# Patient Record
Sex: Female | Born: 1950 | Race: White | Hispanic: No | Marital: Married | Smoking: Never smoker
Health system: Southern US, Community
[De-identification: ages and names within clinical notes are randomized; demographics above are authoritative.]

## PROBLEM LIST (undated history)

## (undated) DIAGNOSIS — E039 Hypothyroidism, unspecified: Secondary | ICD-10-CM

## (undated) DIAGNOSIS — E785 Hyperlipidemia, unspecified: Secondary | ICD-10-CM

## (undated) DIAGNOSIS — K219 Gastro-esophageal reflux disease without esophagitis: Secondary | ICD-10-CM

## (undated) DIAGNOSIS — M1990 Unspecified osteoarthritis, unspecified site: Secondary | ICD-10-CM

## (undated) HISTORY — PX: COLONOSCOPY: SHX174

---

## 2003-10-18 IMAGING — MG MM ADDITIONAL VIEWS AT NO CHARGE
1 series · 3 of 3 positions shown · non-contrast
Comparison: none

Procedure: DIGITAL ADDITIONAL IMAGING OF THE LEFT BREAST:

 Magnification compression imaging of the LEFT breast was performed in the
region of the nodular density which projects within the inferior central
portion of the LEFT breast in approximately the 5 to 7 oclock position.
This area does not persist with magnification compression imaging consistent
with a benign finding.

[L ML · left · 3 of 3 slices shown]
[im 1/3]
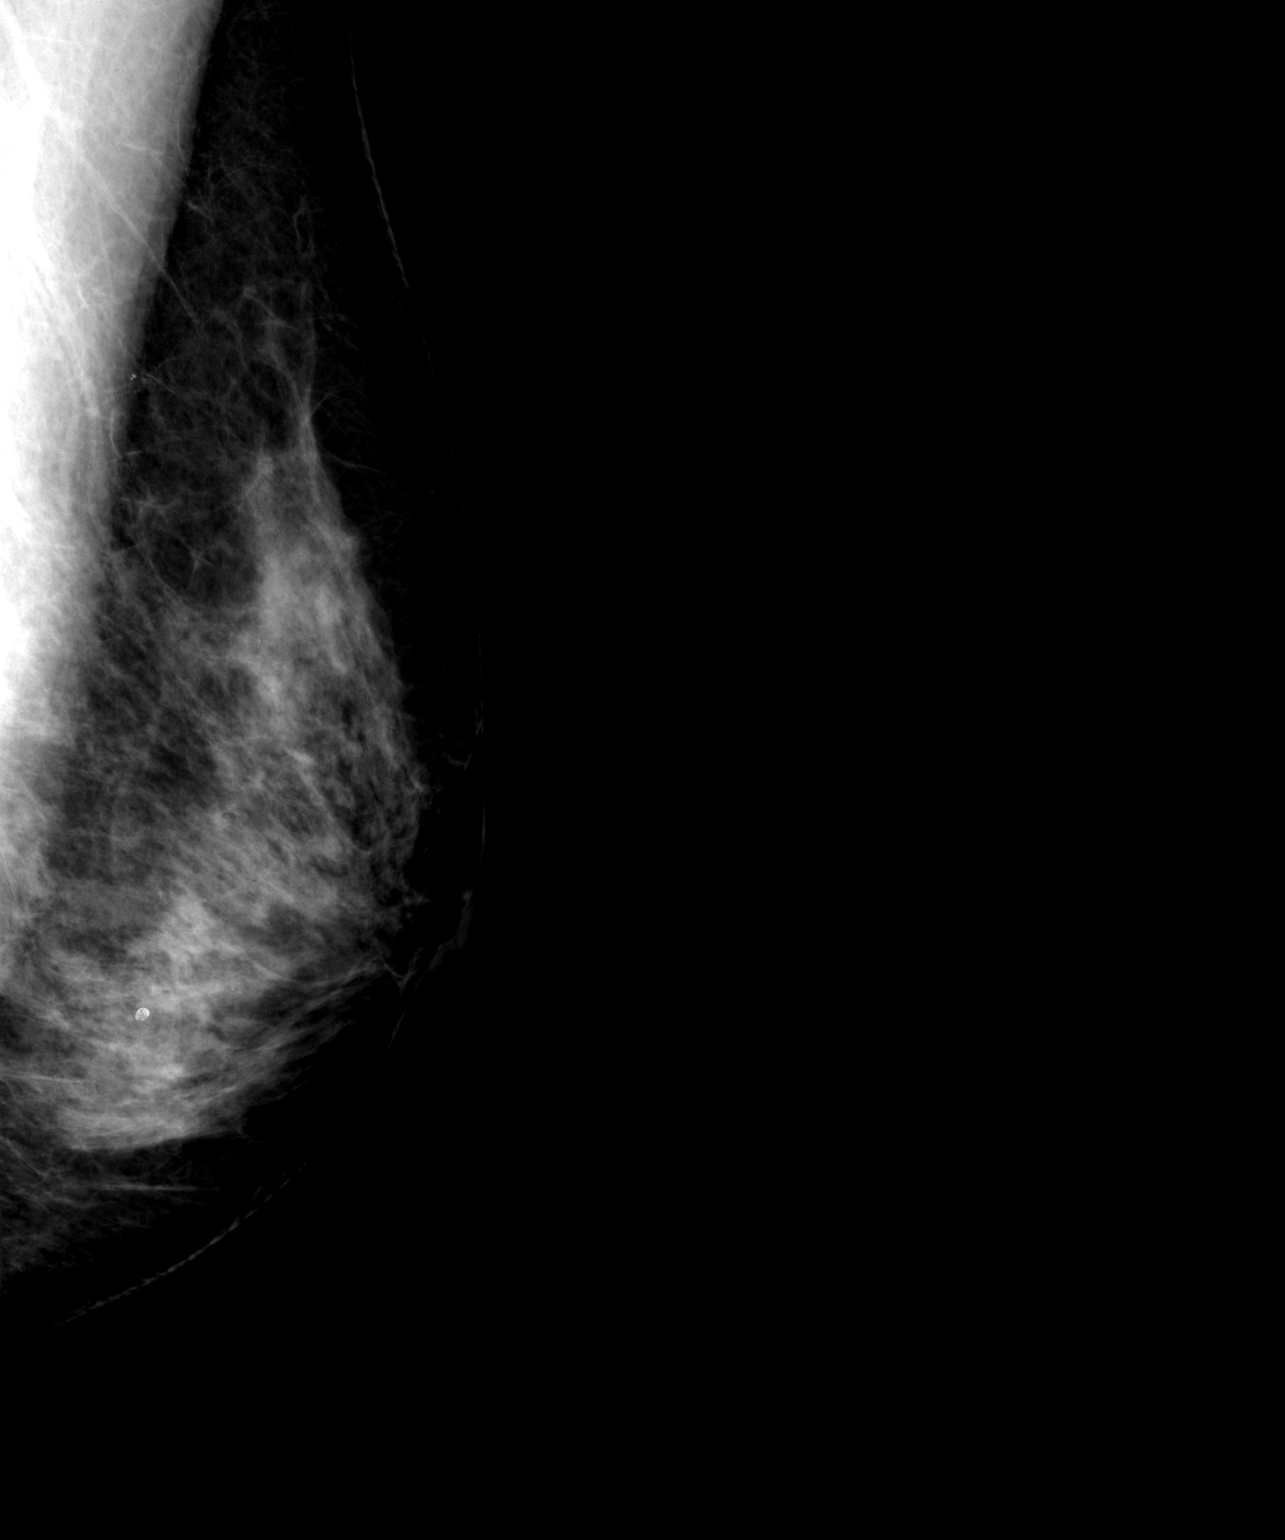
[im 2/3]
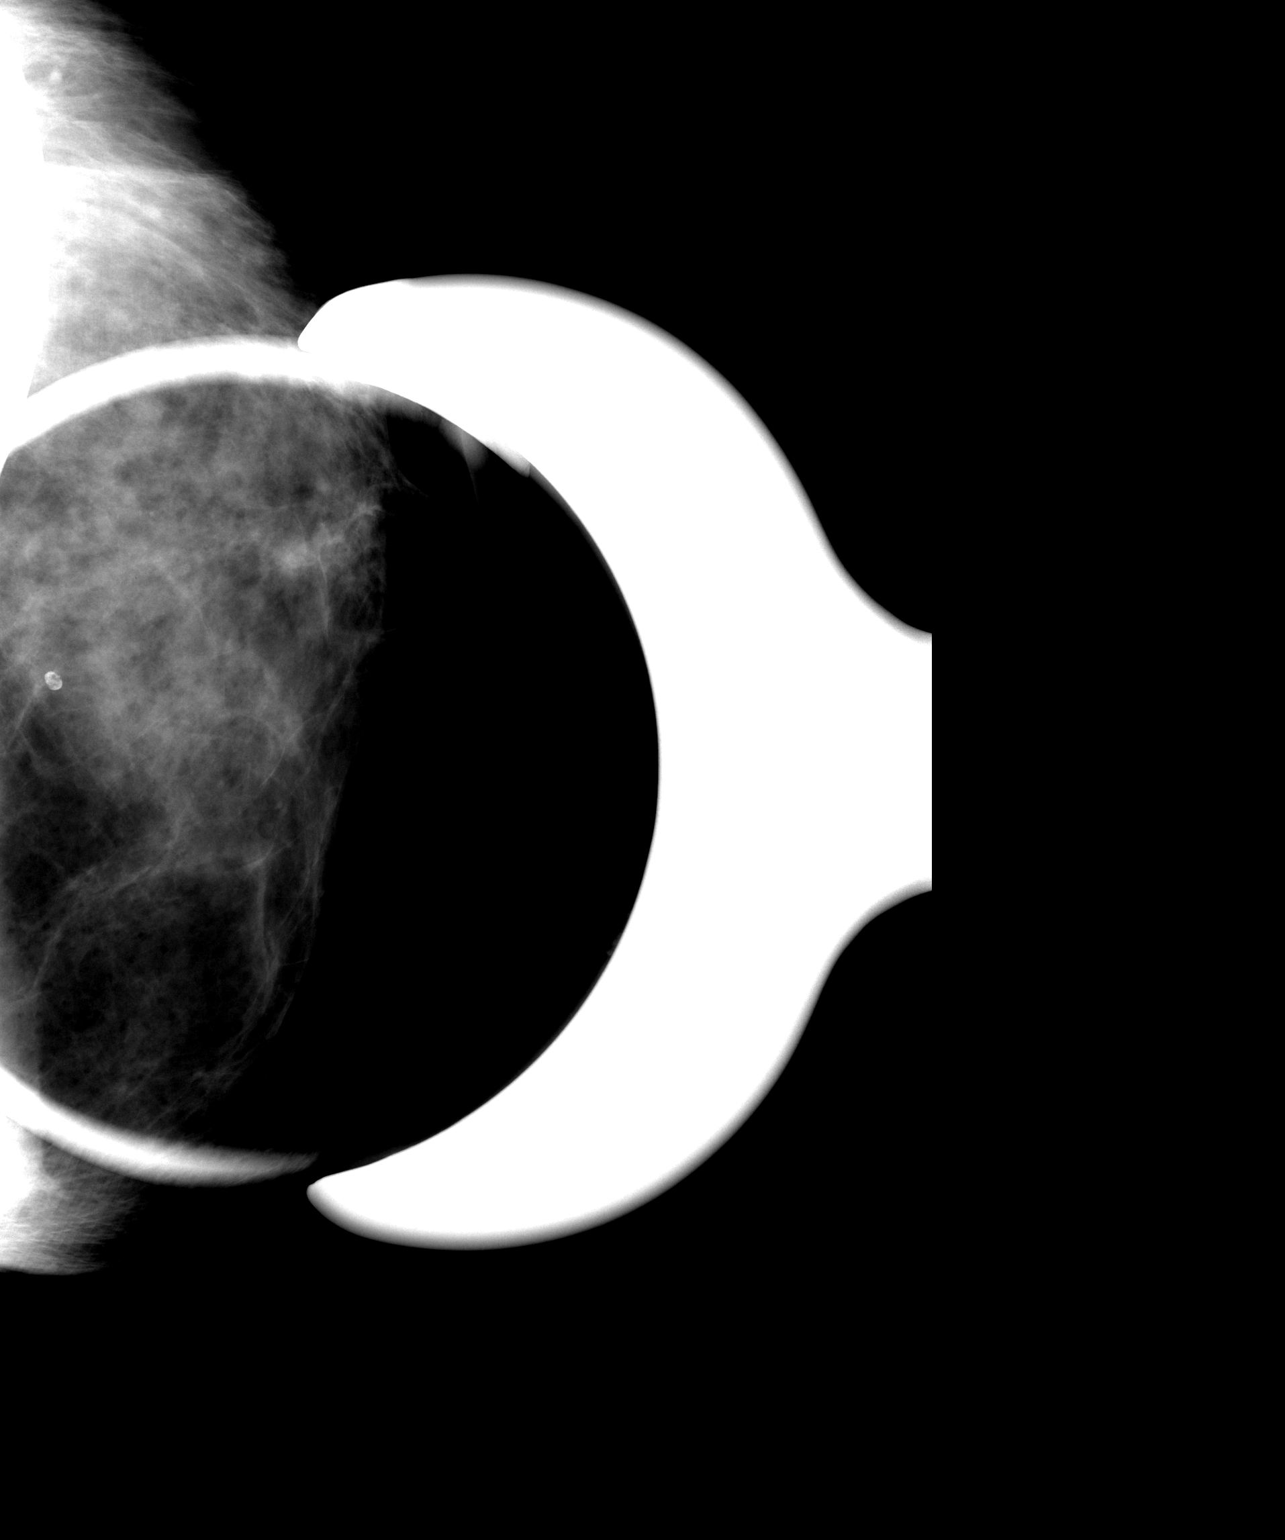
[im 3/3]
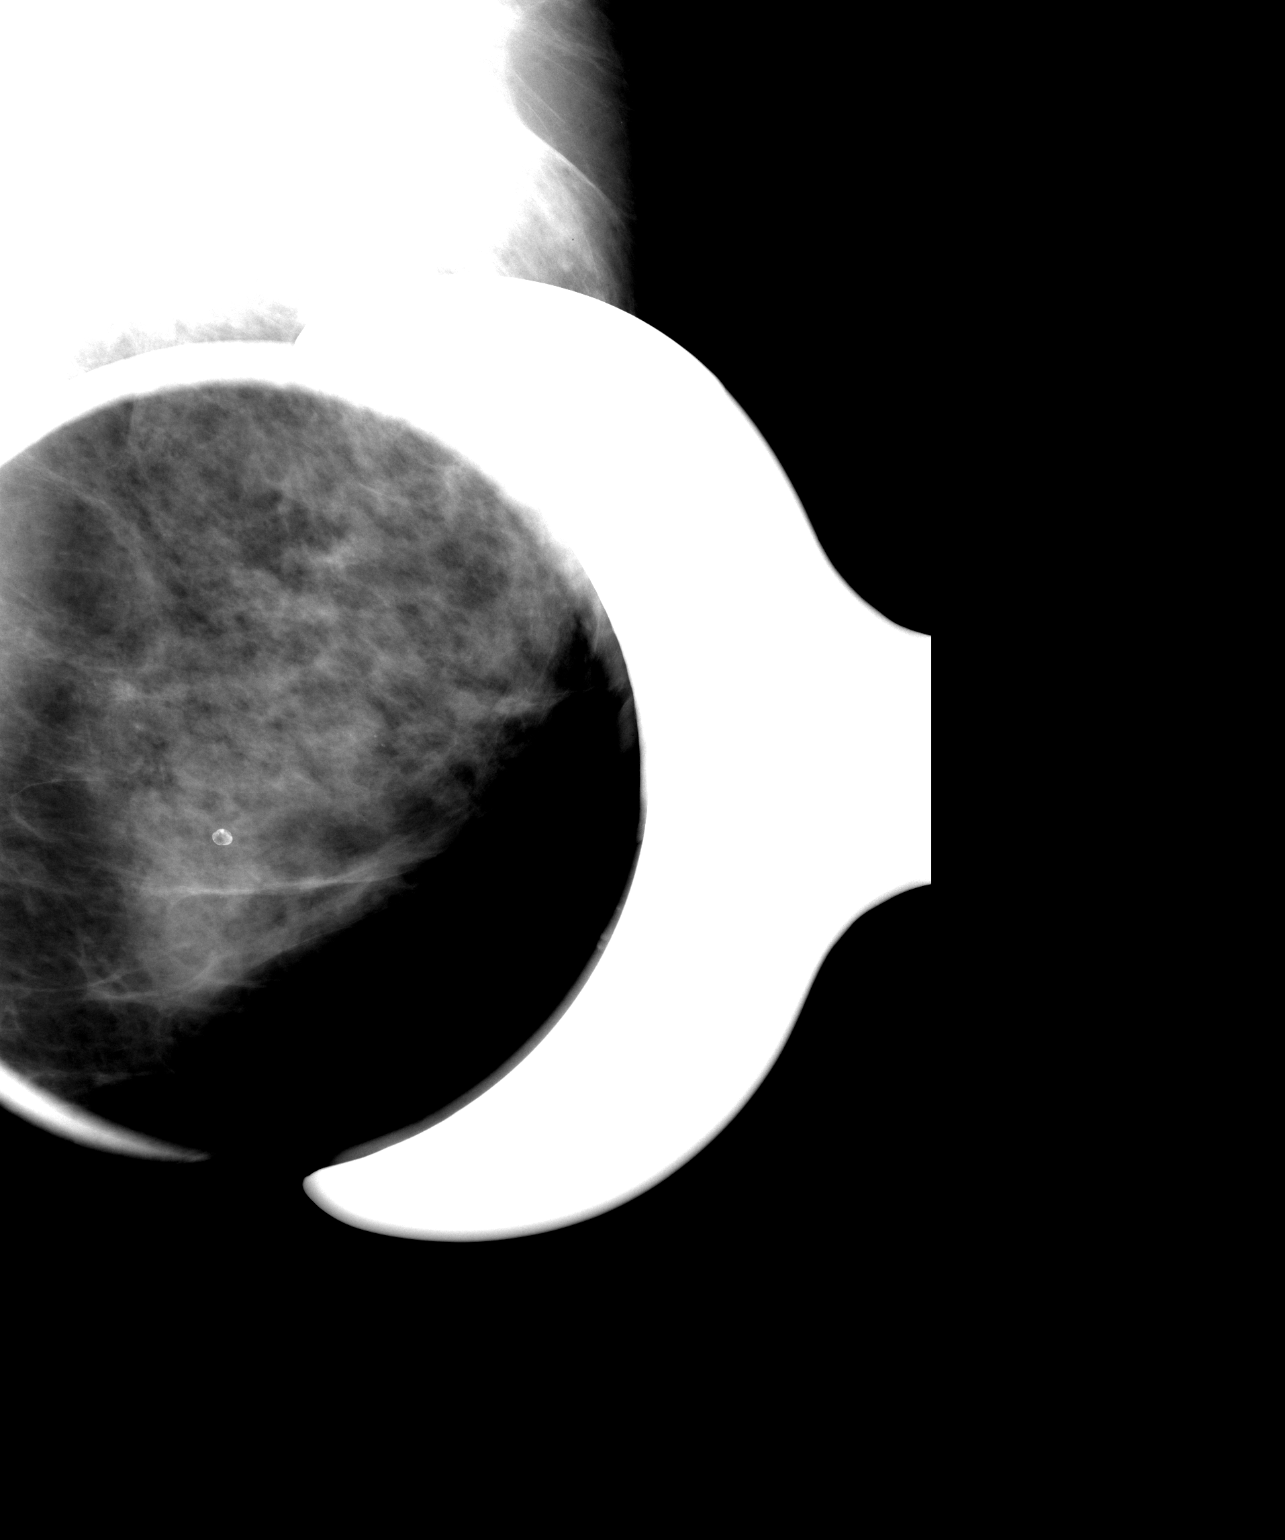

[3 of 3 positions shown; findings below may reference images not displayed]

IMPRESSION: 1)Benign findings.

 2)BI-RADS: Category 2-Benign Finding

 A NEGATIVE MAMMOGRAM REPORT DOES NOT PRECLUDE BIOPSY OR OTHER EVALUATION OF
A CLINICALLY PALPABLE OR OTHERWISE SUSPICIOUS MASS OR LESION. BREAST CANCER
MAY NOT BE DETECTED BY MAMMOGRAPHY IN UP TO 10% OF CASES.

## 2004-11-12 ENCOUNTER — Ambulatory Visit: Payer: Self-pay | Admitting: Family Medicine

## 2004-11-12 IMAGING — MG MM CAD SCREENING MAMMO
1 series · 4 of 4 positions shown · non-contrast
Comparison: none

REASON FOR EXAM: screening
COMMENTS:

[R CC · right · 4 of 4 slices shown]
[im 1/4]
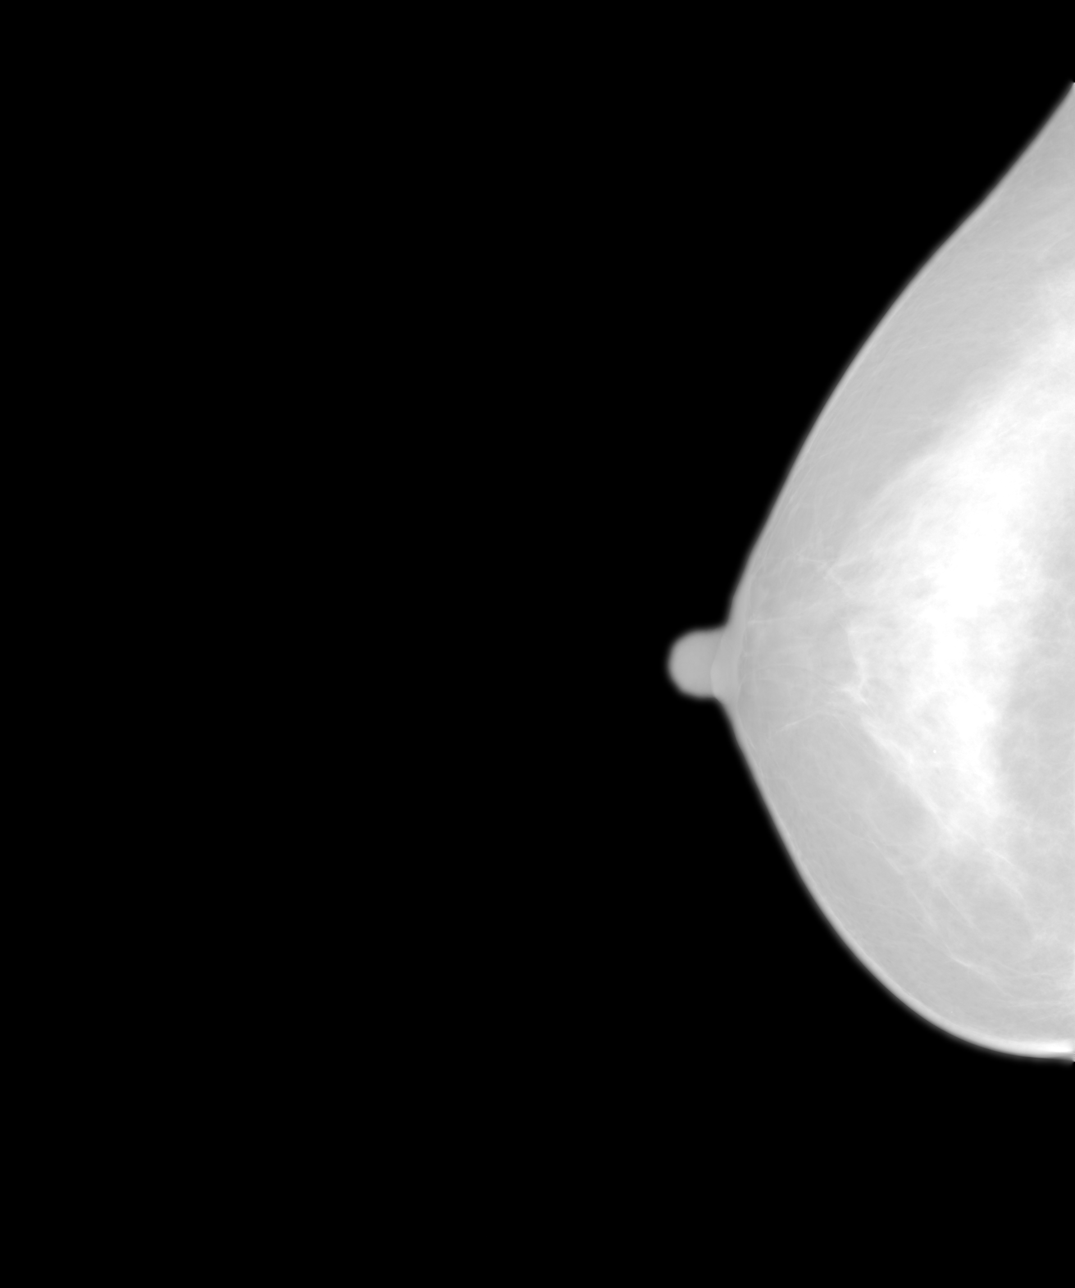
[im 2/4]
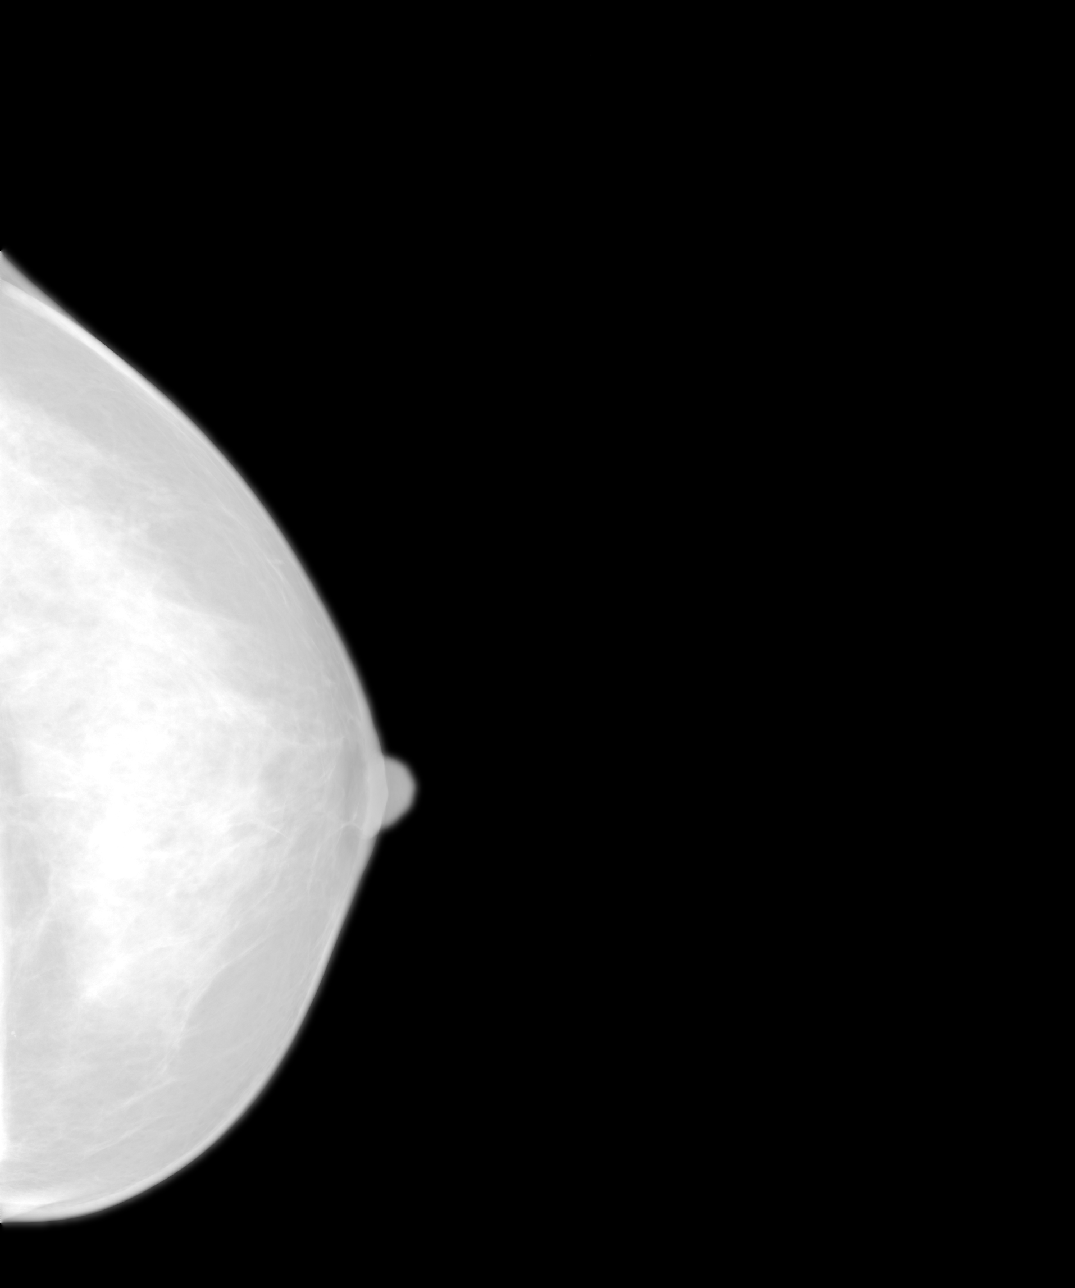
[im 3/4]
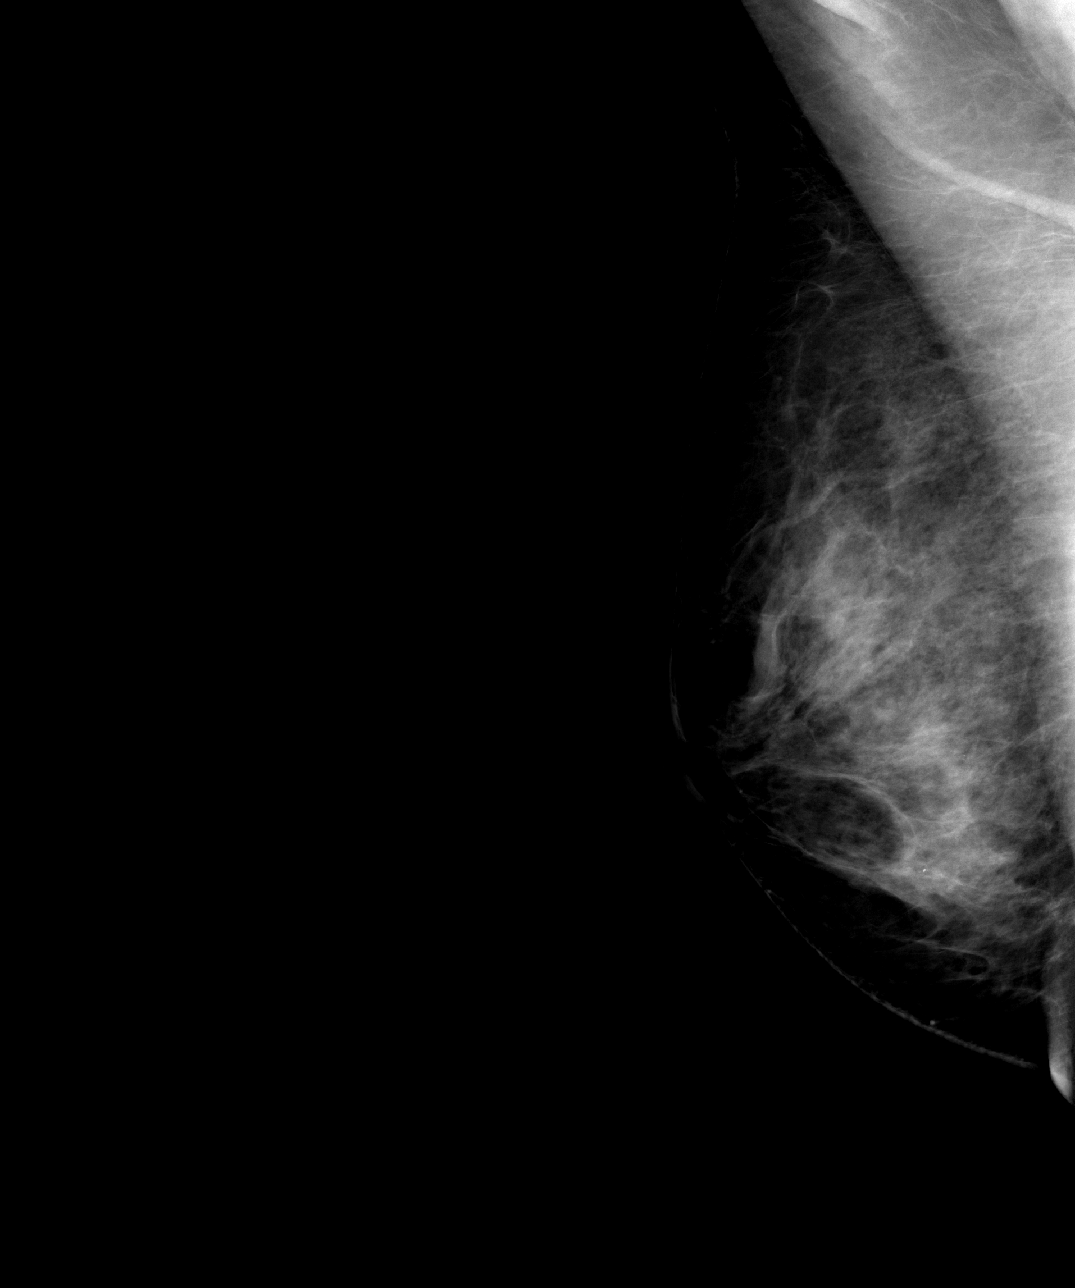
[im 4/4]
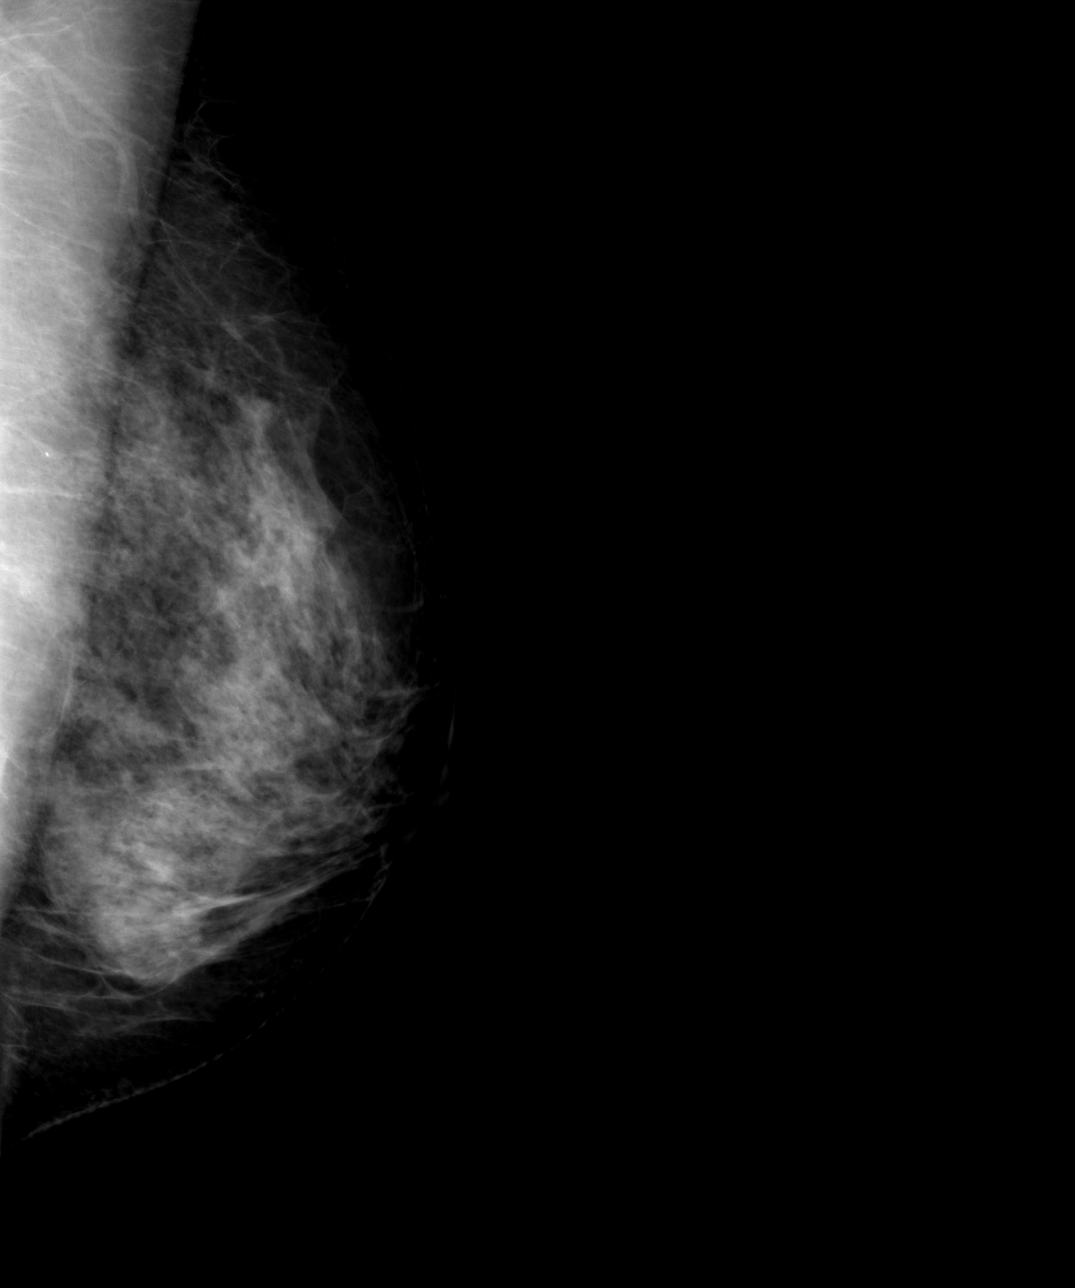

[4 of 4 positions shown; findings below may reference images not displayed]

PROCEDURE:     MAM - MAM DGTL SCREENING MAMMO W/CAD  - [DATE]  [DATE]

RESULT:       Routine images were obtained and compared to prior digital
exam of [DATE] and film screen of [DATE].

The breasts appear relatively dense bilaterally.  No dominant masses are
noted.  No suspicious microcalcifications are seen.  No areas of
architectural distortion are noted.
IMPRESSION: 1.     The breasts are stable.
2.     Continued annual follow up recommended.
3.     BI-RADS: Category 2-Benign Findings.

A NEGATIVE MAMMOGRAM REPORT DOES NOT PRECLUDE BIOPSY OR OTHER EVALUATION OF
A CLINICALLY PALPABLE OR OTHERWISE SUSPICIOUS MASS OR LESION.   BREAST

## 2006-06-16 ENCOUNTER — Ambulatory Visit: Payer: Self-pay | Admitting: Family Medicine

## 2006-06-16 IMAGING — MG MM CAD SCREENING MAMMO
1 series · 4 of 4 positions shown · non-contrast
Comparison: none

REASON FOR EXAM: Screening mammogram
COMMENTS:

PROCEDURE:     MAM - MAM DGTL SCREENING MAMMO W/CAD  - [DATE]  [DATE]
RESULT:        Comparison is made to prior studies dated [DATE] and
[DATE].
The breasts demonstrate a moderate heterogeneous parenchymal pattern.  There
does not appear to be radiographic evidence to suggest malignancy.

[R CC · right · 4 of 4 slices shown]
[im 1/4]
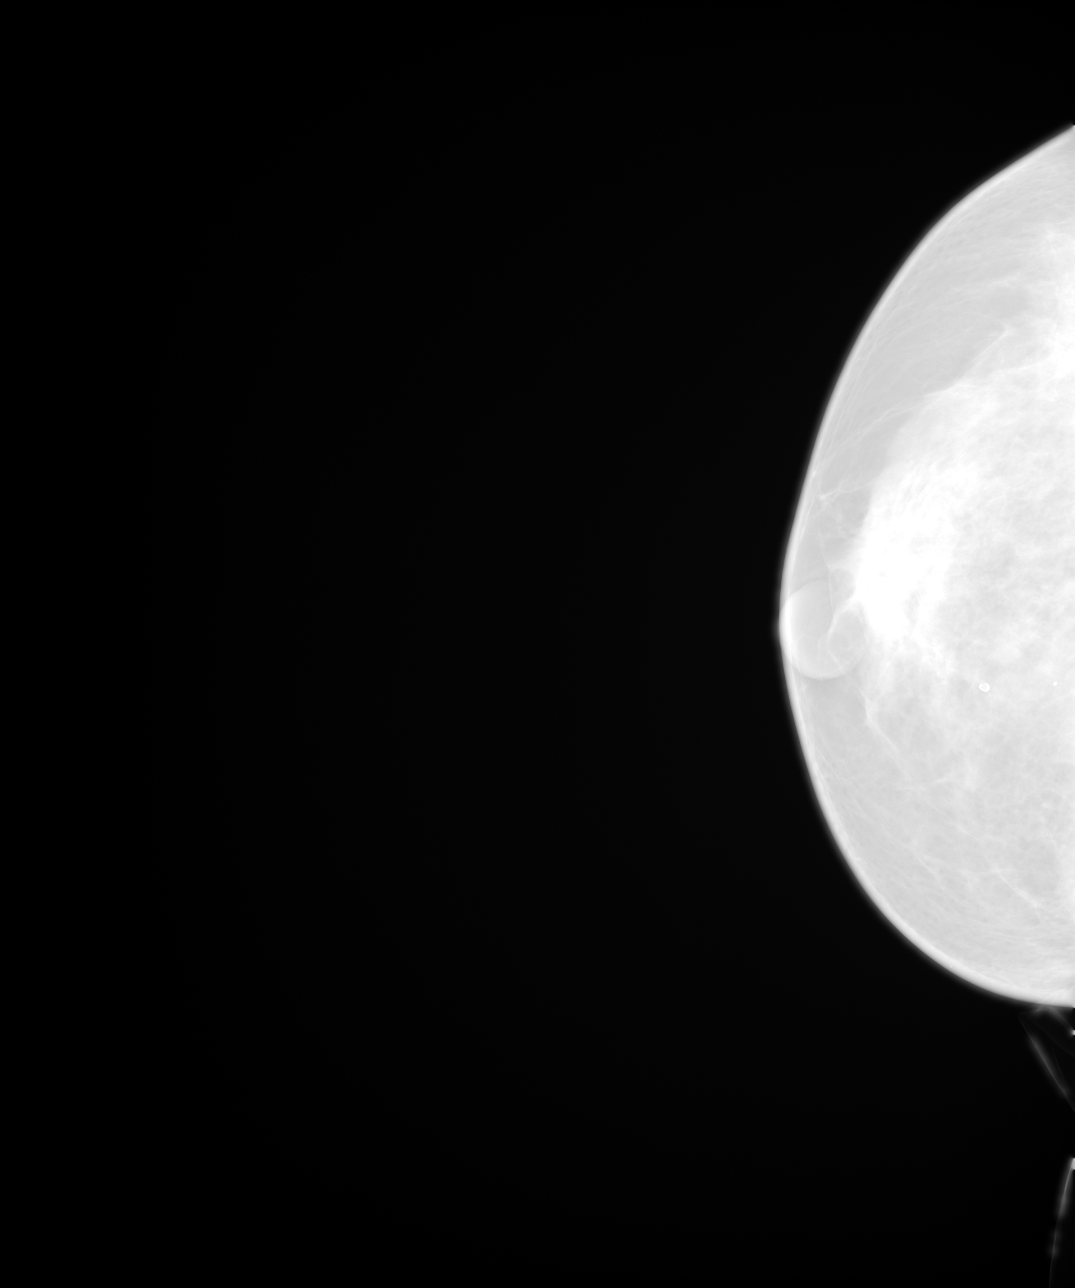
[im 2/4]
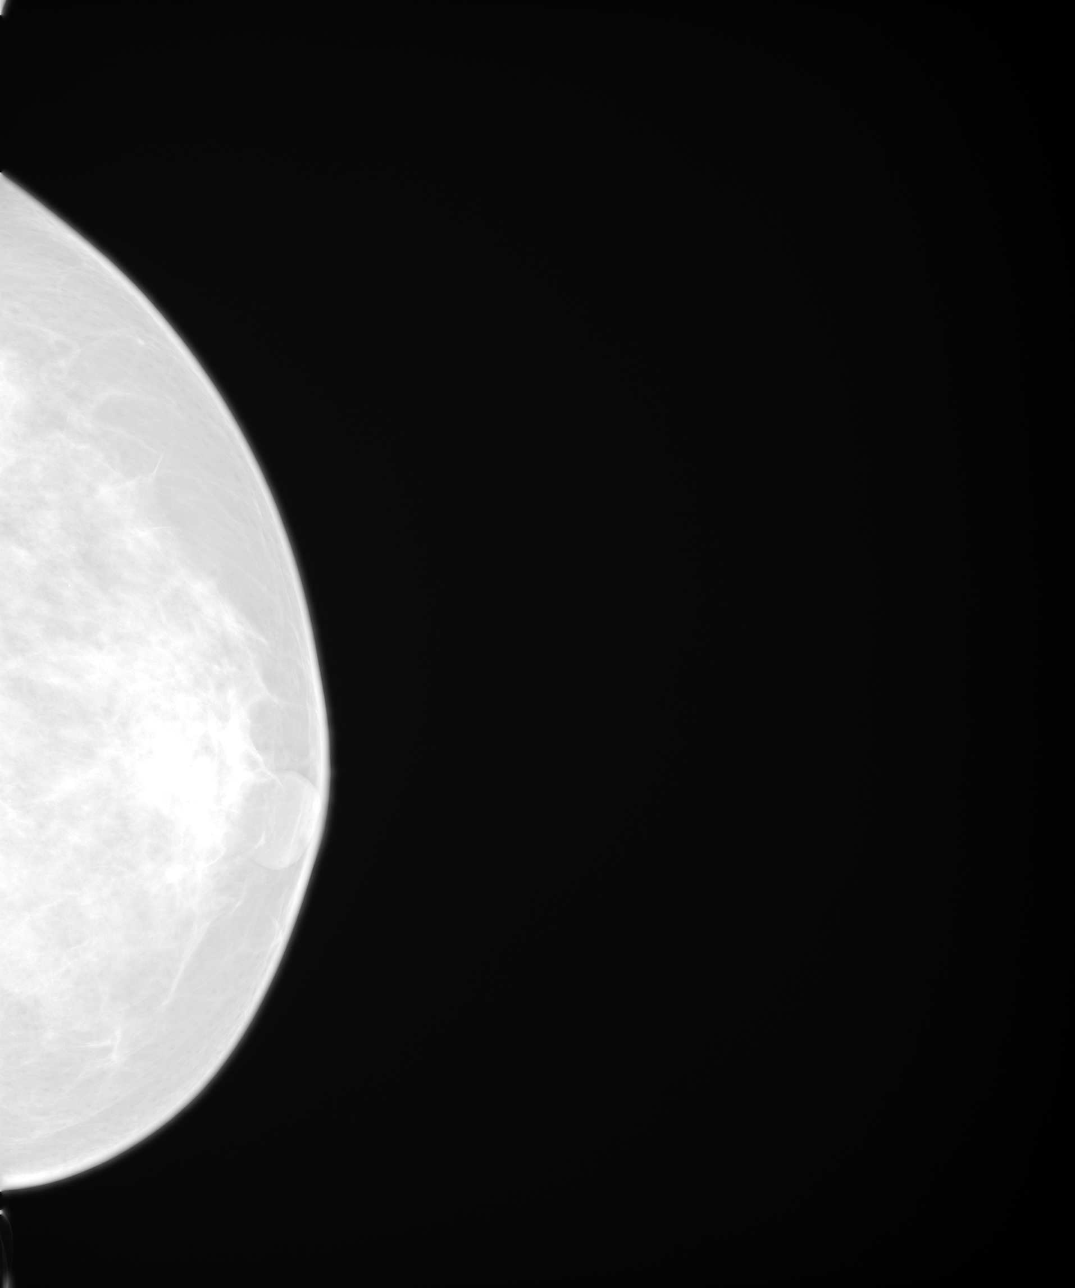
[im 3/4]
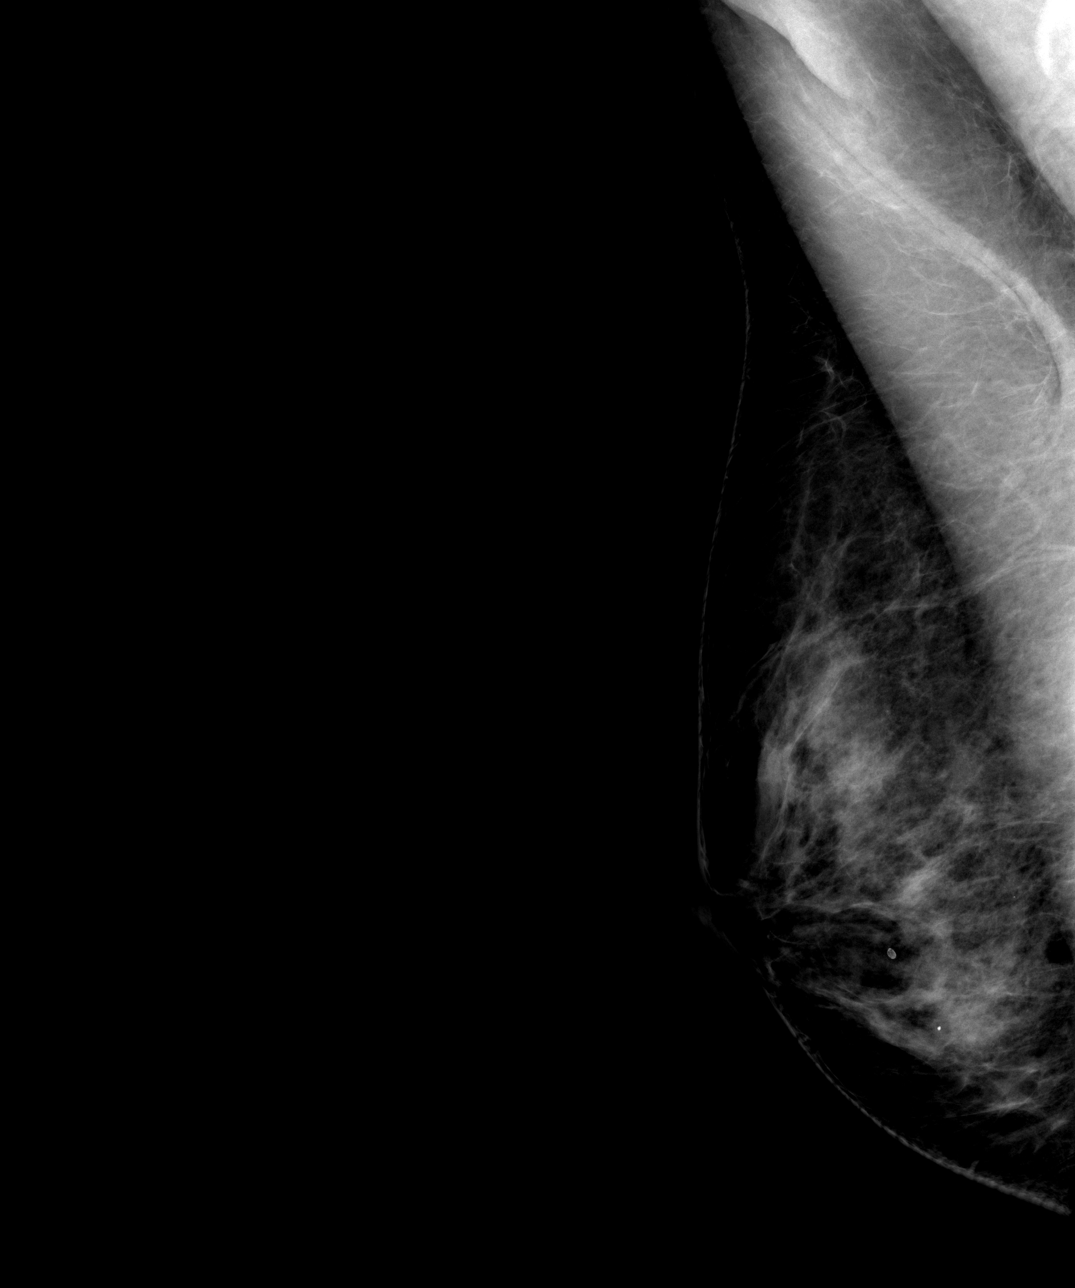
[im 4/4]
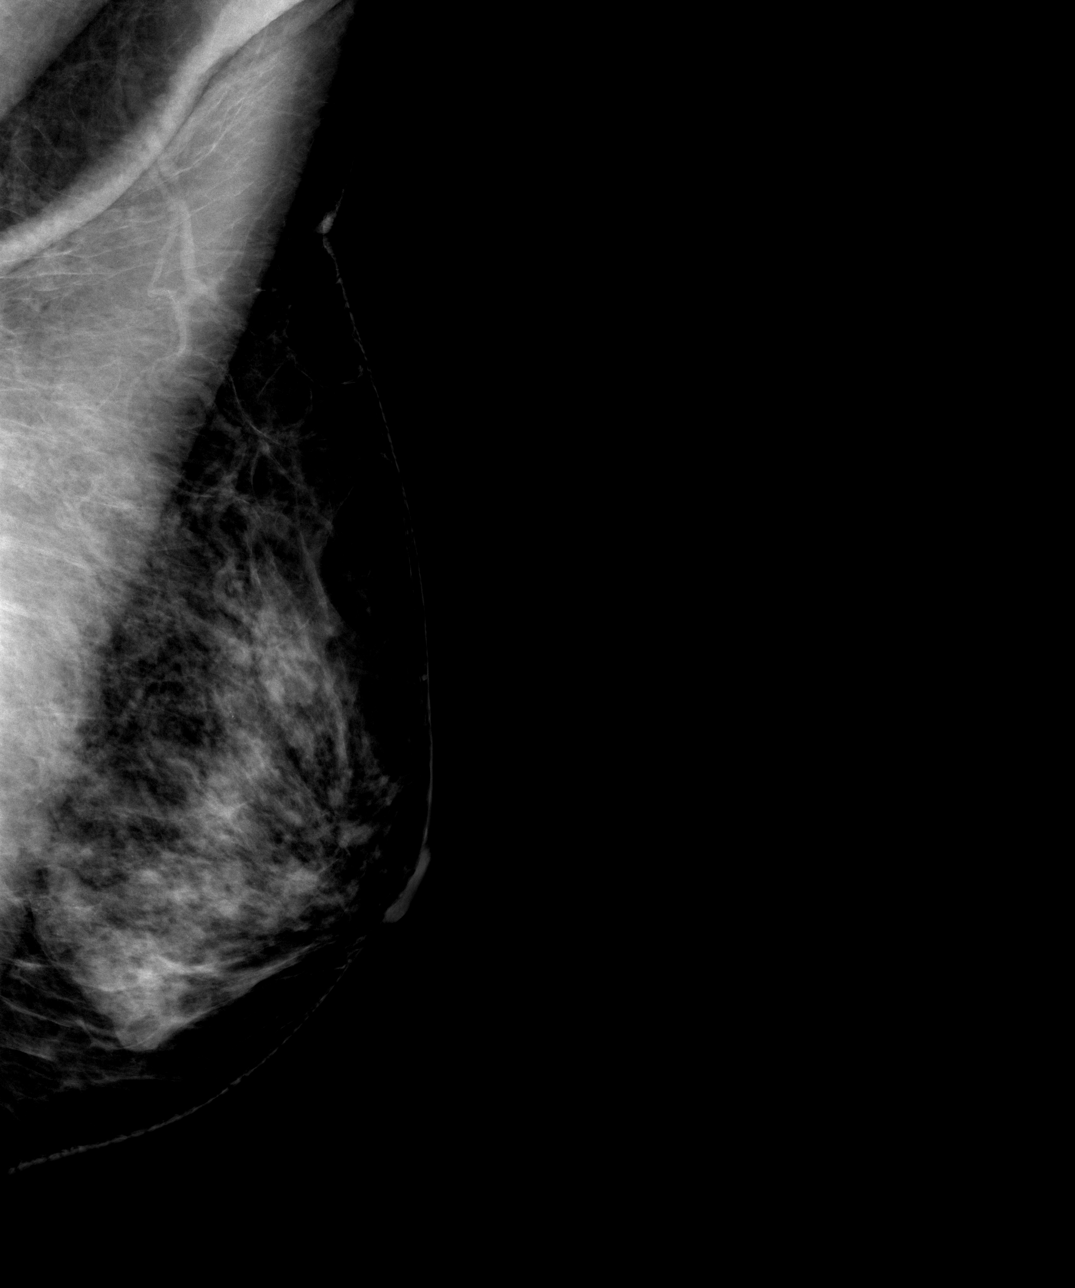

[4 of 4 positions shown; findings below may reference images not displayed]

IMPRESSION: 1.     Benign findings.
2.     BI-RADS:  Category 2- Benign Finding.

A NEGATIVE MAMMOGRAM REPORT DOES NOT PRECLUDE BIOPSY OR OTHER EVALUATION OF
A CLINICALLY PALPABLE OR OTHERWISE SUSPICOUS MASS OR LESION.  BREAST CANCER
MAY NOT BE DETECTED BY MAMMOGRAPHY IN UP TO 10% OF CASES.

## 2007-10-10 ENCOUNTER — Ambulatory Visit: Payer: Self-pay | Admitting: Family Medicine

## 2007-10-10 IMAGING — MG MM CAD SCREENING MAMMO
1 series · 4 of 4 positions shown · non-contrast
Comparison: none

REASON FOR EXAM: Screening mammogram
COMMENTS:

PROCEDURE:     MAM - MAM DGTL SCREENING MAMMO W/CAD  - [DATE]  [DATE]
RESULT:     Comparison is made to prior exams of [DATE] and [DATE].

[R CC · right · 4 of 4 slices shown]
[im 1/4]
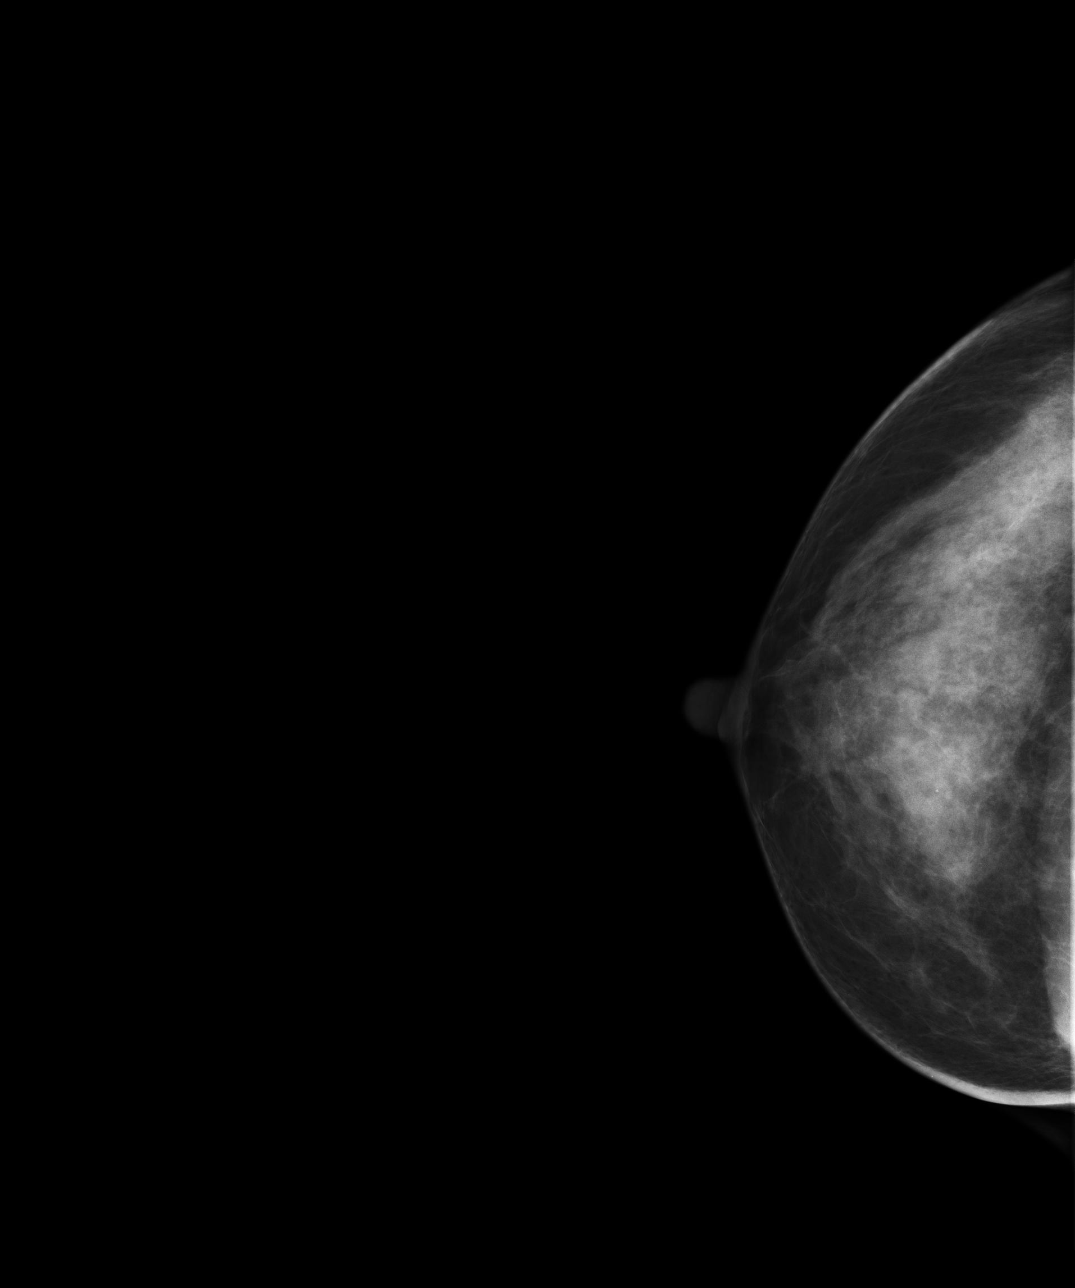
[im 2/4]
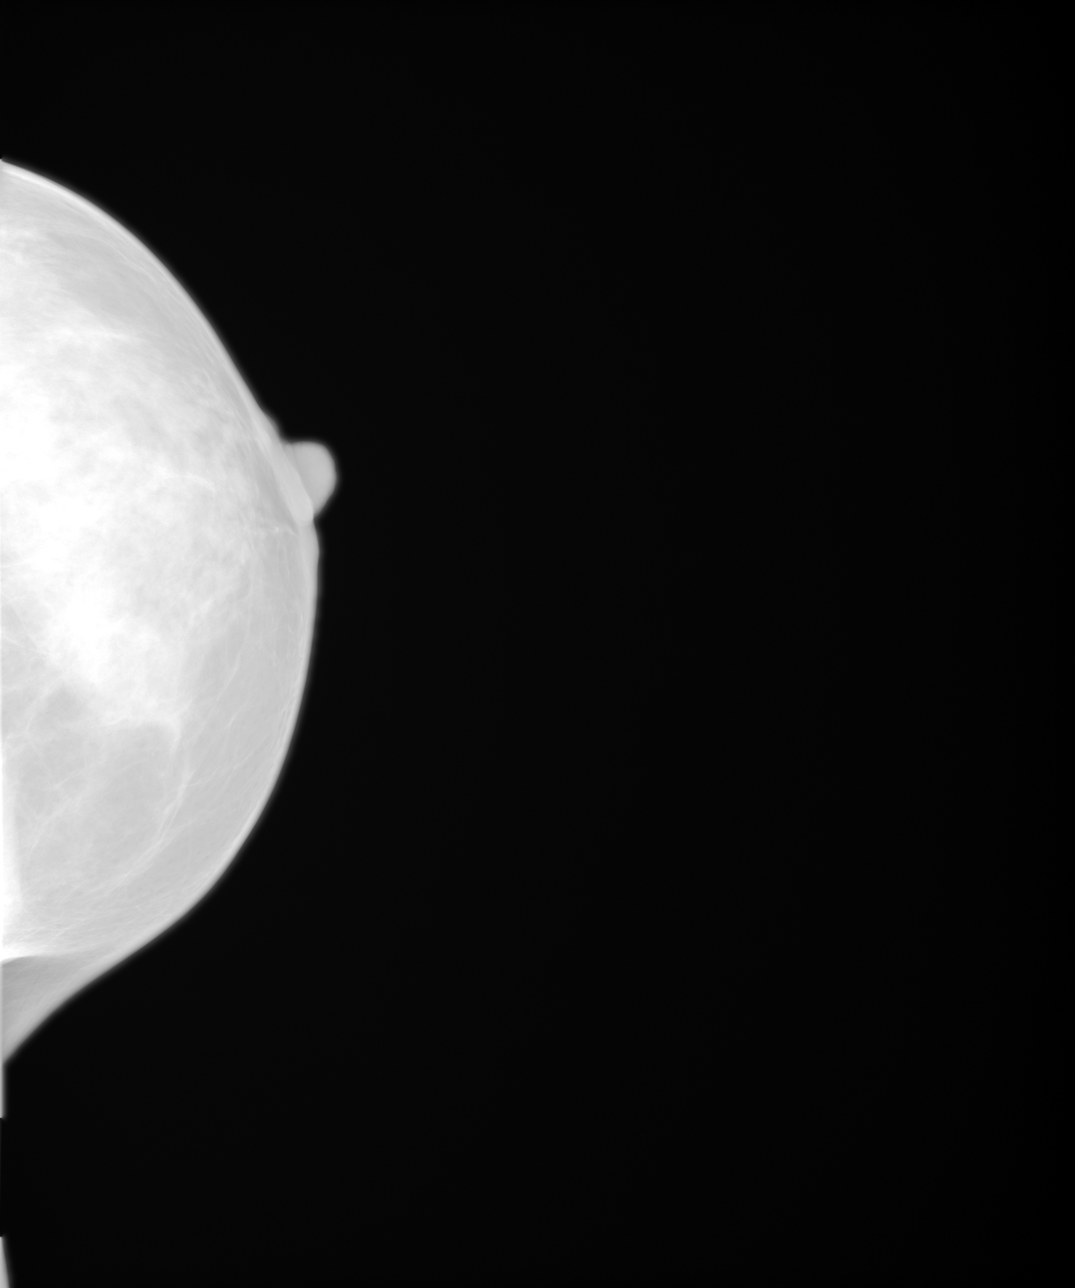
[im 3/4]
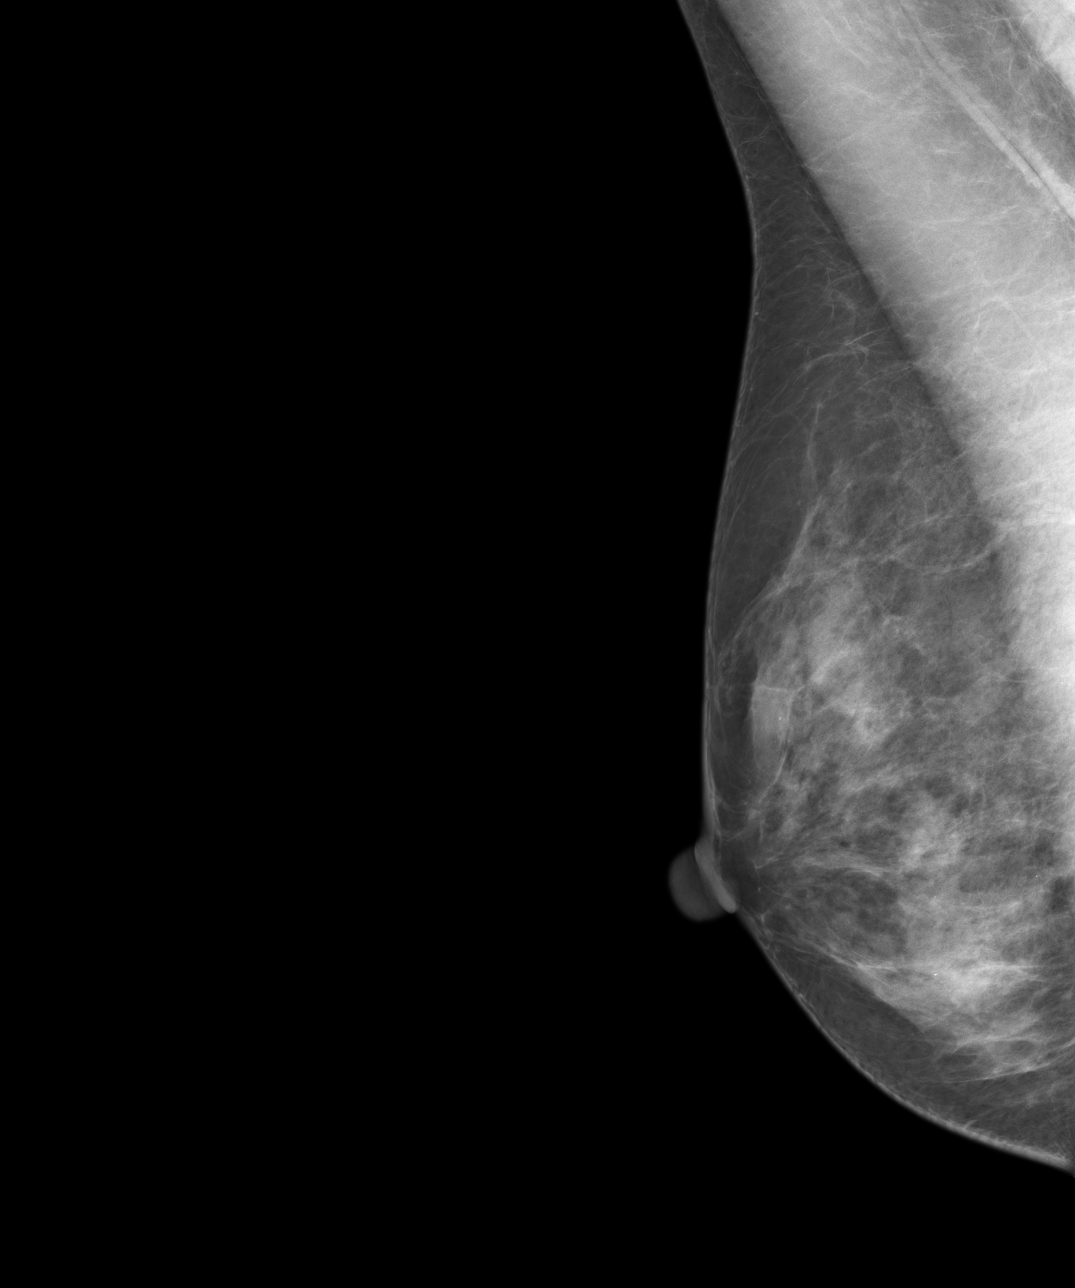
[im 4/4]
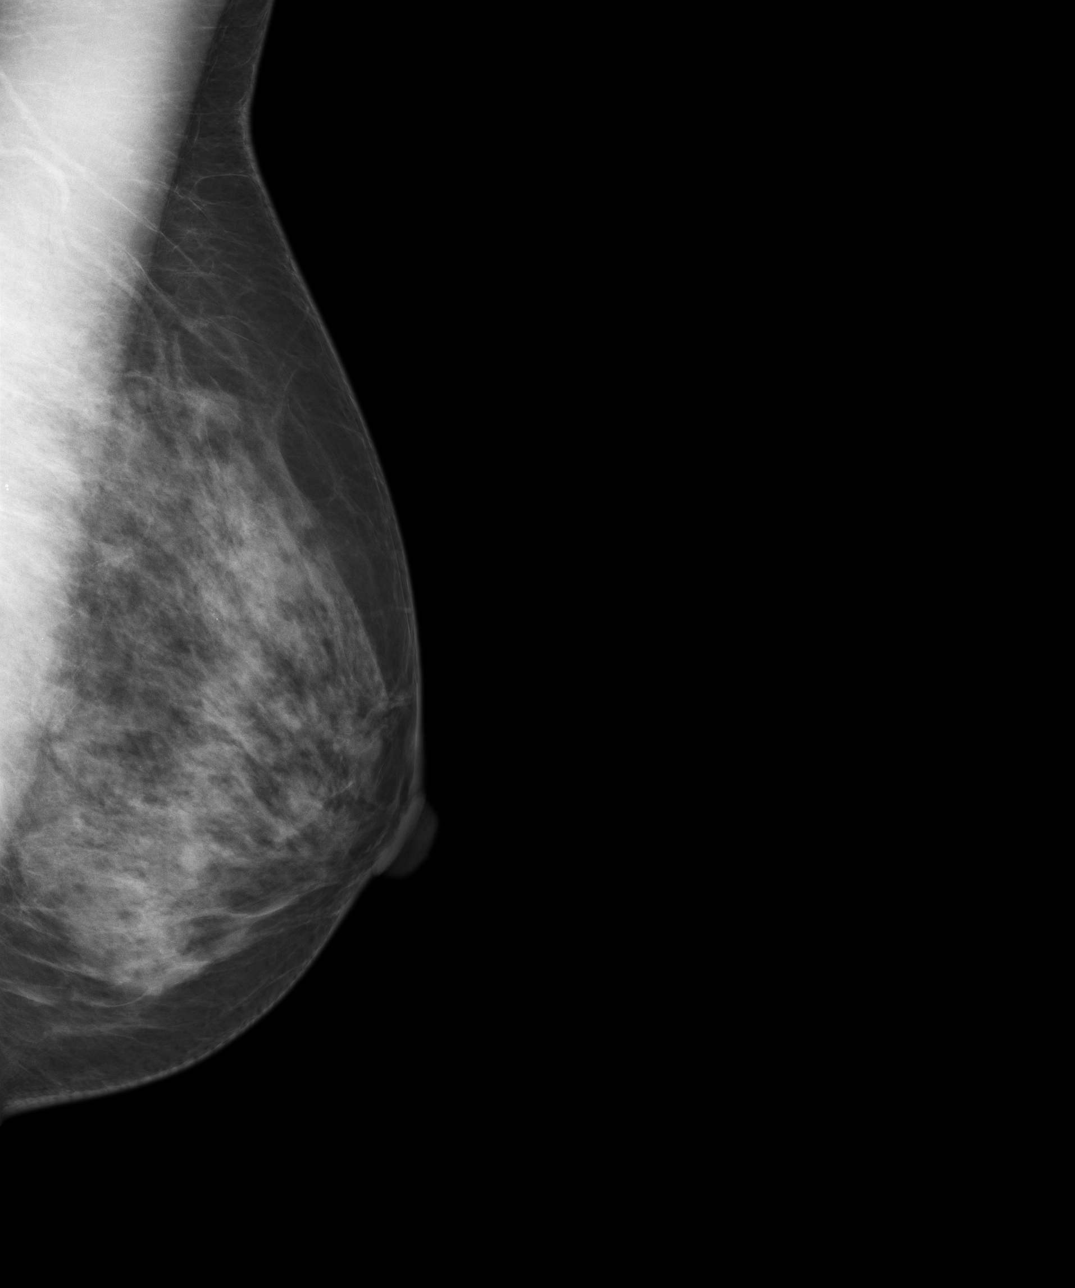

[4 of 4 positions shown; findings below may reference images not displayed]

FINDINGS: Heterogeneously dense bilateral breast parenchyma lowers
sensitivity for mammography. A few, scattered, benign appearing bilateral
breast calcifications are noted. No new suspicious calcifications or
suspicious mass.
IMPRESSION: 1.     Benign findings.
2.     Recommend routine follow-up bilateral mammogram in one year.
3.     BI-RADS: Category 2 - Benign Finding.

Thank you for this opportunity to contribute to the care of your patient.

A NEGATIVE MAMMOGRAM REPORT DOES NOT PRECLUDE BIOPSY OR OTHER EVALUATION OF
A CLINICALLY PALPABLE OR OTHERWISE SUSPICIOUS MASS OR LESION. BREAST CANCER
MAY NOT BE DETECTED BY MAMMOGRAPHY IN UP TO 10% OF CASES.

## 2008-12-19 ENCOUNTER — Ambulatory Visit: Payer: Self-pay | Admitting: Family Medicine

## 2008-12-19 IMAGING — MG MM CAD SCREENING MAMMO
1 series · 4 of 4 positions shown · non-contrast
Comparison: none

REASON FOR EXAM: scr mammo
COMMENTS:

[R CC · right · 4 of 4 slices shown]
[im 1/4]
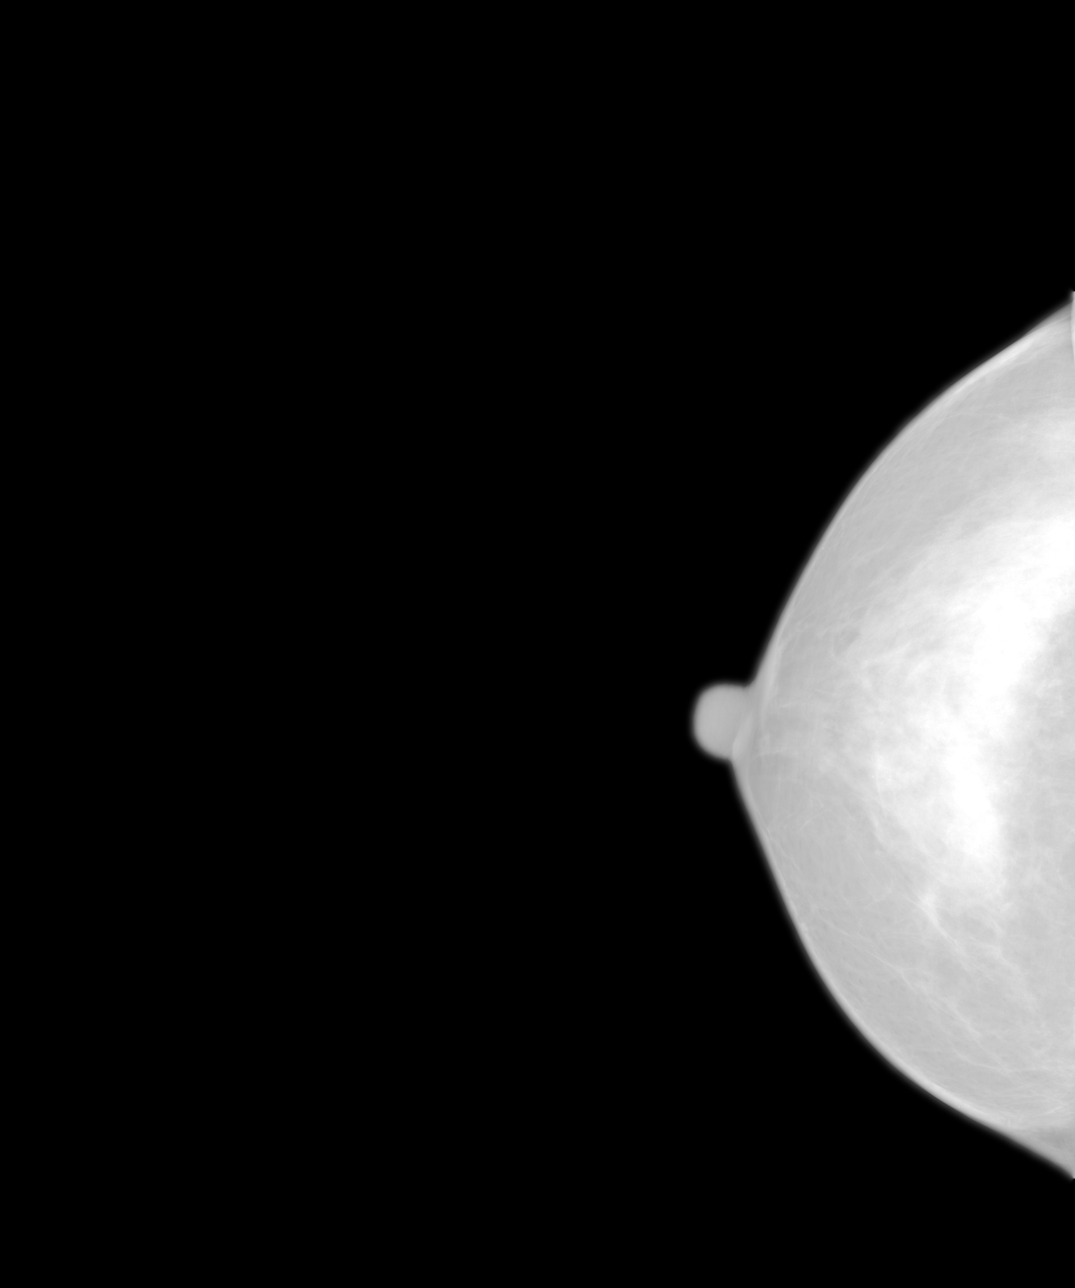
[im 2/4]
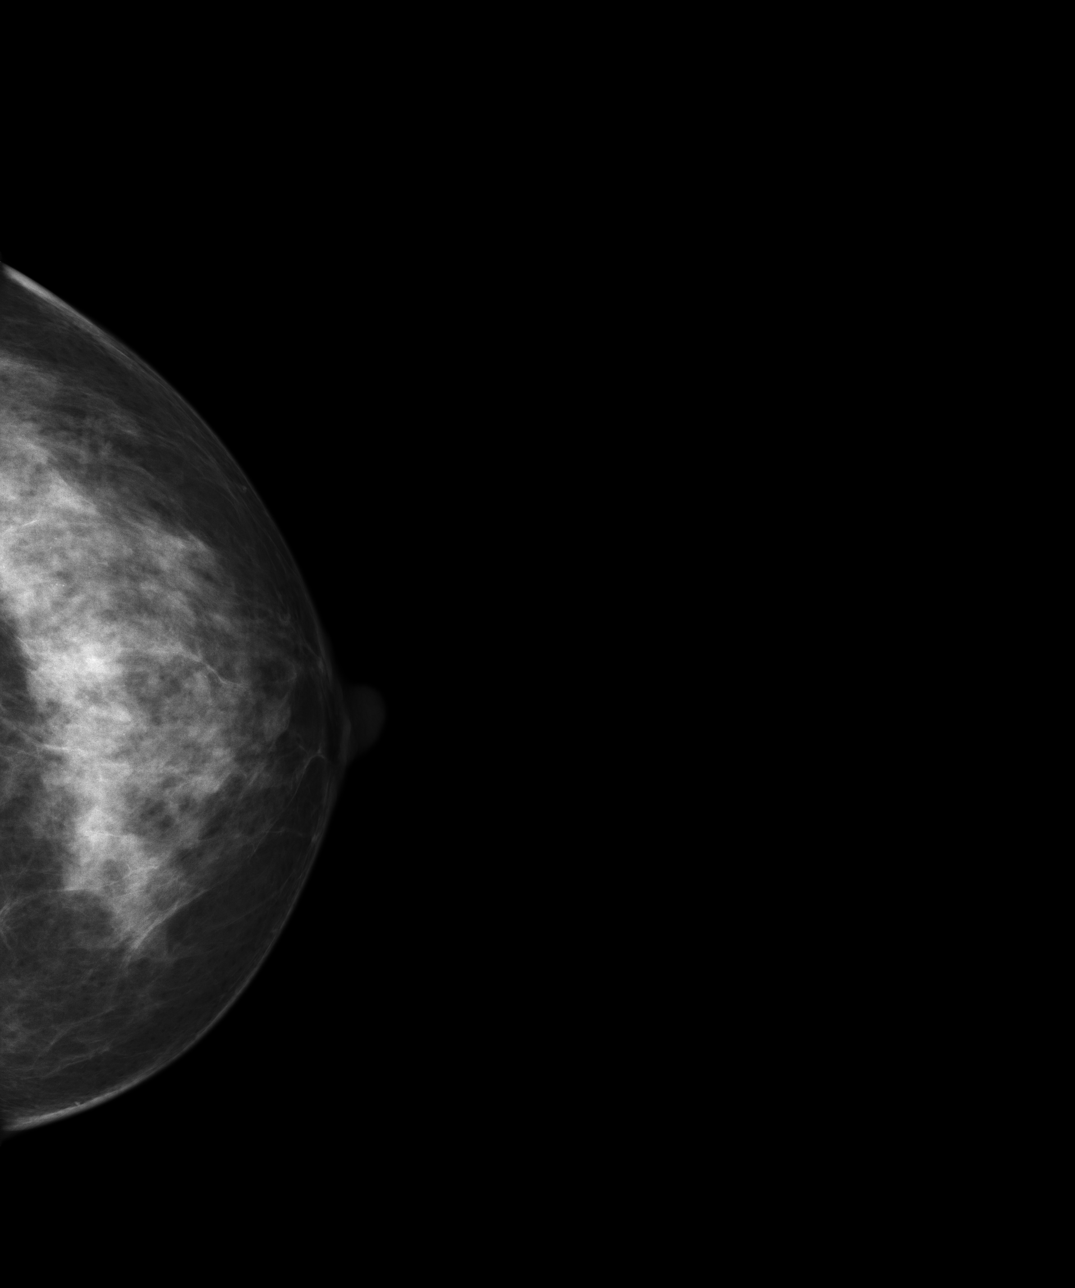
[im 3/4]
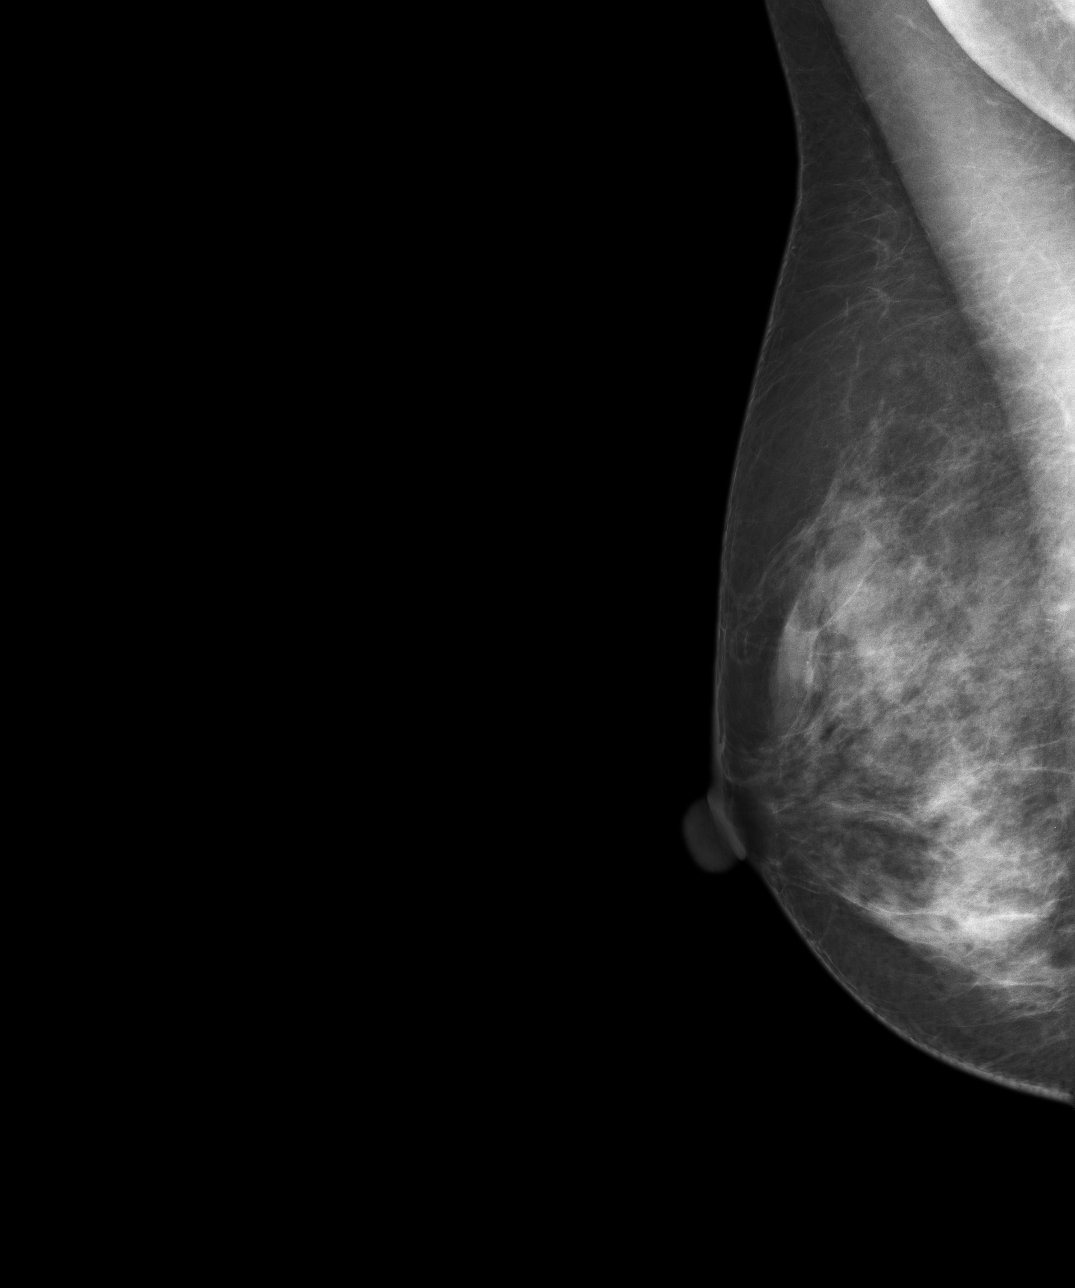
[im 4/4]
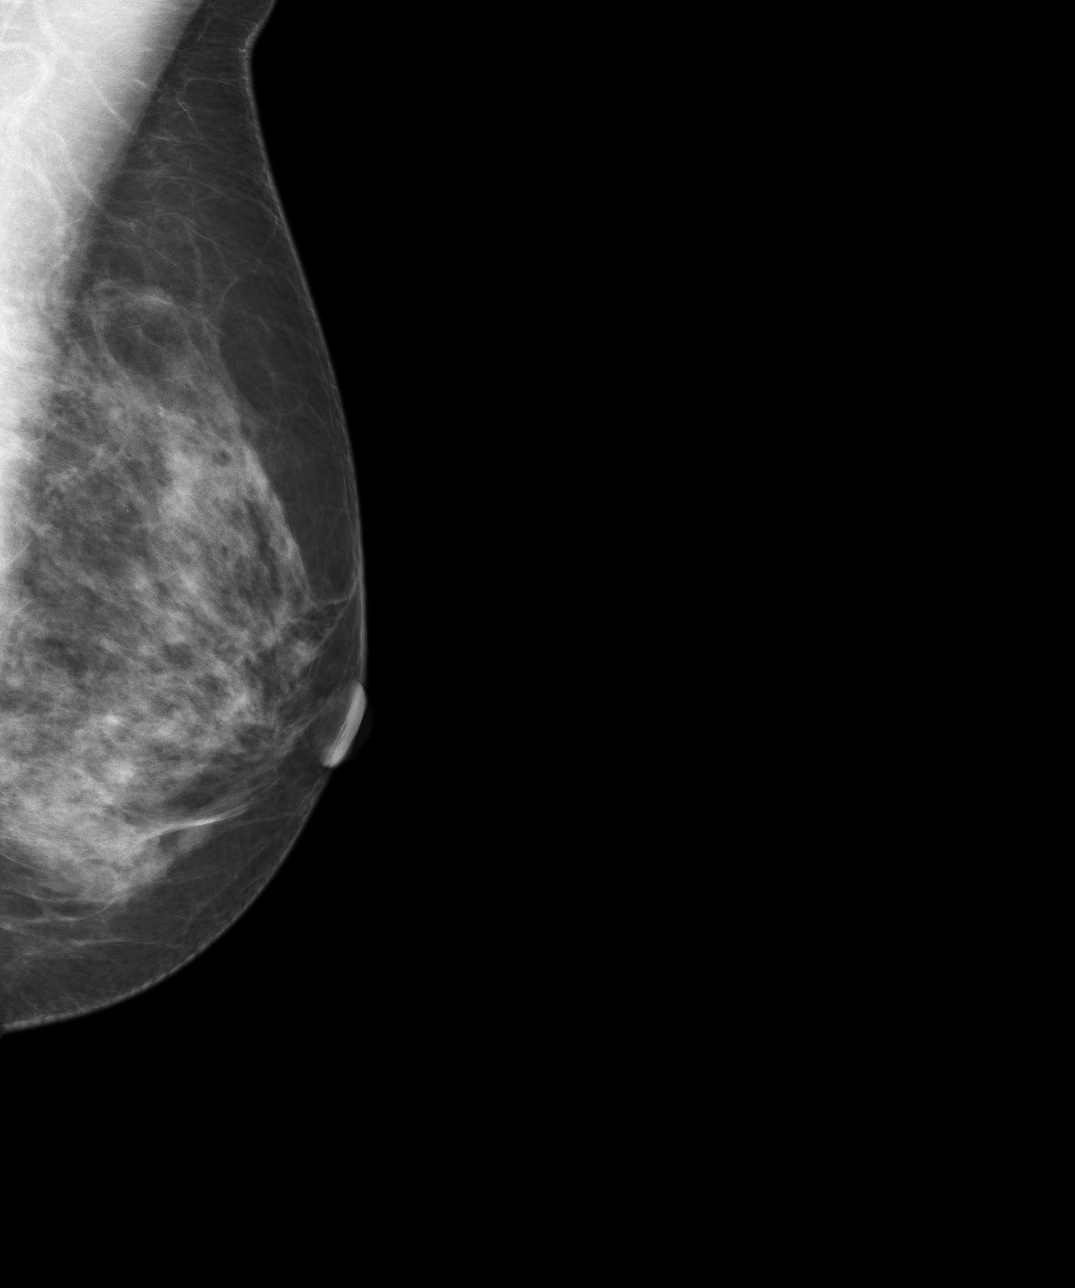

[4 of 4 positions shown; findings below may reference images not displayed]

PROCEDURE:     MAM - MAM DGTL SCREENING MAMMO W/CAD  - [DATE]  [DATE]

RESULT:       Nodular density is noted in the retroareolar portion of the
left breast. This is marked on the digital images. Compression spot film and
if need be ultrasound suggested for further evaluation. No other
abnormalities are identified.  CAD evaluation is nonfocal. No pathologic
microcalcification is noted.
IMPRESSION: 1.       Scattered benign calcifications are present bilaterally. These are
stable.
2.      Nodular parenchymal density in the retroareolar portion of the left
breast for which compression spot films and if need be ultrasound suggested
for further evaluation.
3.     BI-RADS:  Category 0- Needs Additional Imaging.

A negative mammogram report does not preclude biopsy or other evaluation of
a clinically palpable or otherwise suspicious mass or lesion.  Breast cancer
may not be detected by mammography in up to 10% of cases.

## 2009-01-02 ENCOUNTER — Ambulatory Visit: Payer: Self-pay | Admitting: Family Medicine

## 2009-01-02 IMAGING — MG MM ADDITIONAL VIEWS AT NO CHARGE
1 series · 2 of 2 positions shown · non-contrast
Comparison: none

REASON FOR EXAM: left parenchymal density
COMMENTS:

PROCEDURE:     MAM - MAM DGTL ADD VW LT  SCR  - [DATE]  [DATE]
RESULT:     Additional views of the left breast reveal clearing of
previously identified retroareolar nodular density.

[L ML · left · 2 of 2 slices shown]
[im 1/2]
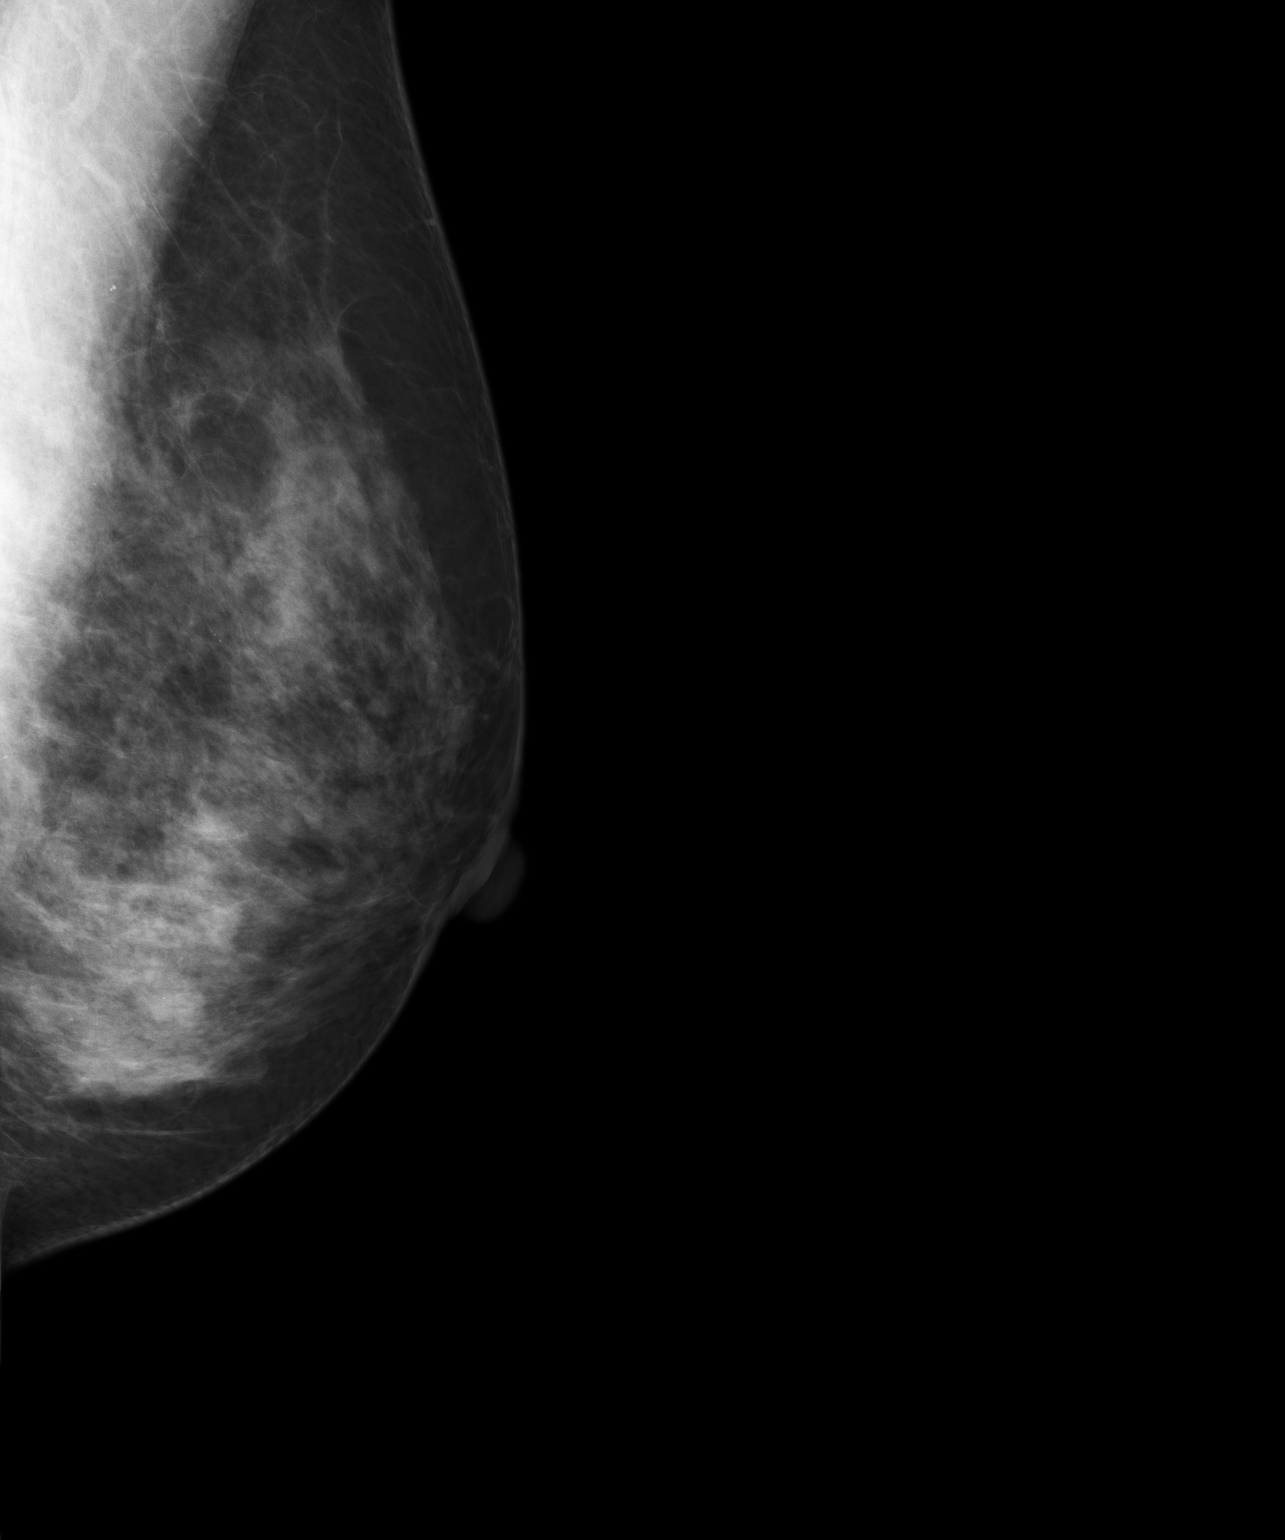
[im 2/2]
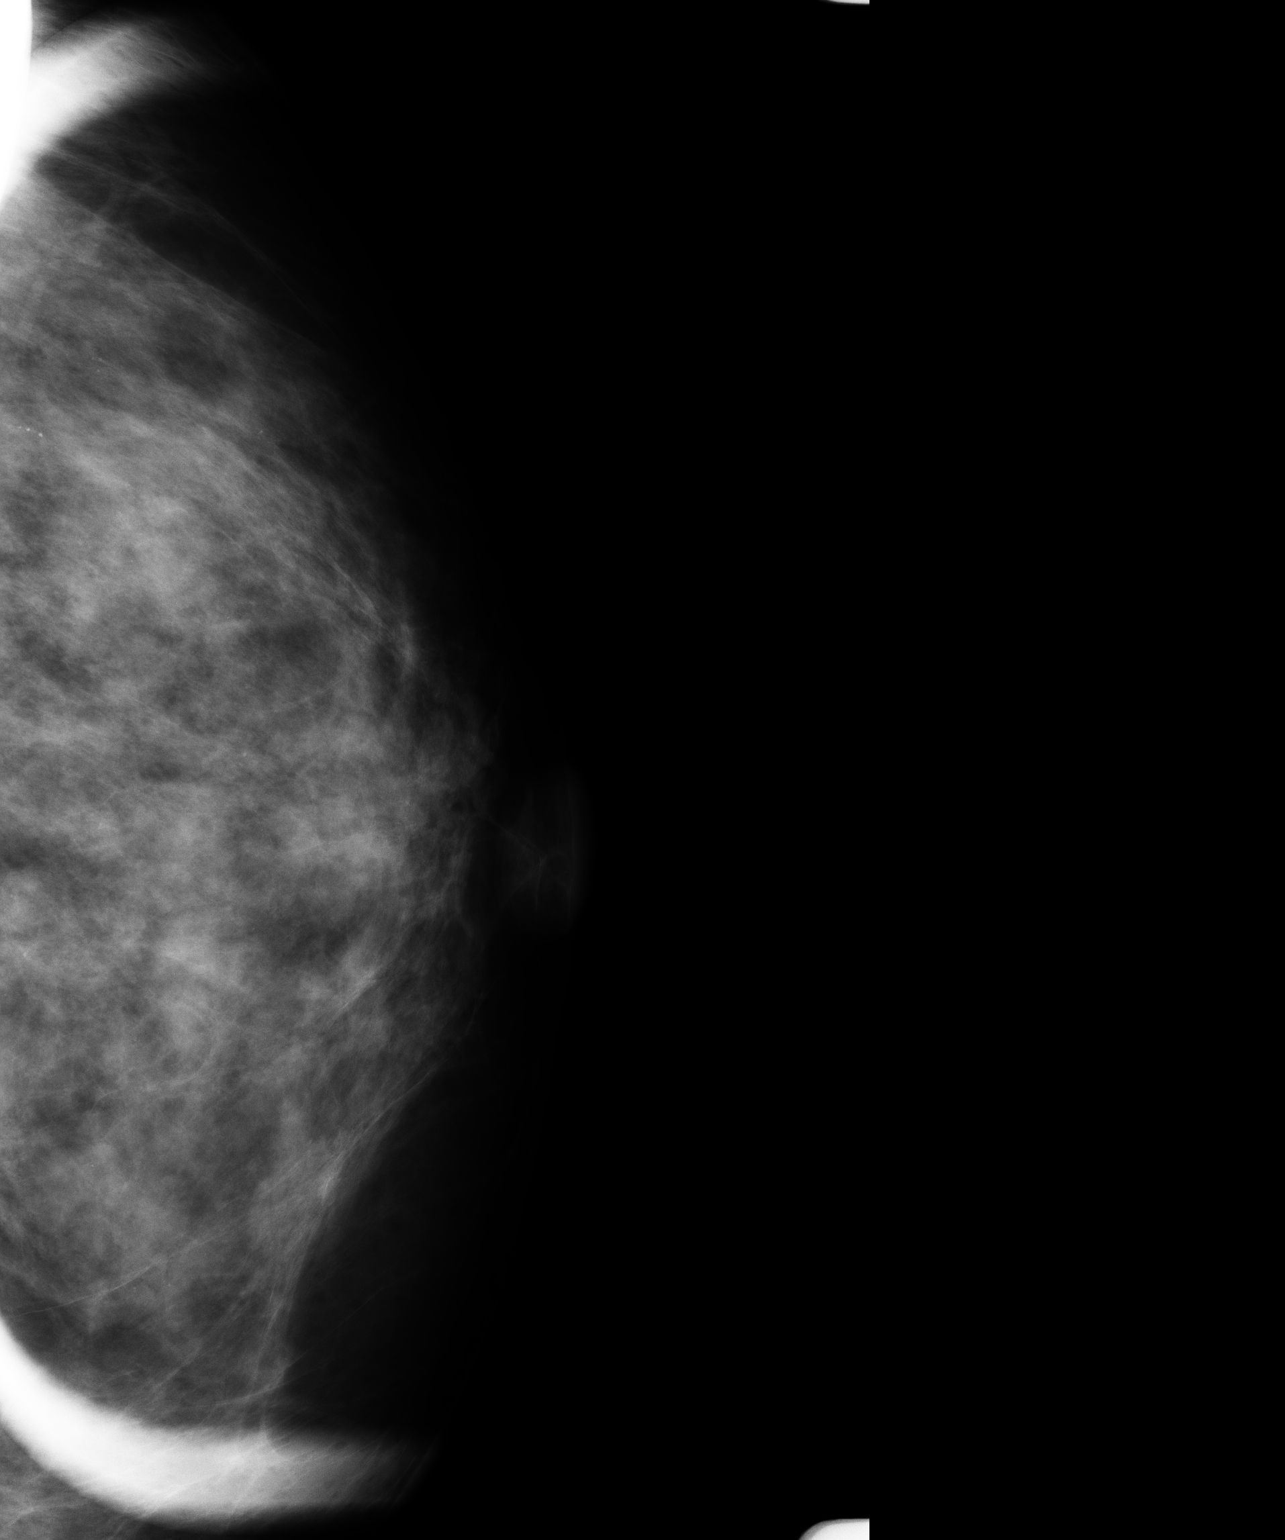

[2 of 2 positions shown; findings below may reference images not displayed]

IMPRESSION: 1.Negative exam. No evidence of retroareolar nodule on additional view
compression spot films.  To ensure stability a follow-up left breast
mammogram in six months is suggested.

BI-RADS: Category 3 - Probably Benign Finding (interval follow-up)

A NEGATIVE MAMMOGRAM REPORT DOES NOT PRECLUDE BIOPSY OR OTHER EVALUATION OF
A CLINICALLY PALPABLE OR OTHERWISE SUSPICIOUS MASS OR LESION. BREAST CANCER
MAY NOT BE DETECTED BY MAMMOGRAPHY IN UP TO 10% OF CASES.

## 2009-07-08 ENCOUNTER — Ambulatory Visit: Payer: Self-pay | Admitting: Family Medicine

## 2009-07-08 IMAGING — MG MM MAMMO DIAGNOSTIC UNILATERAL*L*
1 series · 3 of 3 positions shown · non-contrast
Comparison: none

REASON FOR EXAM: 6 month F U
COMMENTS:

PROCEDURE:     MAM - MAM DGTL UNI MAM LT BREAST W/CAD  - [DATE]  [DATE]
RESULT:     No dominant masses or pathologic clustered calcifications
demonstrated. CAD evaluation is nonfocal.

[L CC · left · 3 of 3 slices shown]
[im 1/3]
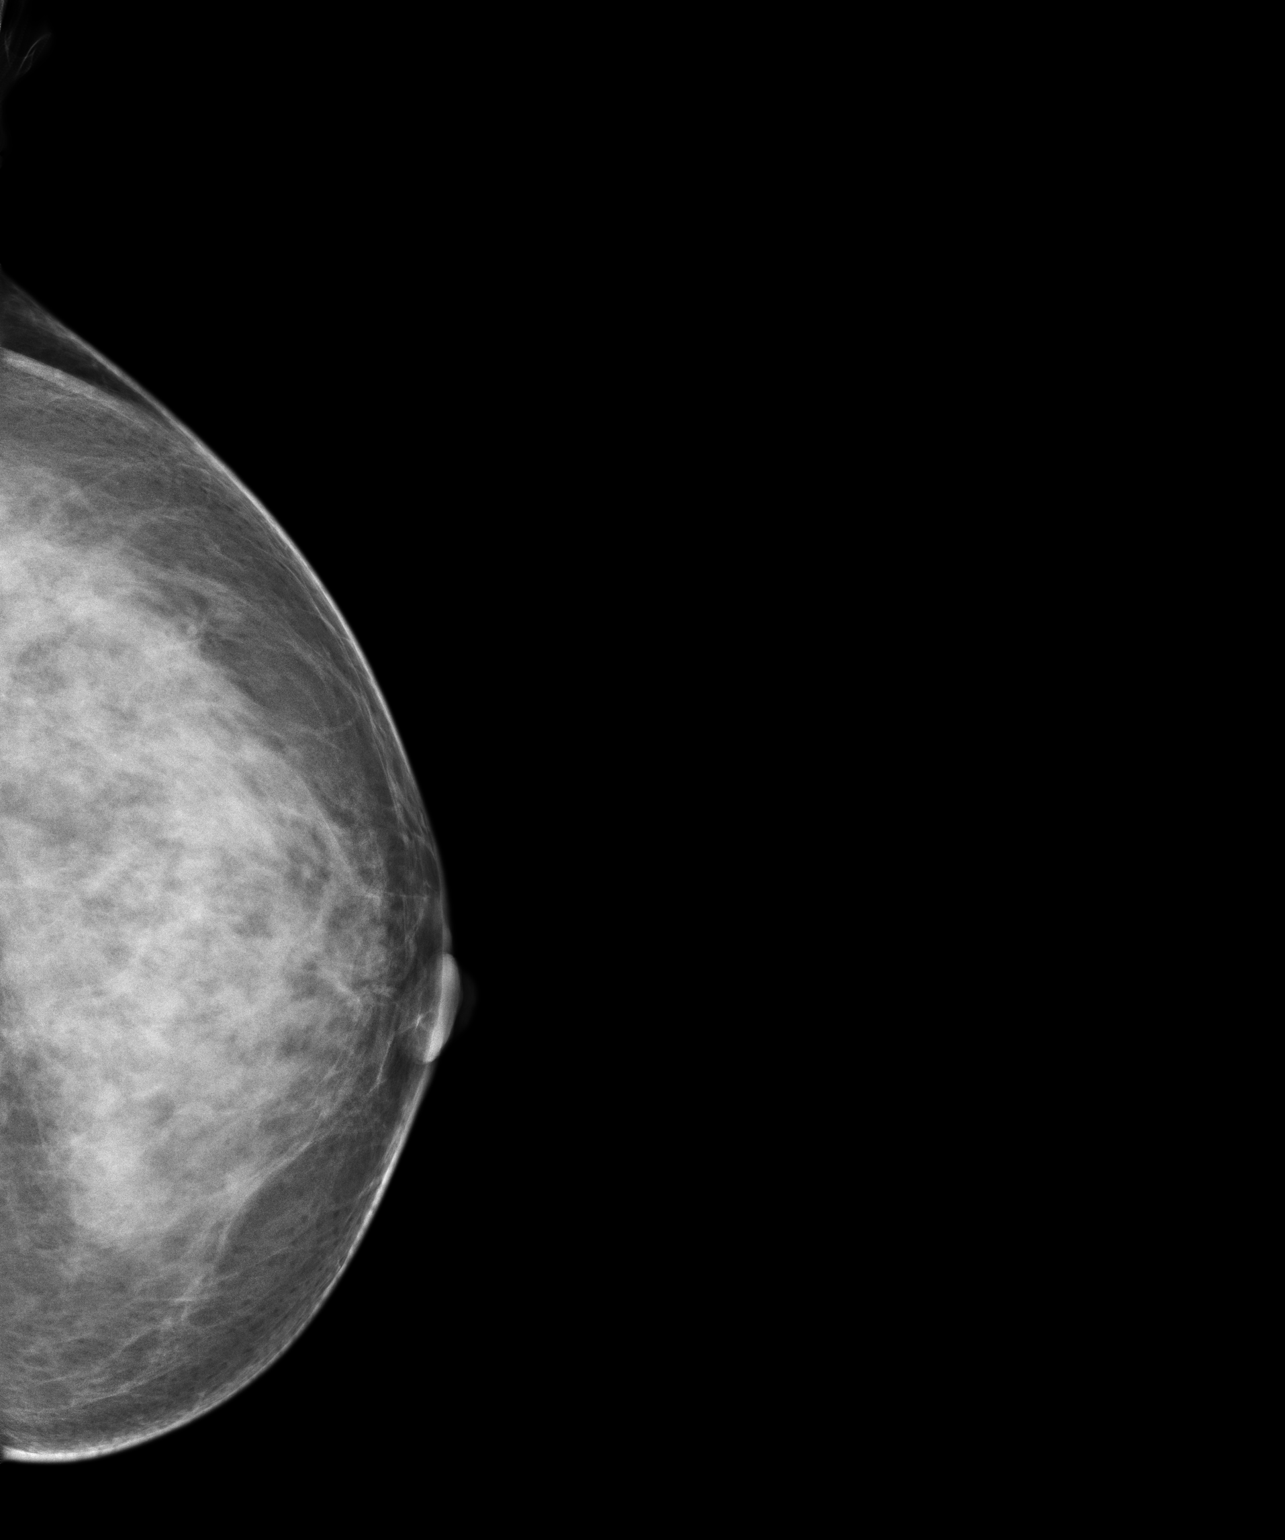
[im 2/3]
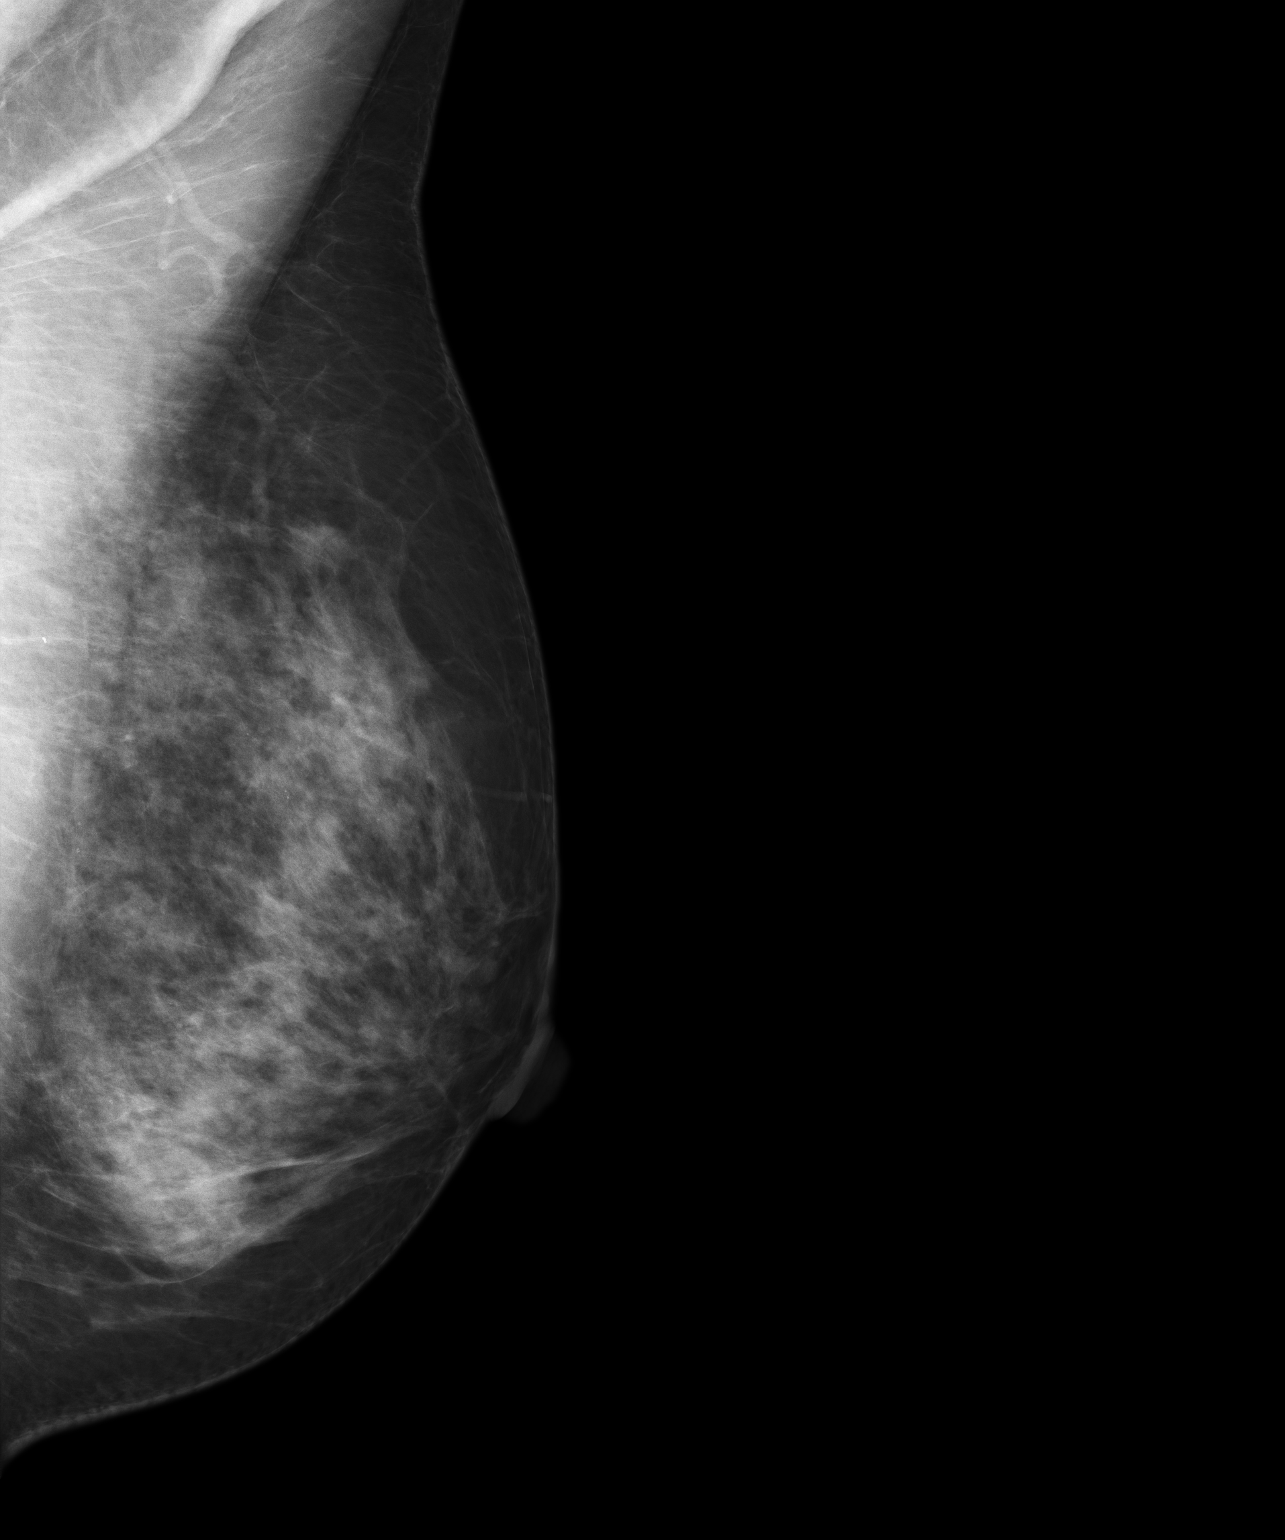
[im 3/3]
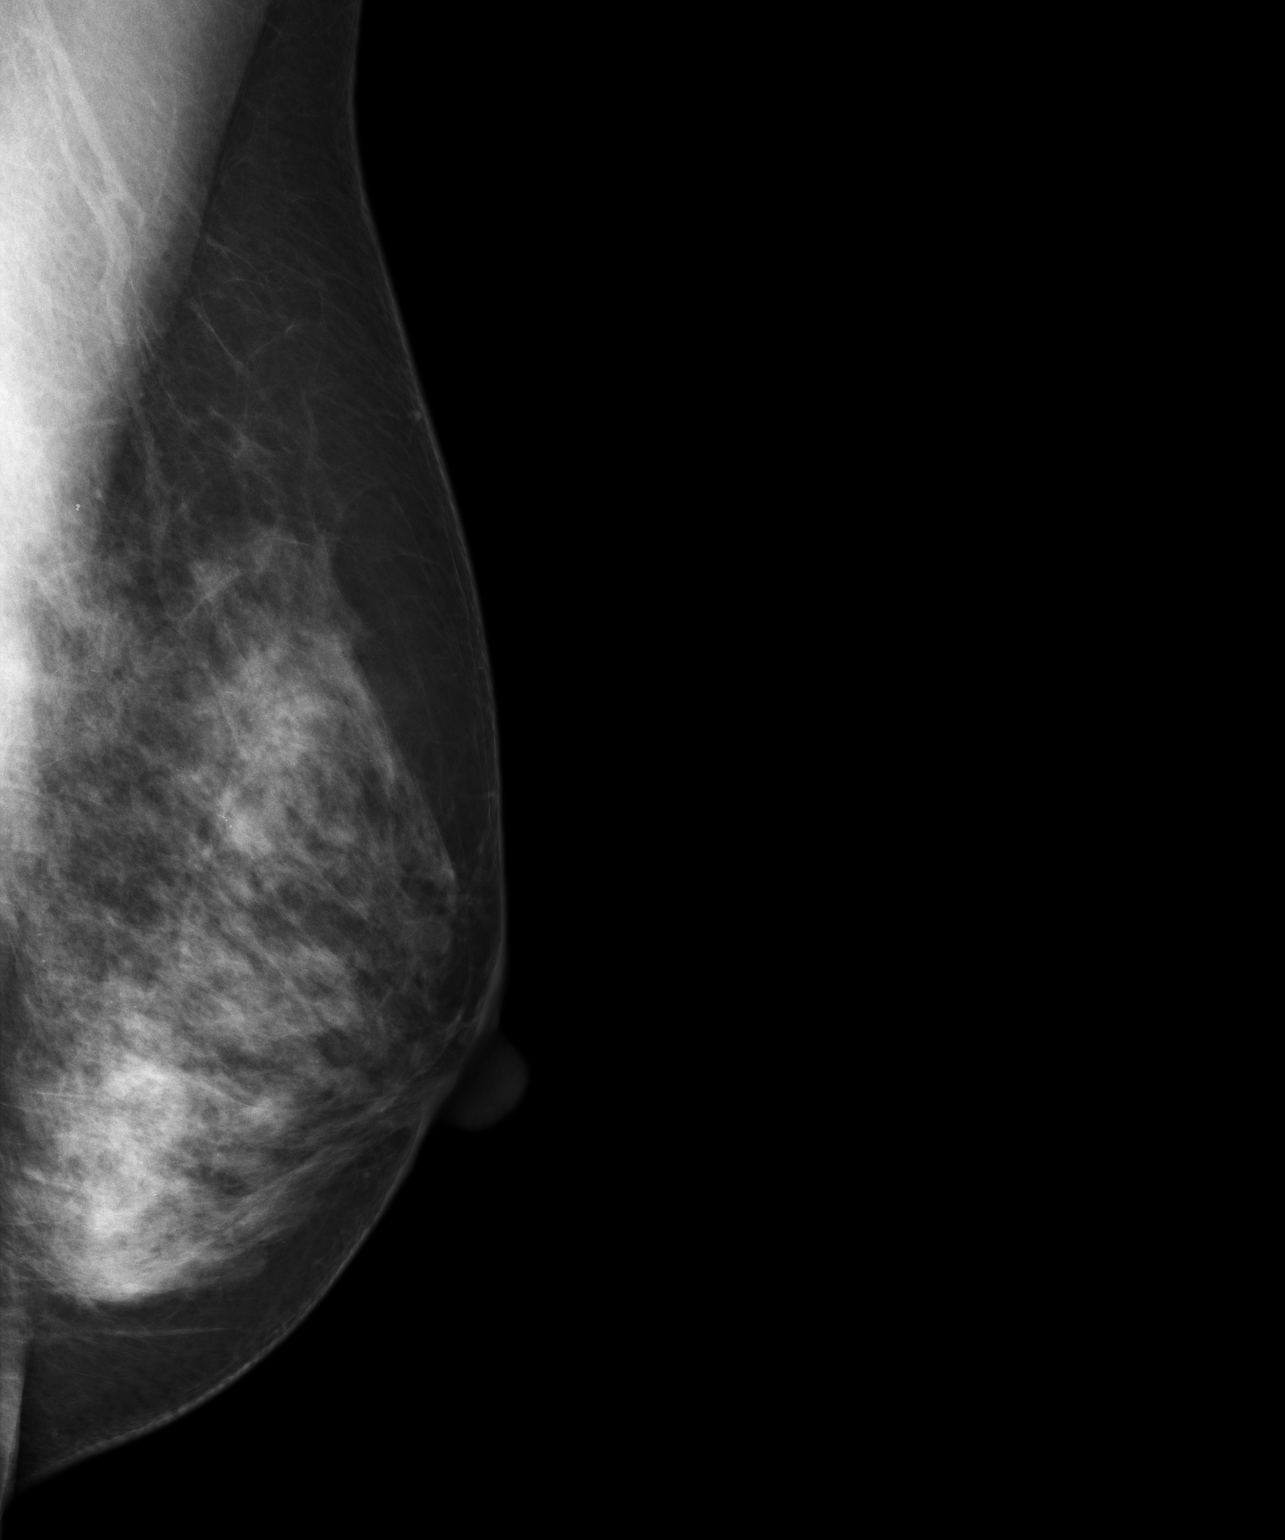

[3 of 3 positions shown; findings below may reference images not displayed]

IMPRESSION: Benign exam. Routine yearly follow-up mammograms suggested.
The patient's bilateral mammogram is due in [DATE].

BI-RADS: Category 3 - Probably Benign Finding (interval follow-up)

Thank you for this opportunity to contribute to the care of your patient.

A NEGATIVE MAMMOGRAM REPORT DOES NOT PRECLUDE BIOPSY OR OTHER EVALUATION OF
A CLINICALLY PALPABLE OR OTHERWISE SUSPICIOUS MASS OR LESION. BREAST CANCER
MAY NOT BE DETECTED BY MAMMOGRAPHY IN UP TO 10% OF CASES.

## 2010-03-18 ENCOUNTER — Ambulatory Visit: Payer: Self-pay | Admitting: Family Medicine

## 2010-03-18 IMAGING — MG MM CAD SCREENING MAMMO
1 series · 4 of 4 positions shown · non-contrast
Comparison: none

REASON FOR EXAM: scr
COMMENTS:

PROCEDURE:     MAM - MAM DGTL SCREENING MAMMO W/CAD  - [DATE]  [DATE]
RESULT:     No dominant masses or pathologic clustered calcifications are
demonstrated.  Nodular parenchymal pattern is present with benign
calcifications.

[Series 3680: R CC · right · 4 of 4 slices shown]
[im 1/4]
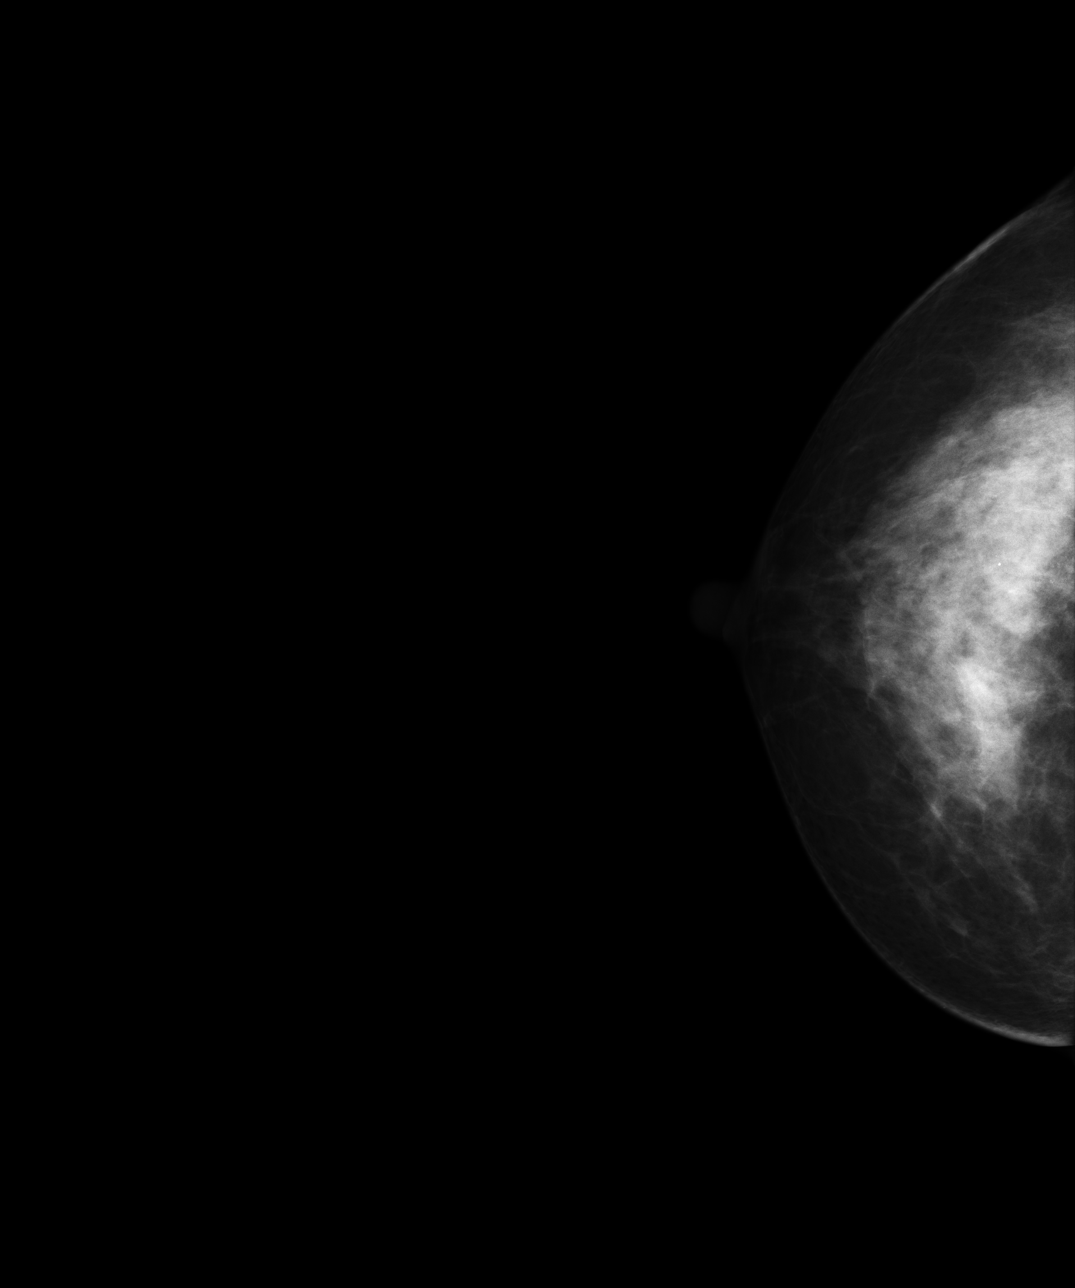
[im 2/4]
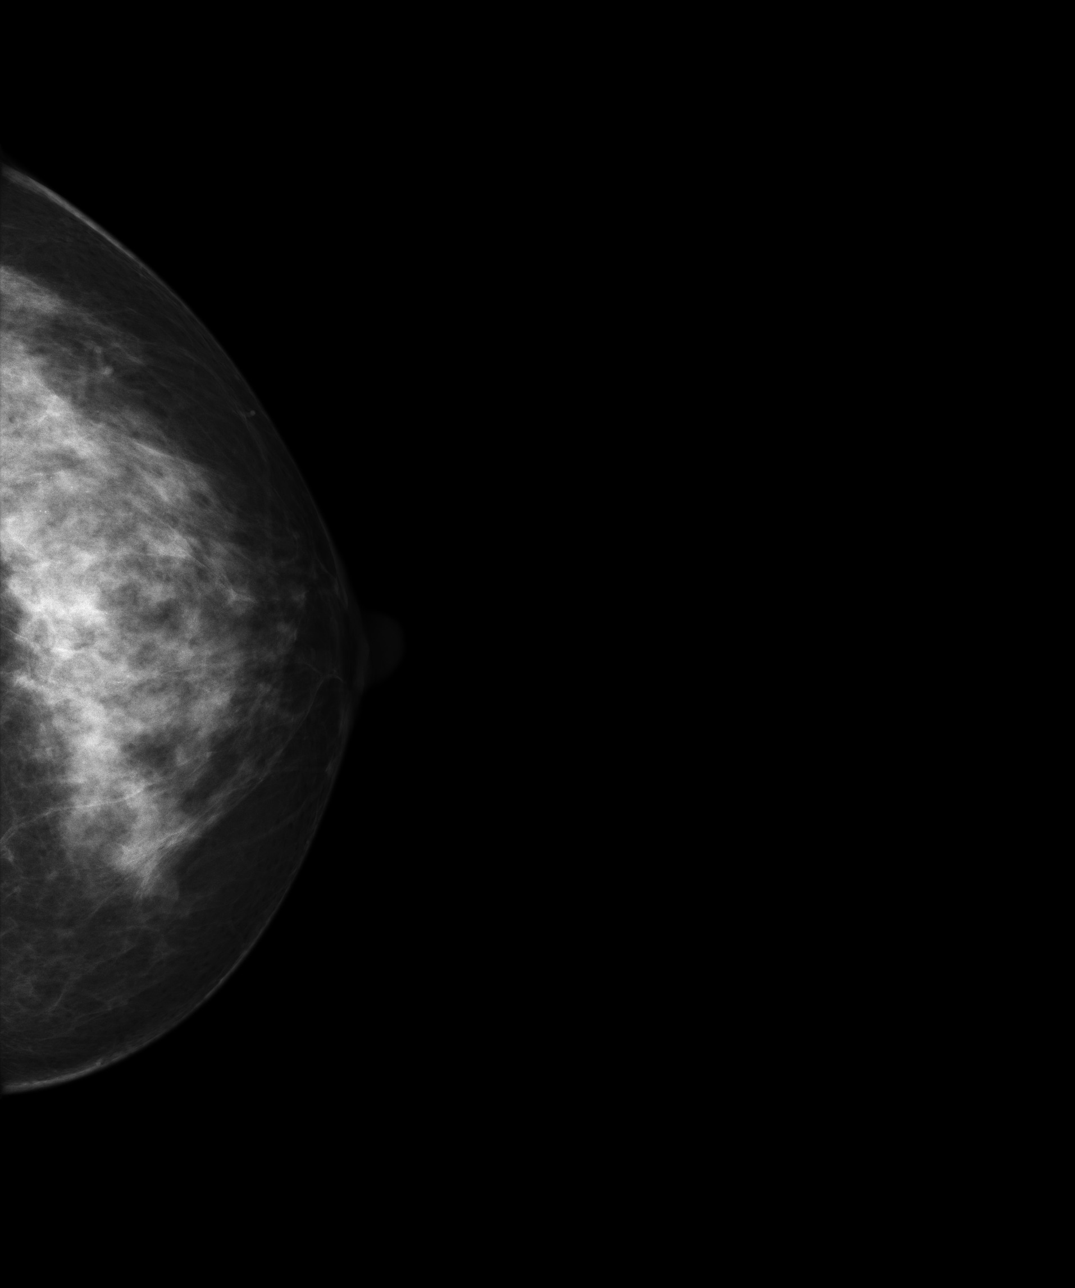
[im 3/4]
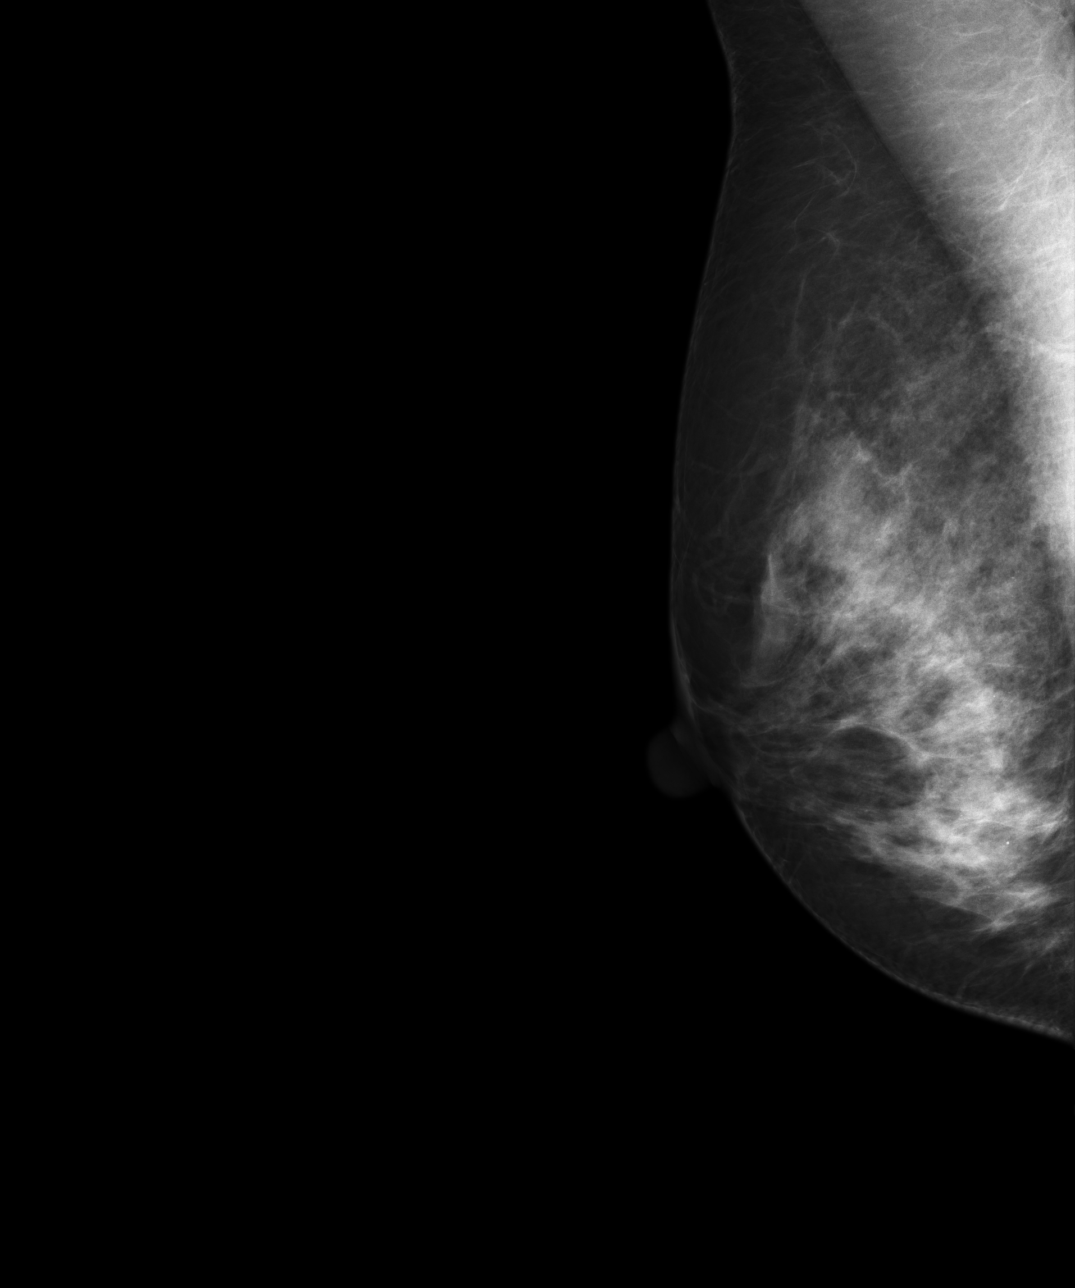
[im 4/4]
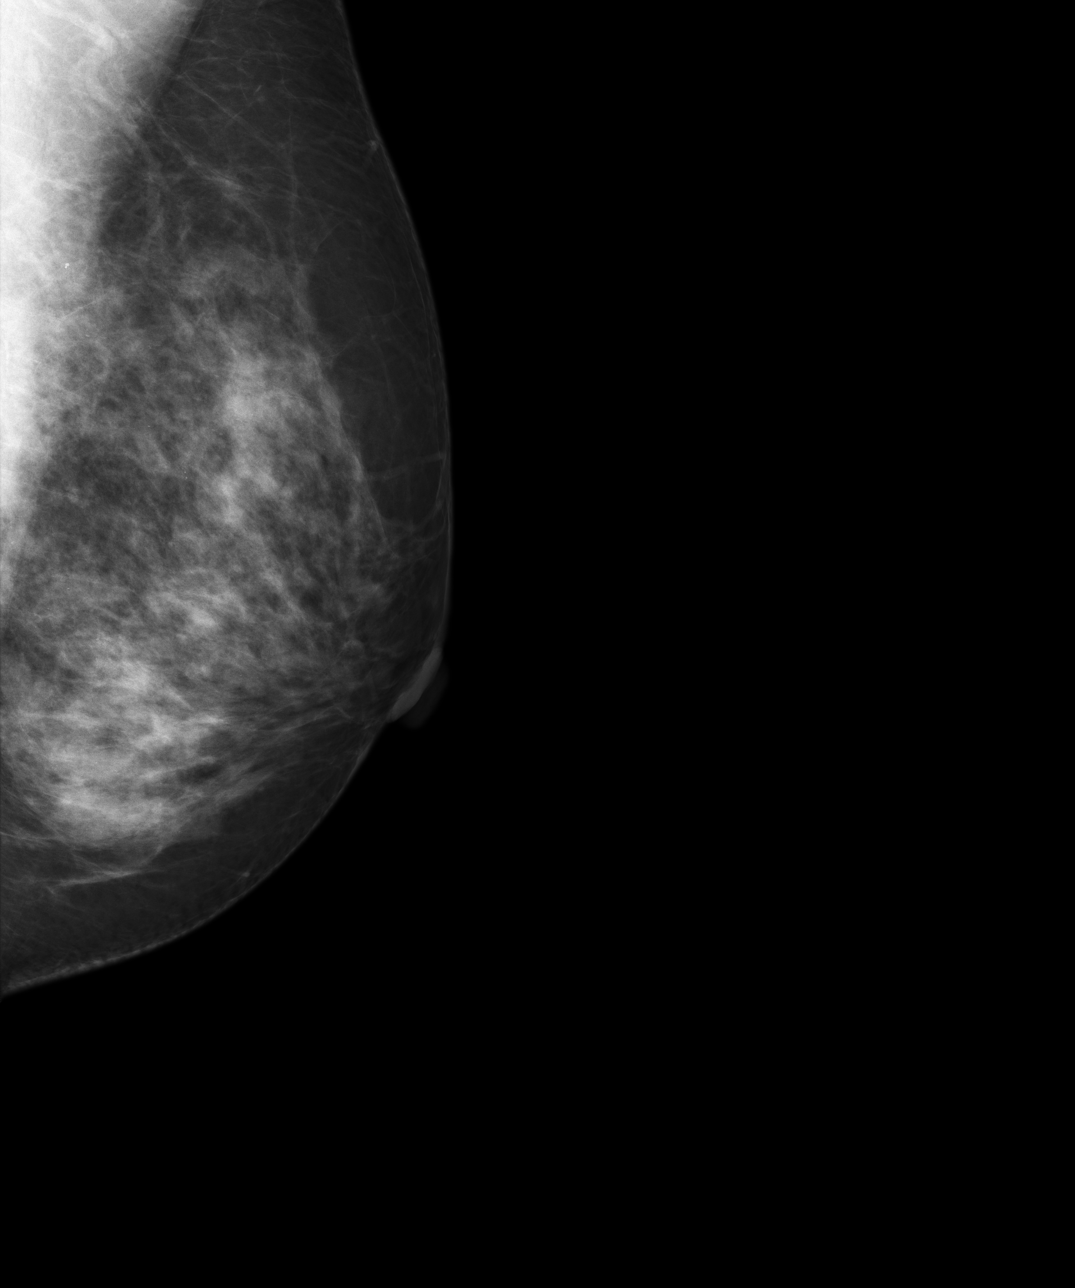

[4 of 4 positions shown; findings below may reference images not displayed]

IMPRESSION: 1.Stable, benign exam with stable nodularity and stable scattered
microcalcifications. Yearly follow-up exam is suggested.

BI-RADS: Category 2 - Benign Findings

A NEGATIVE MAMMOGRAM REPORT DOES NOT PRECLUDE BIOPSY OR OTHER EVALUATION OF
A CLINICALLY PALPABLE OR OTHERWISE SUSPICIOUS MASS OR LESION. BREAST CANCER
MAY NOT BE DETECTED BY MAMMOGRAPHY IN UP TO 10% OF CASES.

## 2011-04-14 ENCOUNTER — Ambulatory Visit: Payer: Self-pay | Admitting: Family Medicine

## 2011-04-14 IMAGING — MG MM CAD SCREENING MAMMO
1 series · 4 of 4 positions shown · non-contrast
Comparison: none

REASON FOR EXAM: SCR MAMMO
COMMENTS:

PROCEDURE:     MAM - MAM DGTL SCREENING MAMMO W/CAD  - [DATE]  [DATE]
RESULT:     Comparison is made to prior examinations [DATE],[DATE] and [DATE]. The breast parenchyma bilaterally is
heterogeneously dense. No dominant mass or malignant appearing
microcalcifications are seen.

[Series 4641: R CC · right · 4 of 4 slices shown]
[im 1/4]
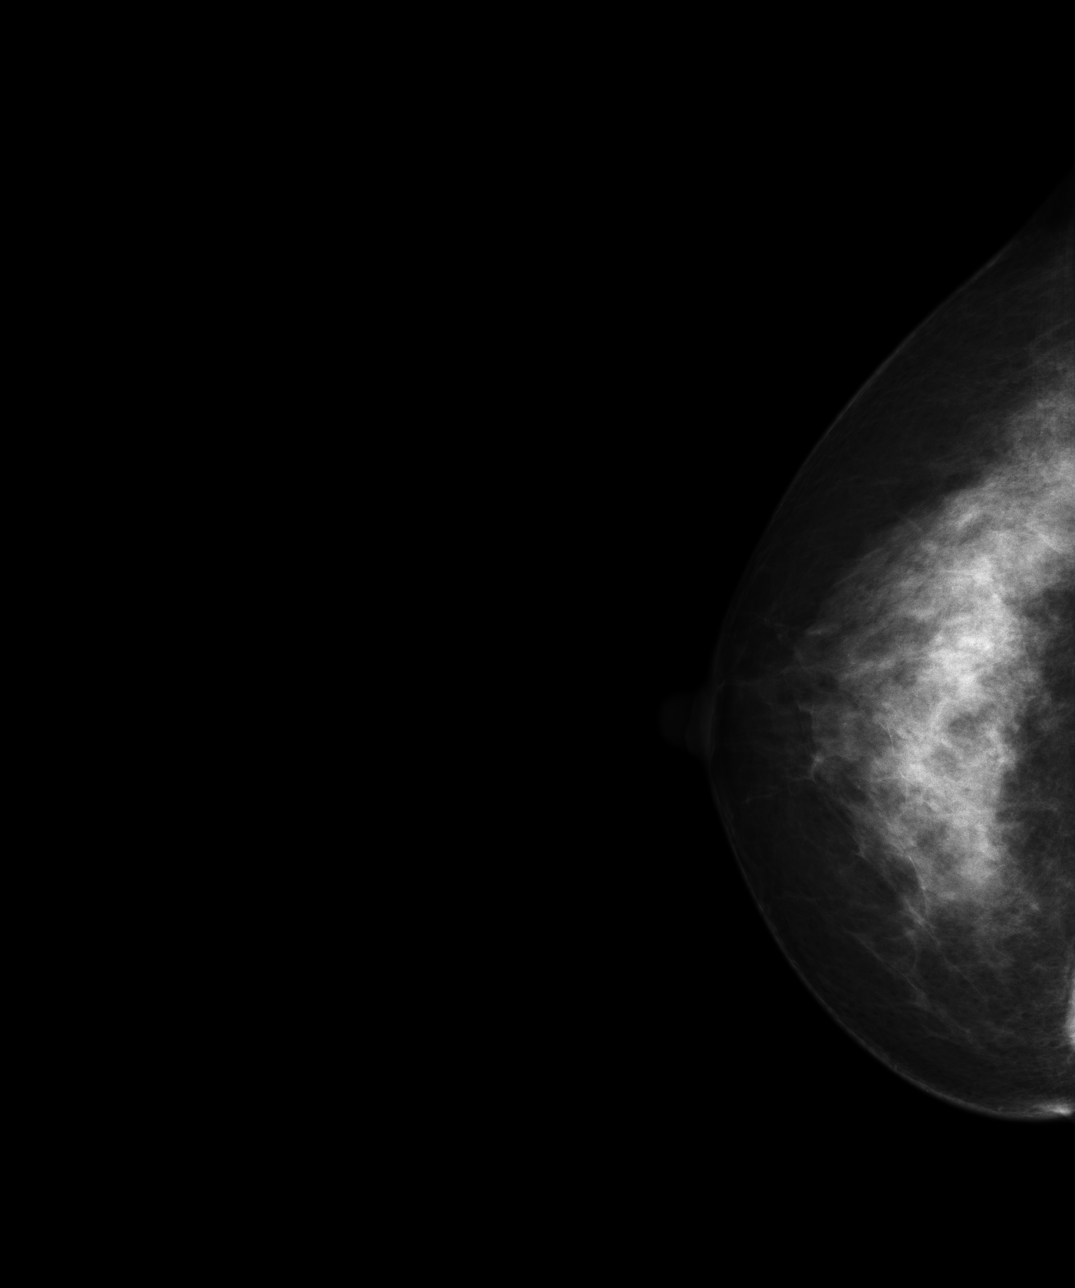
[im 2/4]
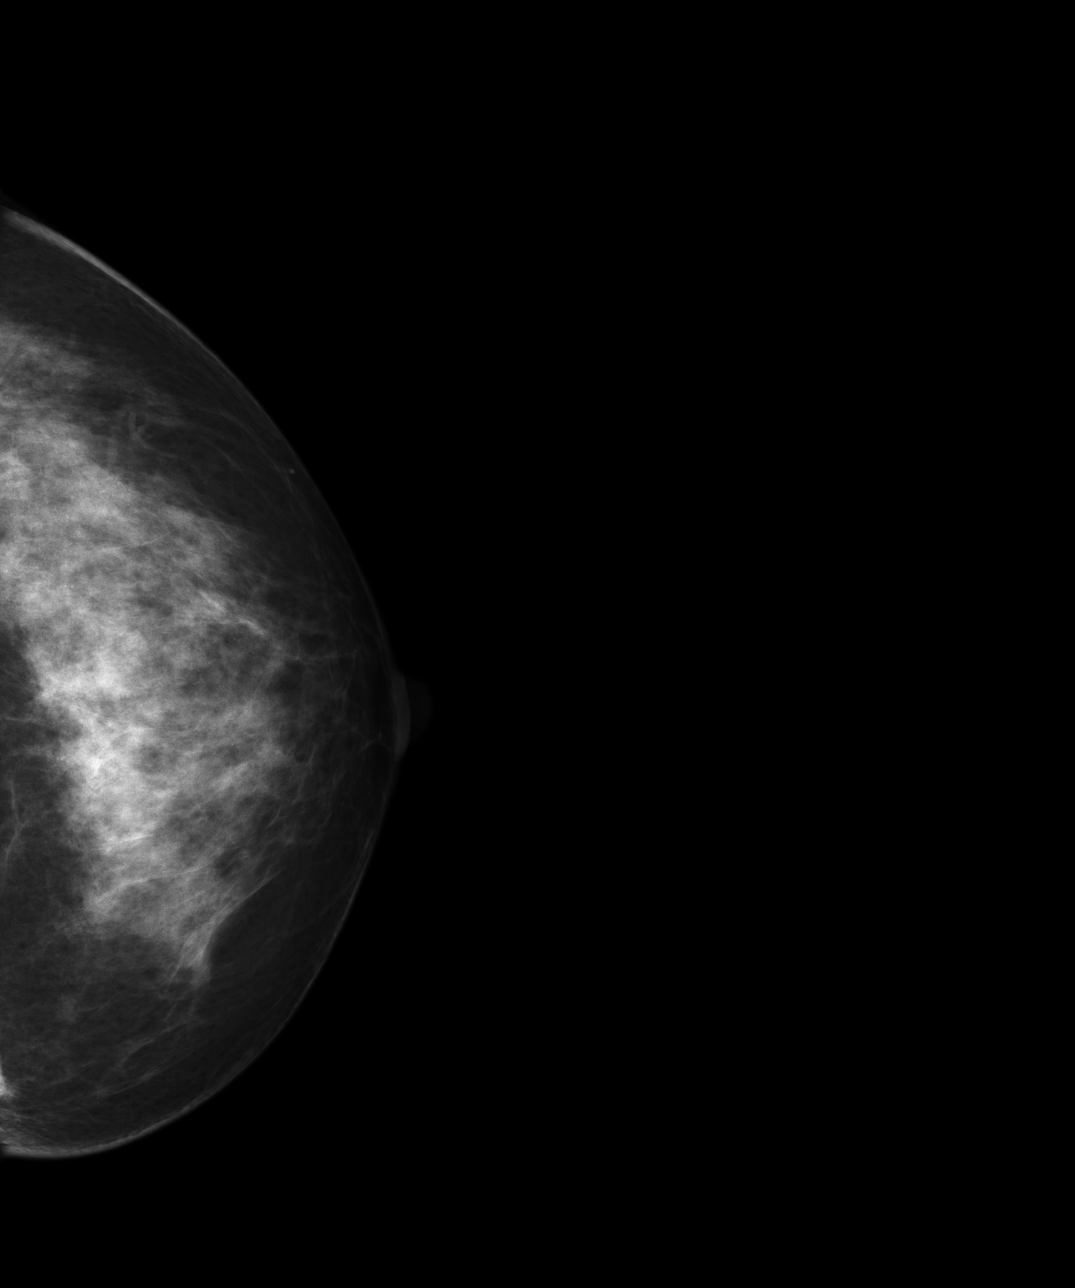
[im 3/4]
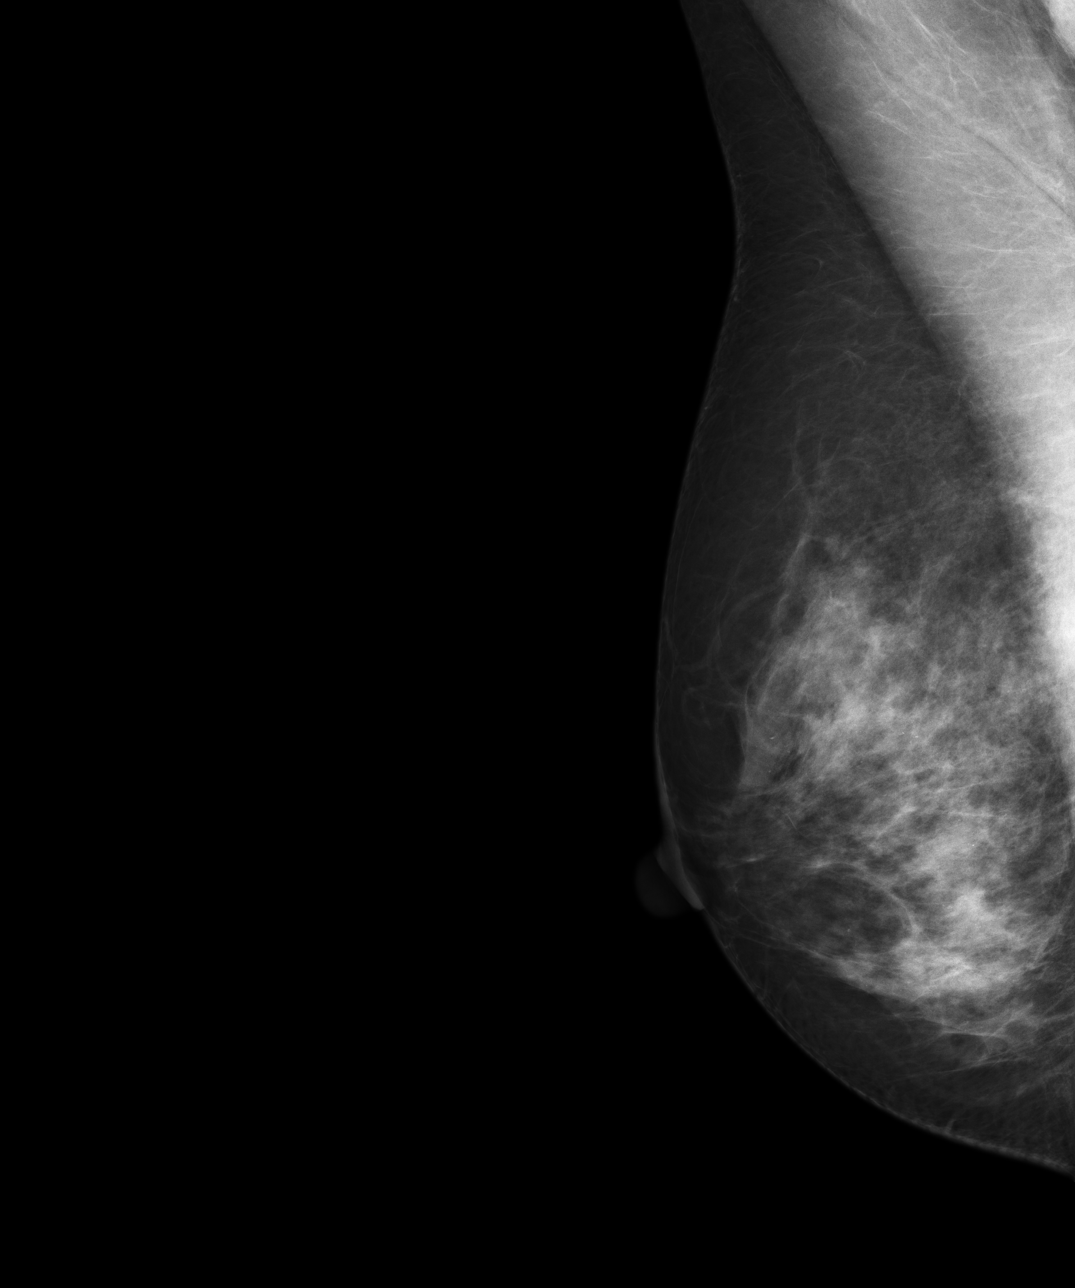
[im 4/4]
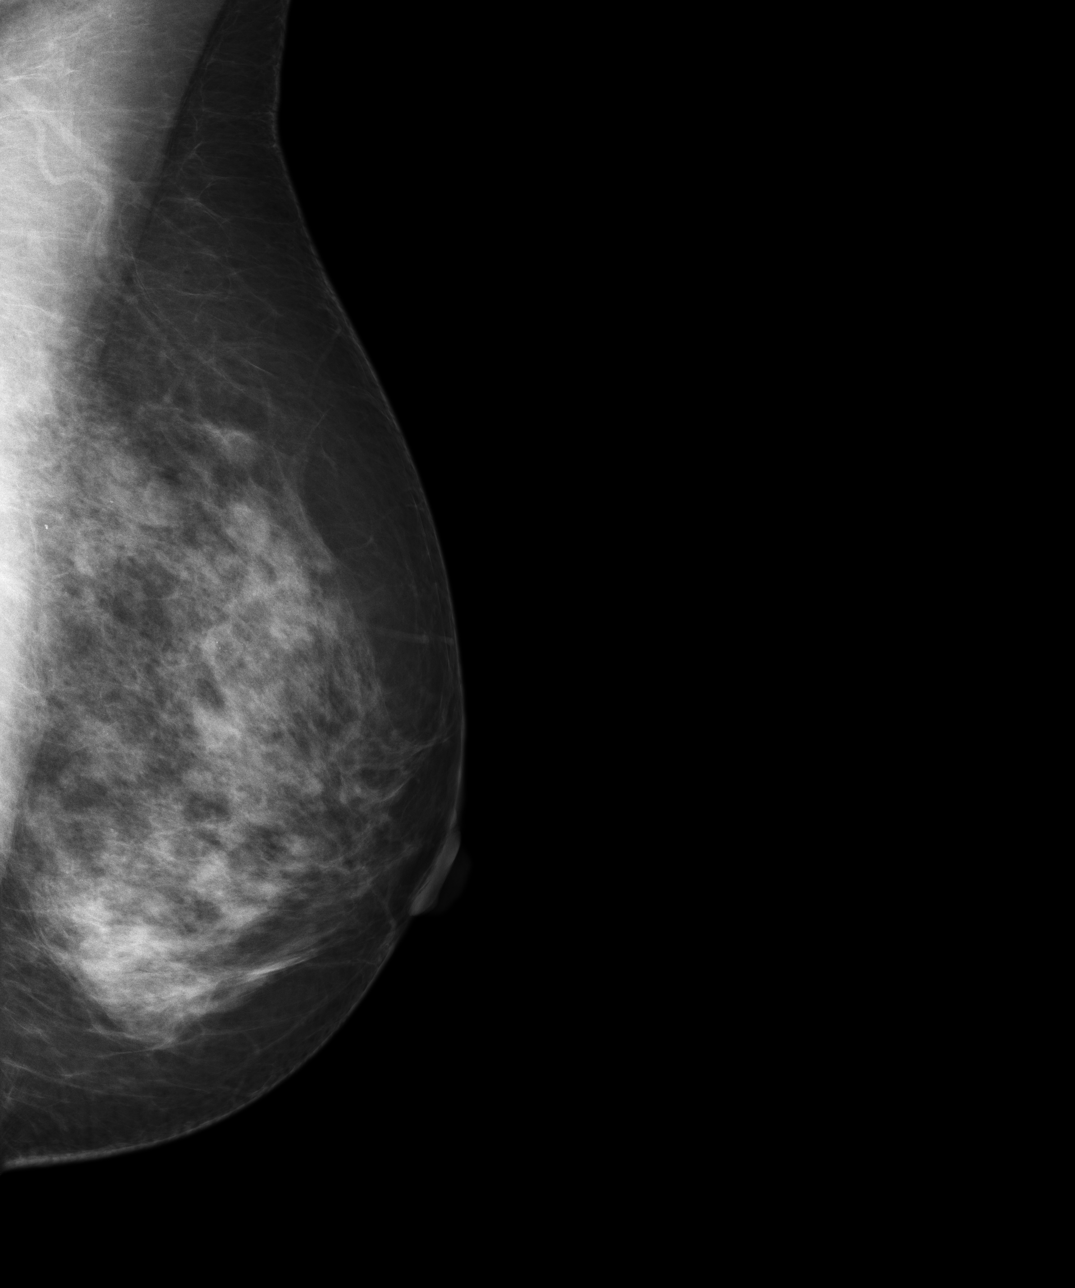

[4 of 4 positions shown; findings below may reference images not displayed]

IMPRESSION: 1. Bilaterally benign-appearing screening mammography.
2. Continued annual screening mammography is recommended.

BI-RADS: Category 1 - Negative

A NEGATIVE MAMMOGRAM REPORT DOES NOT PRECLUDE BIOPSY OR OTHER EVALUATION OF
A CLINICALLY PALPABLE OR OTHERWISE SUSPICIOUS MASS OR LESION. BREAST CANCER
MAY NOT BE DETECTED BY MAMMOGRAPHY IN UP TO 10% OF CASES.

## 2012-04-17 ENCOUNTER — Ambulatory Visit: Payer: Self-pay | Admitting: Family Medicine

## 2012-04-17 IMAGING — MG MM CAD SCREENING MAMMO
1 series · 5 of 5 positions shown · non-contrast
Comparison: none

REASON FOR EXAM: SCR MAMMO NO ORDER
COMMENTS:

[R CC · right · 5 of 5 slices shown]
[im 1/5]
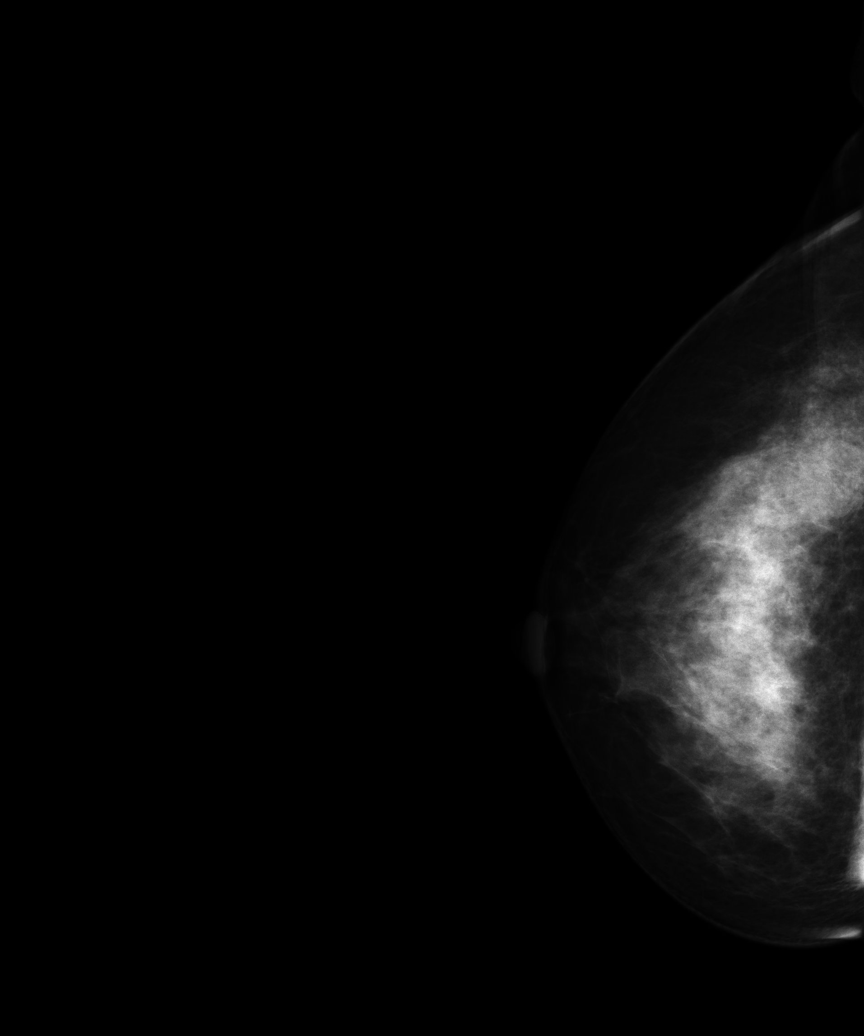
[im 2/5]
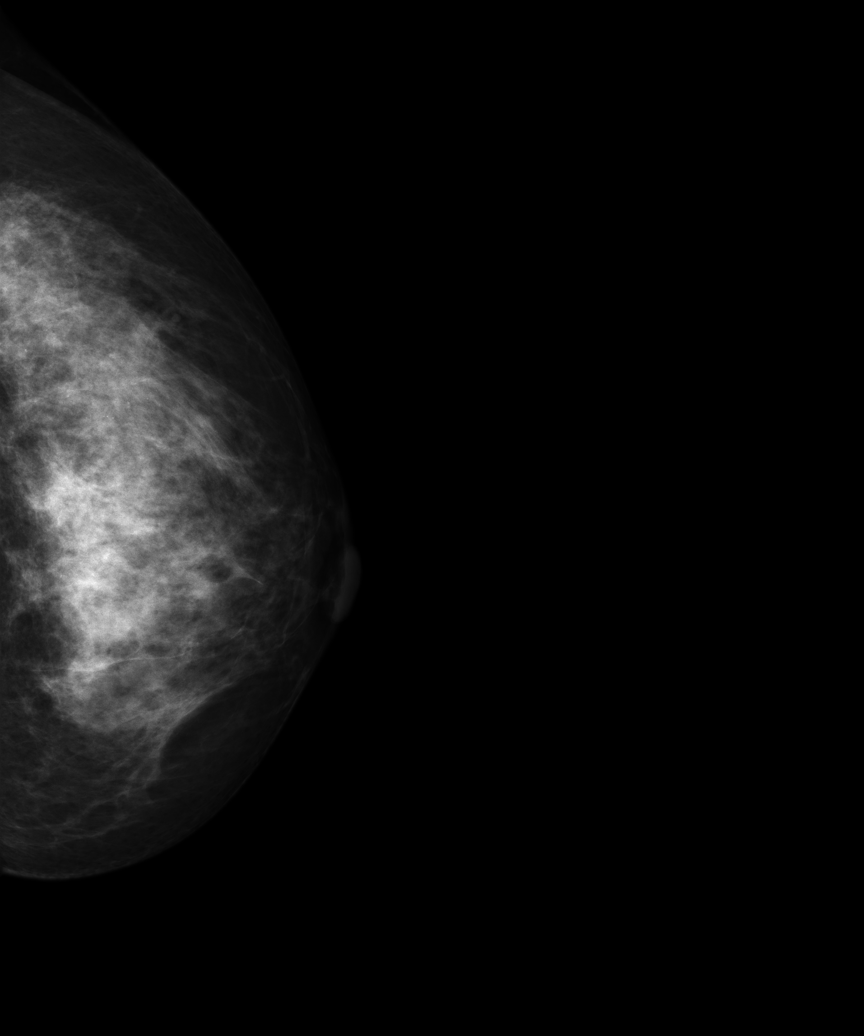
[im 3/5]
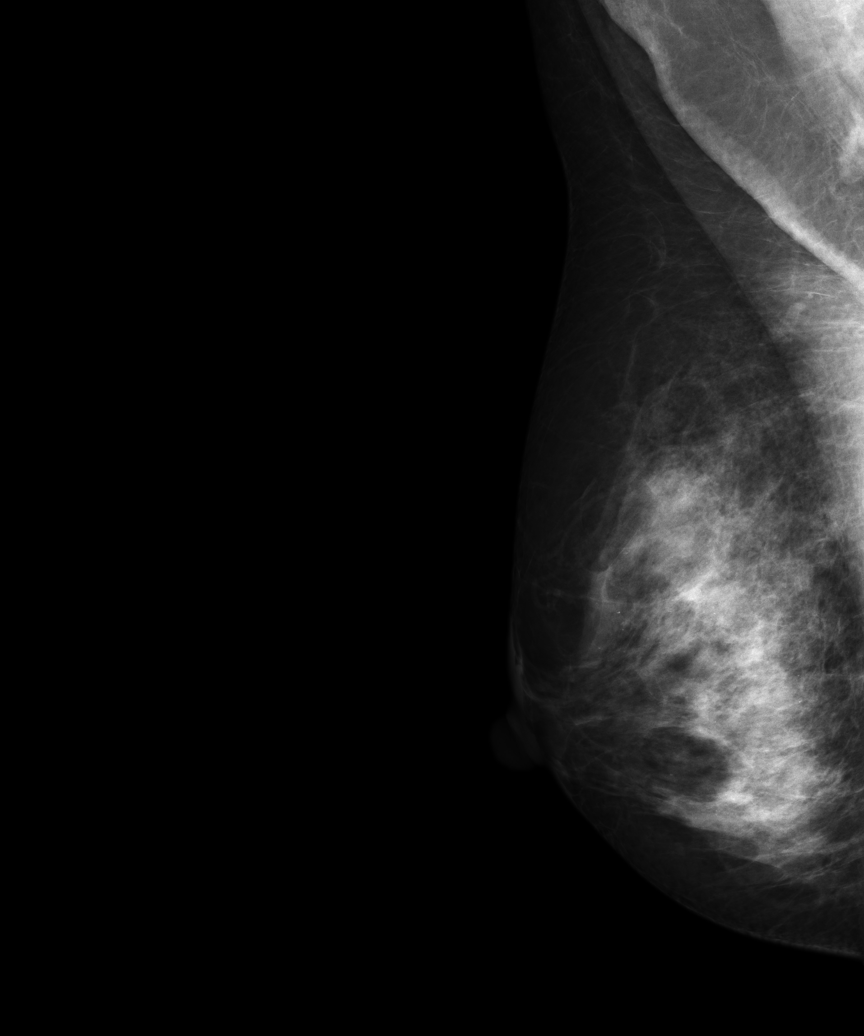
[im 4/5]
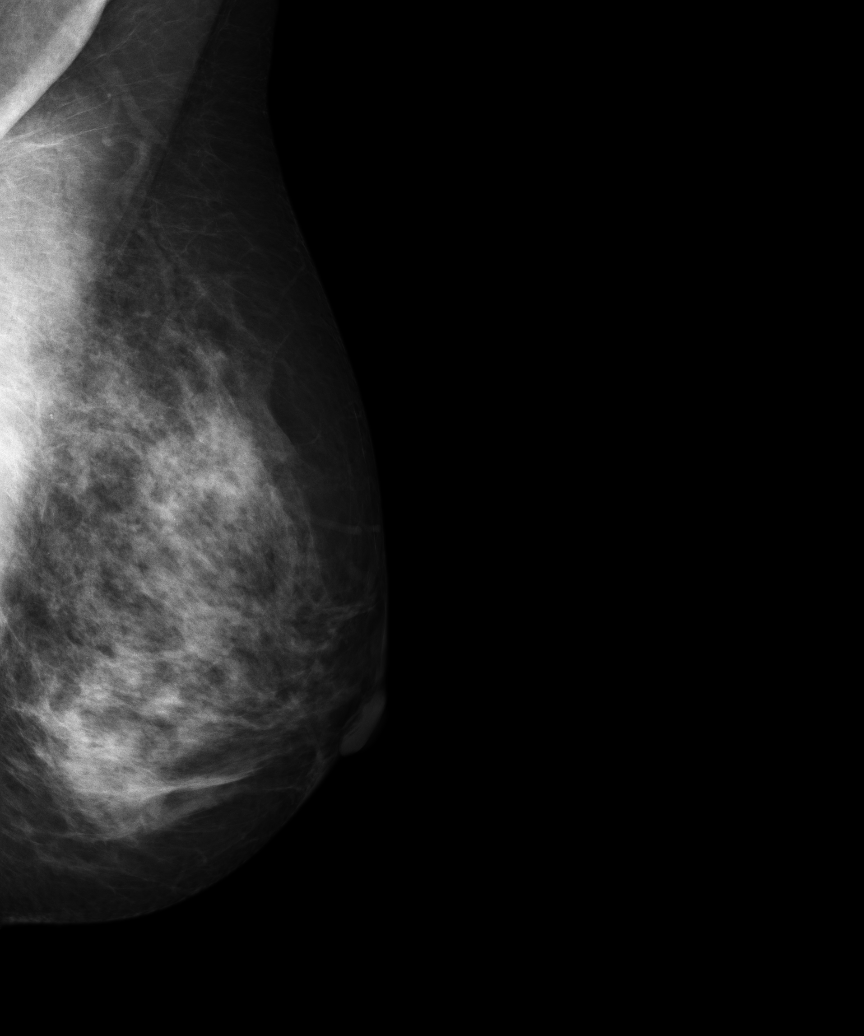
[im 5/5]
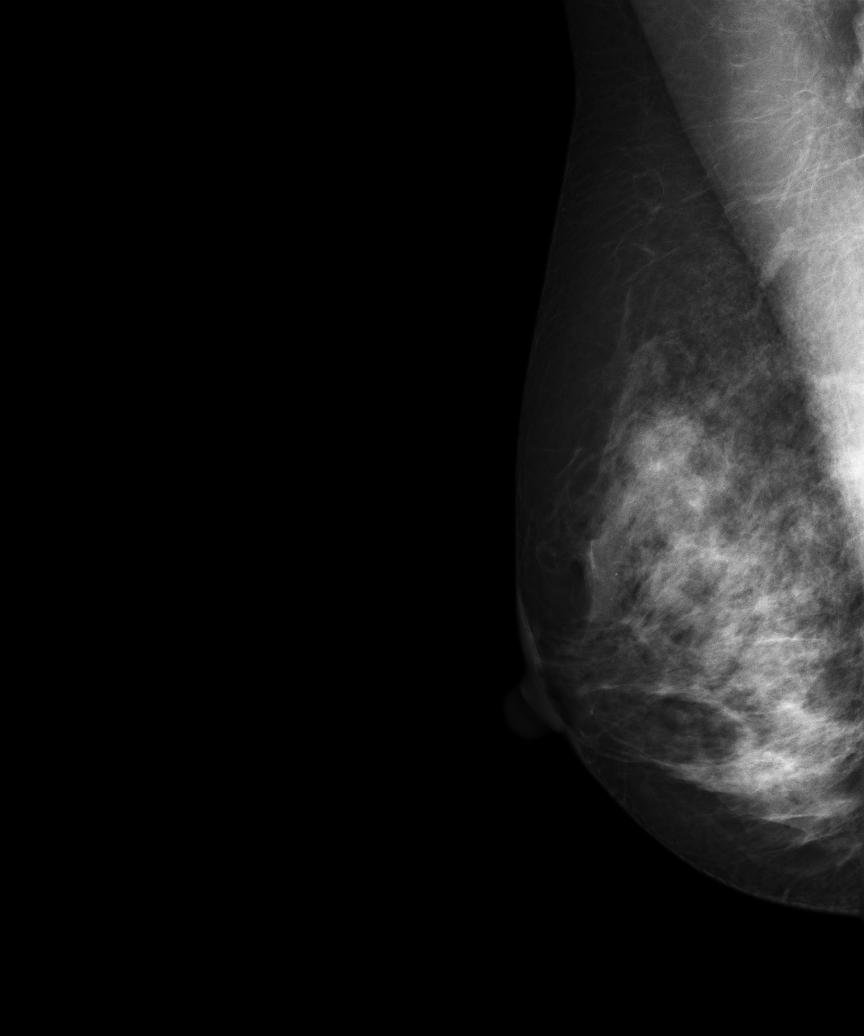

[5 of 5 positions shown; findings below may reference images not displayed]

PROCEDURE:     MAM - MAM DGTL SCRN MAM NO ORDER W/CAD  - [DATE]  [DATE]

RESULT:     Comparison is made to previous digital studies [DATE],[DATE], and [DATE].

The breasts exhibit a heterogeneously dense parenchymal pattern. There is no
dominant mass. There are no malignant appearing groupings of
microcalcification. No area of new architectural distortion is seen. There
are a few scattered coarse micro and macrocalcifications in both breasts.
IMPRESSION: There are no findings suspicious for malignancy.

BI-RADS 2: benign findings.

Recommendation: please continue to encourage yearly mammographic followup.

A NEGATIVE MAMMOGRAM REPORT DOES NOT PRECLUDE BIOPSY OR OTHER EVALUATION OF
A CLINICALLY PALPABLE OR OTHERWISE SUSPICIOUS MASS OR LESION. BREAST CANCER
MAY NOT BE DETECTED BY MAMMOGRAPHY IN UP TO 10% OF CASES.

[REDACTED]

## 2013-04-18 ENCOUNTER — Ambulatory Visit: Payer: Self-pay | Admitting: Family Medicine

## 2013-04-18 IMAGING — MG MM DIGITAL SCREENING BILAT W/ CAD
1 series · 4 of 4 positions shown · non-contrast
Comparison: Previous exam(s).

REASON FOR EXAM: SCR MAMMO NO ORDER
COMMENTS:

PROCEDURE:     MAM - MAM DGTL SCRN MAM NO ORDER W/CAD  - [DATE]  [DATE]
CLINICAL DATA: Screening.
EXAM:
DIGITAL SCREENING BILATERAL MAMMOGRAM WITH CAD

[R CC · right · 4 of 4 slices shown]
[im 1/4]
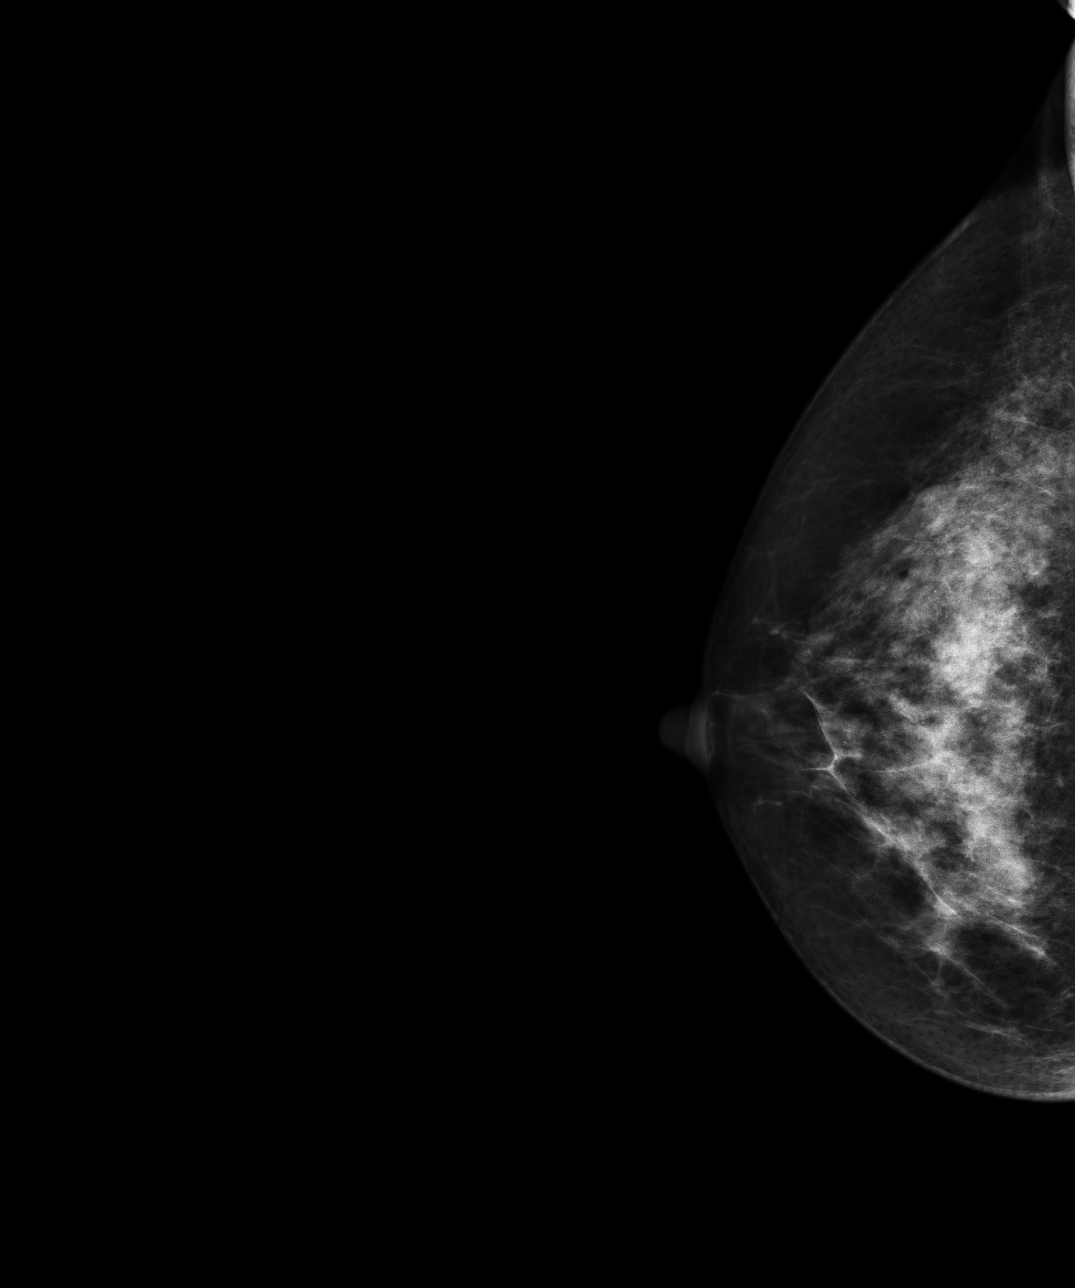
[im 2/4]
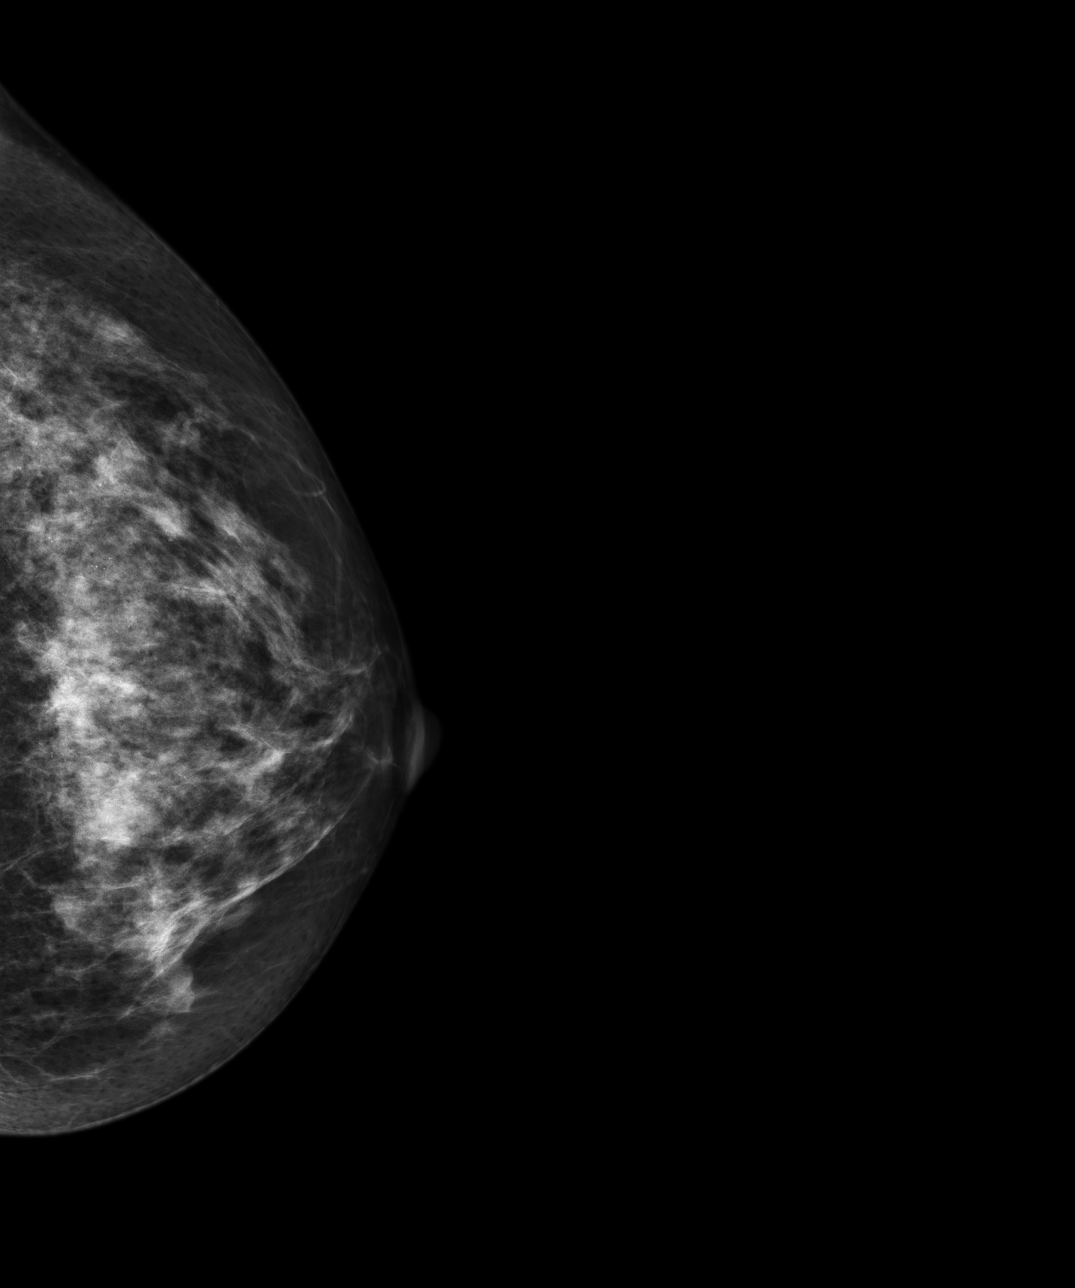
[im 3/4]
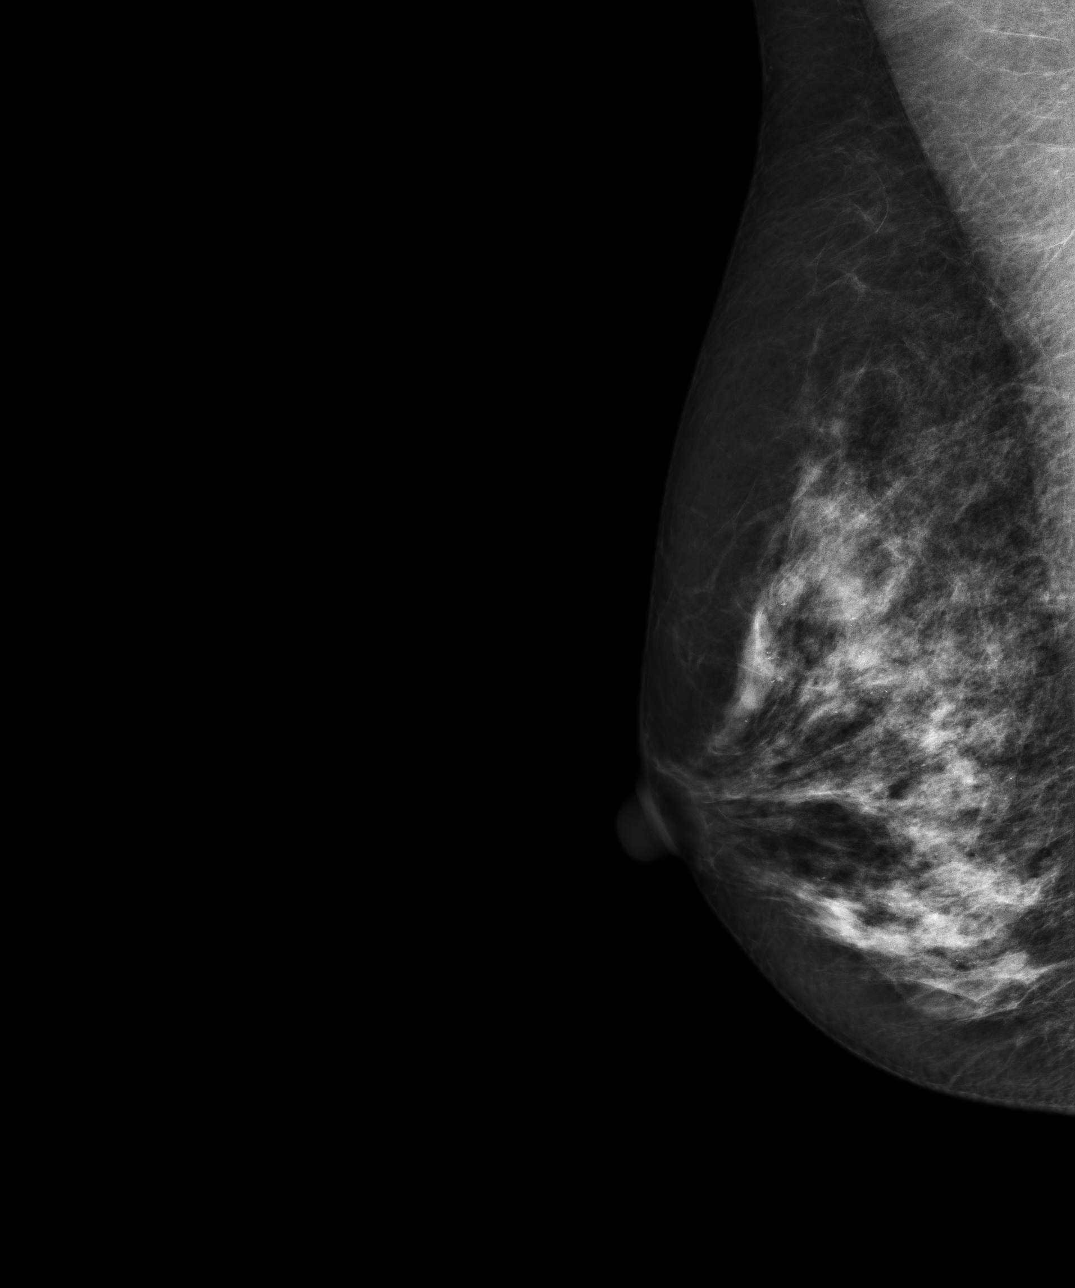
[im 4/4]
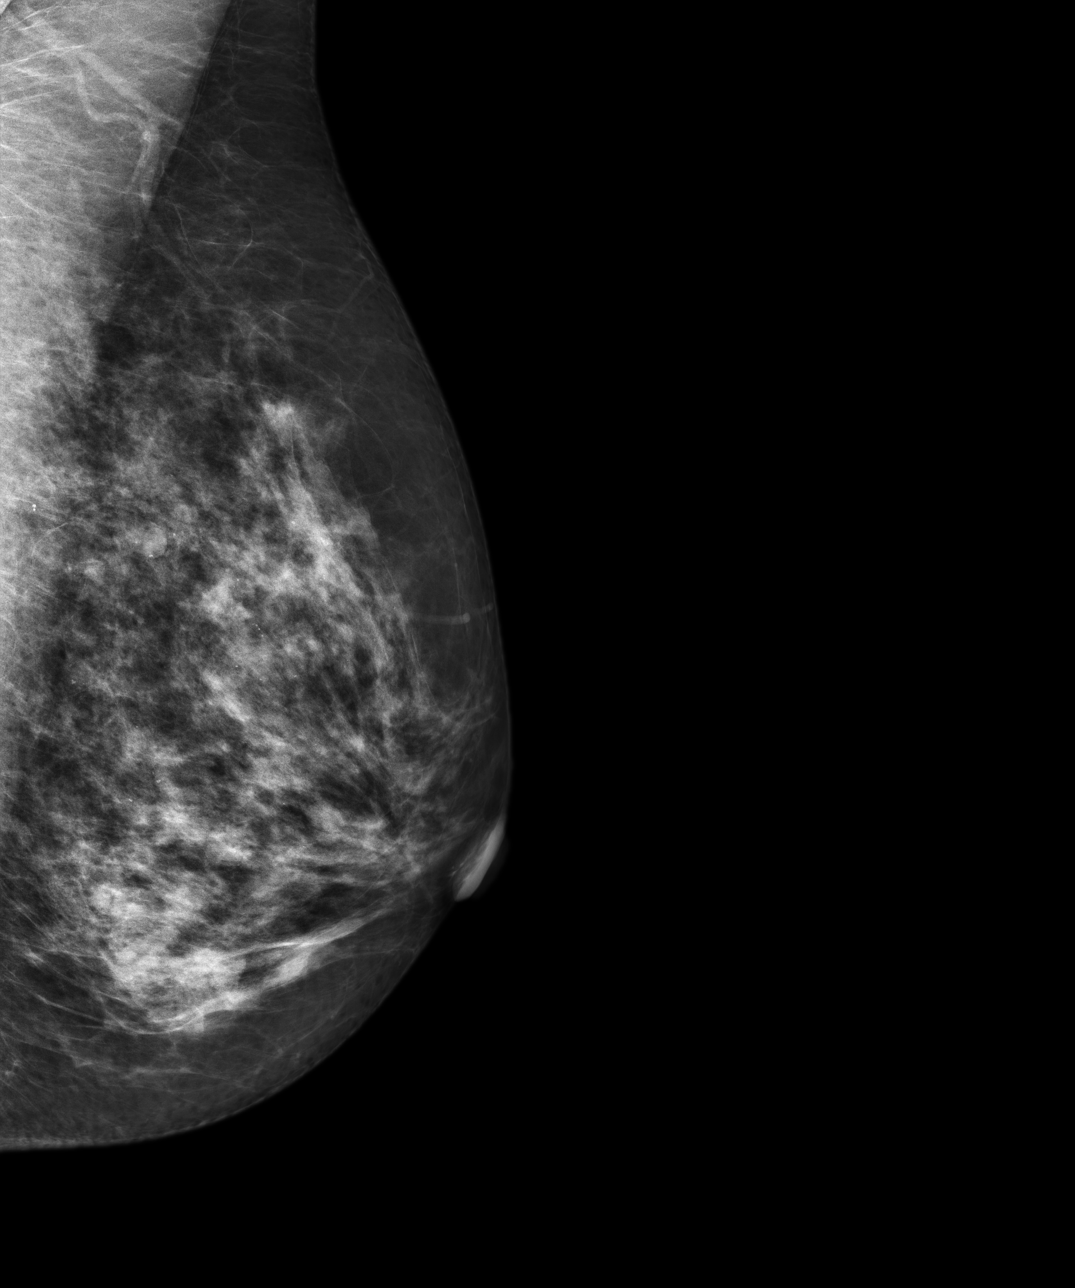

[4 of 4 positions shown; findings below may reference images not displayed]

ACR Breast Density Category c: The breasts are heterogeneously
dense, which may obscure small masses.
FINDINGS: In the left breast, a possible asymmetry warrants further evaluation
with spot compression views and possibly ultrasound. In the right
breast, no suspicious masses or malignant type calcifications are
identified. Images were processed with CAD.
IMPRESSION: Further evaluation is suggested for possible asymmetry in the left
breast.

RECOMMENDATION:
Diagnostic mammogram and possibly ultrasound of the left breast.
(Code:[K8])

The patient will be contacted regarding the findings, and additional
imaging will be scheduled.

BI-RADS CATEGORY  0: Incomplete. Need additional imaging evaluation
and/or prior mammograms for comparison.

## 2013-05-08 ENCOUNTER — Ambulatory Visit: Payer: Self-pay | Admitting: Family Medicine

## 2013-05-08 IMAGING — MG MM ADDITIONAL VIEWS AT NO CHARGE
1 series · 2 of 2 positions shown · non-contrast
Comparison: Previous examinations, including the screening
mammogram dated [DATE].

REASON FOR EXAM: av lt asymmetry
COMMENTS:

PROCEDURE:     MAM - MAM DGTL ADD VW LT  SCR  - [DATE]  [DATE]
CLINICAL DATA: Possible left breast asymmetry at recent screening
mammography.
EXAM:
MAMMOGRAPHIC ADDITIONAL VIEWS WITH CAD; ULTRASOUND LEFT BREAST

[L ML · left · 2 of 2 slices shown]
[im 1/2]
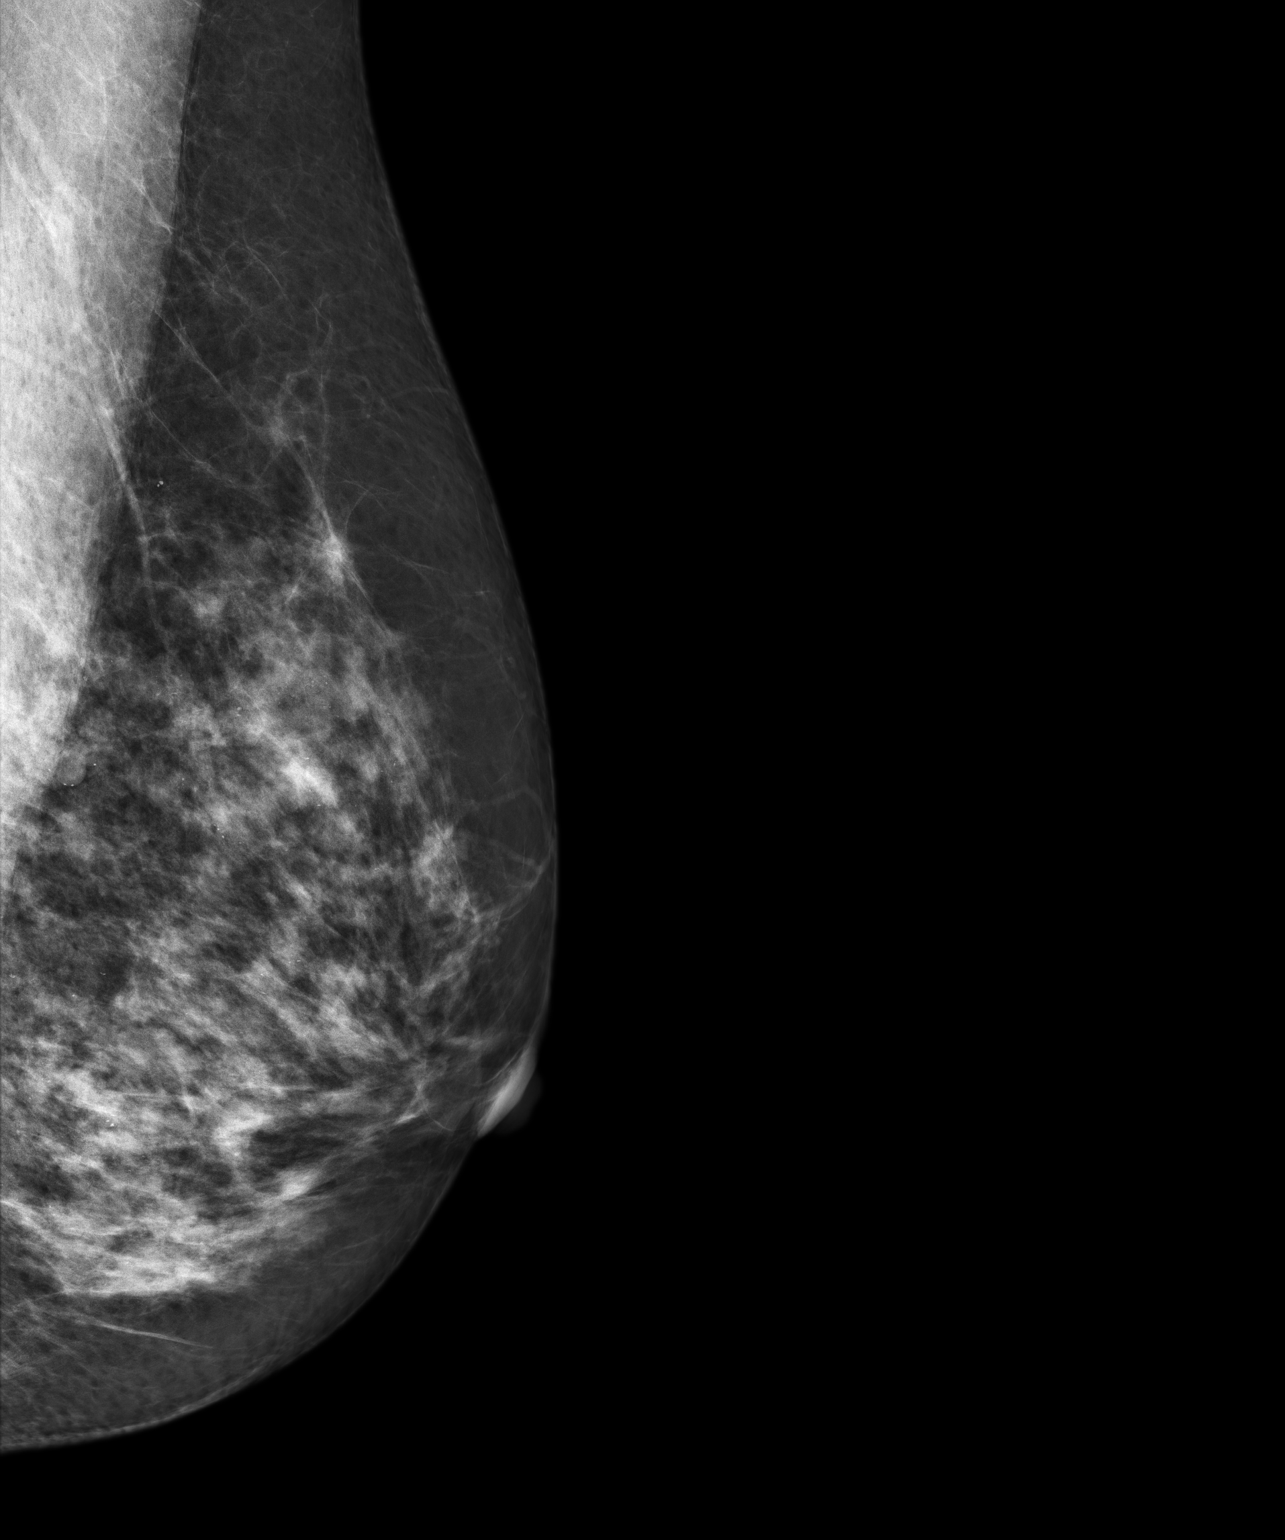
[im 2/2]
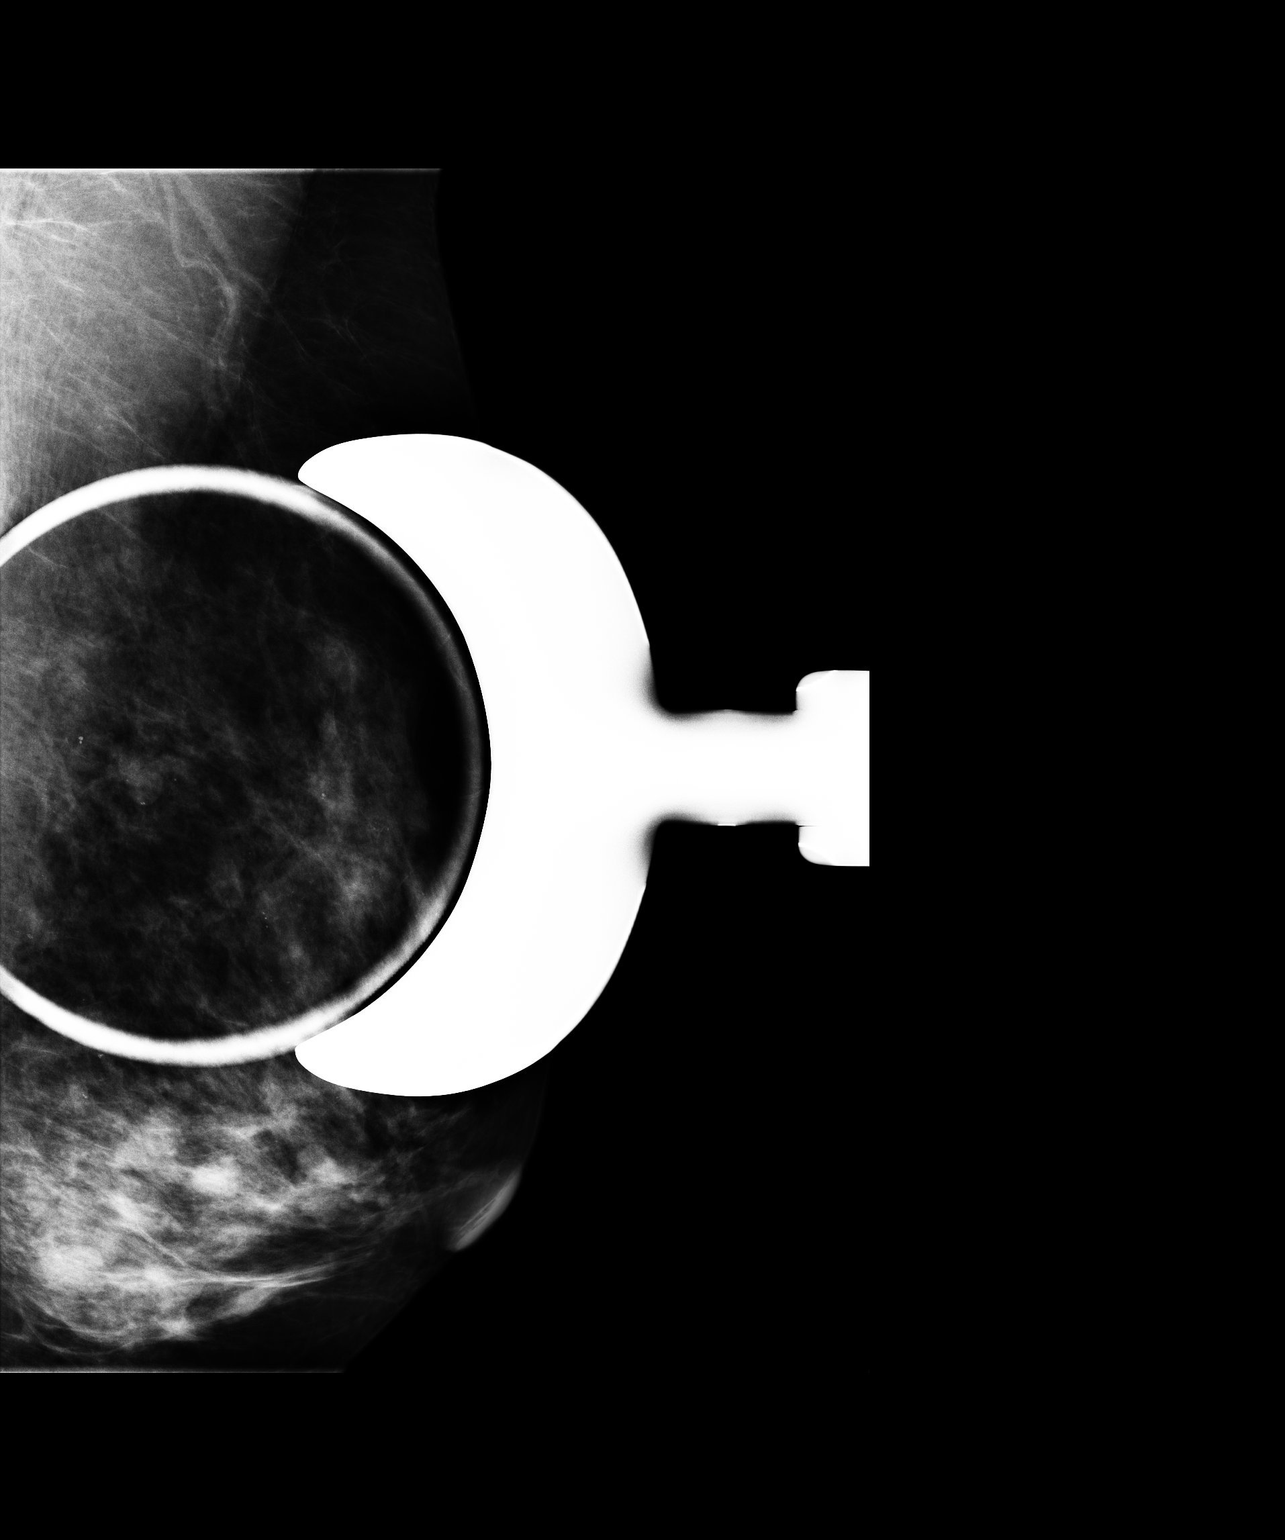

[2 of 2 positions shown; findings below may reference images not displayed]

ACR Breast Density Category c: The breasts are heterogeneously
dense, which may obscure small masses.
FINDINGS: True lateral and spot compression oblique views of the left breast
confirm a small, oval, circumscribed mass containing a small amount
of dependent microcalcification in the outer left breast slightly
superiorly.

Mammographic images were processed with CAD.

On physical exam,no mass is palpable in the upper outer left breast.

Ultrasound is performed, showing multiple small cysts in the upper
outer left breast. These measure 6 mm and less in maximum diameter
each. These include a 5 mm cyst containing dependent
microcalcification in the 2 o'clock position, 4 cm from the nipple,
corresponding to the mammographic mass.
IMPRESSION: Left breast cysts. No evidence of malignancy.

RECOMMENDATION:
Bilateral screening mammogram in 1 year.

I have discussed the findings and recommendations with the patient.
Results were also provided in writing at the conclusion of the
visit.

BI-RADS CATEGORY  2: Benign Finding(s)

## 2013-05-08 IMAGING — US ULTRASOUND LEFT BREAST
1 series · 13 of 15 positions shown · non-contrast
Comparison: Previous examinations, including the screening
mammogram dated [DATE].

REASON FOR EXAM: av lt asymmetry
COMMENTS:

PROCEDURE:     MAM - MAM US BREAST LEFT  - [DATE]  [DATE]
CLINICAL DATA: Possible left breast asymmetry at recent screening
mammography.
EXAM:
MAMMOGRAPHIC ADDITIONAL VIEWS WITH CAD; ULTRASOUND LEFT BREAST

[Series 1: ultrasound left breast · 0.08mm/px · 13 of 15 slices shown]
[im 1/15]
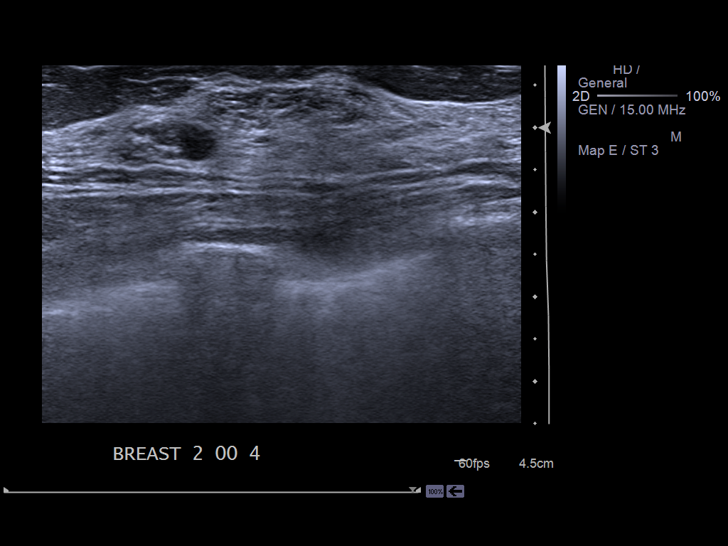
[im 2/15]
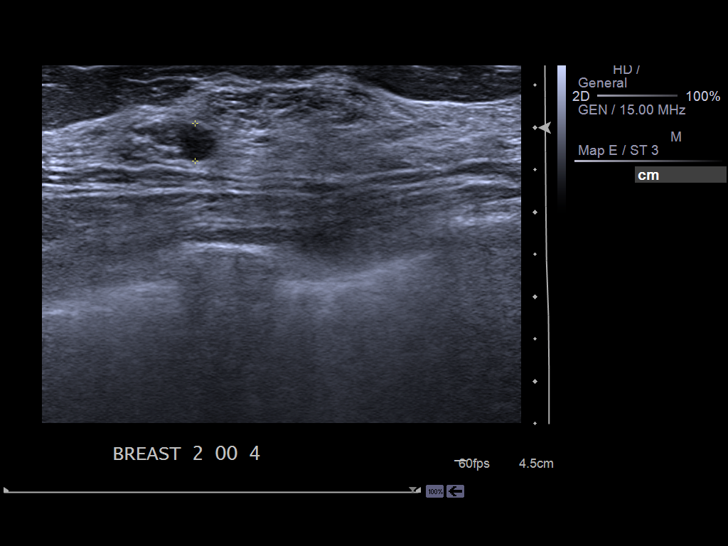
[im 3/15]
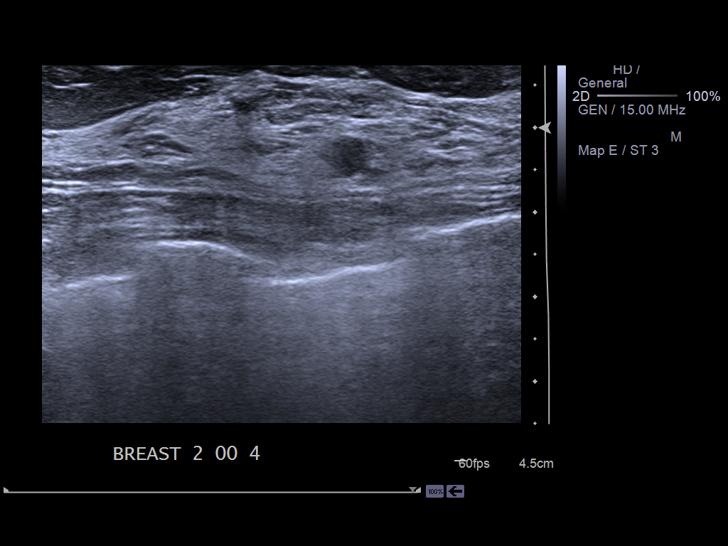
[im 5/15]
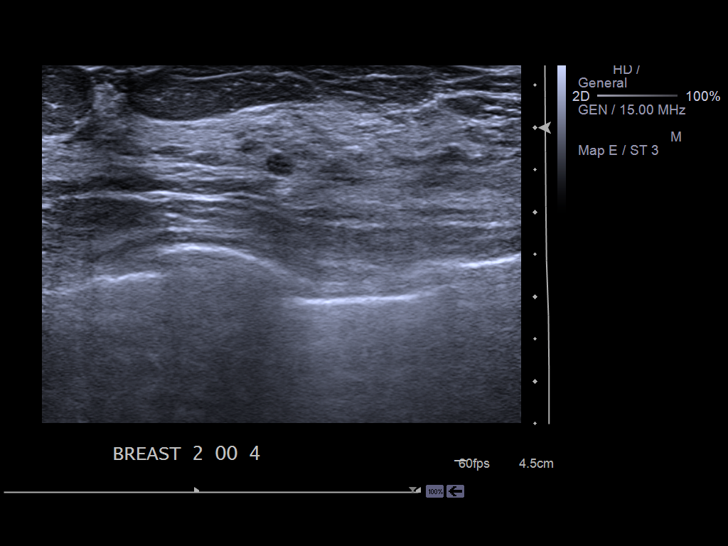
[im 6/15]
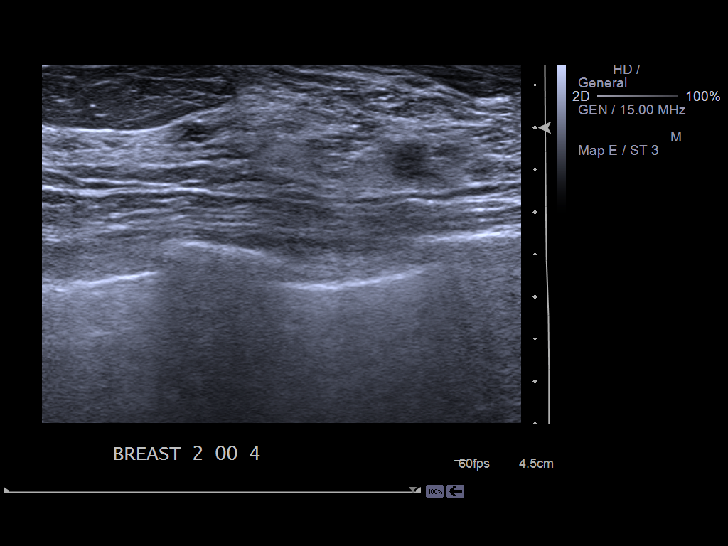
[im 7/15]
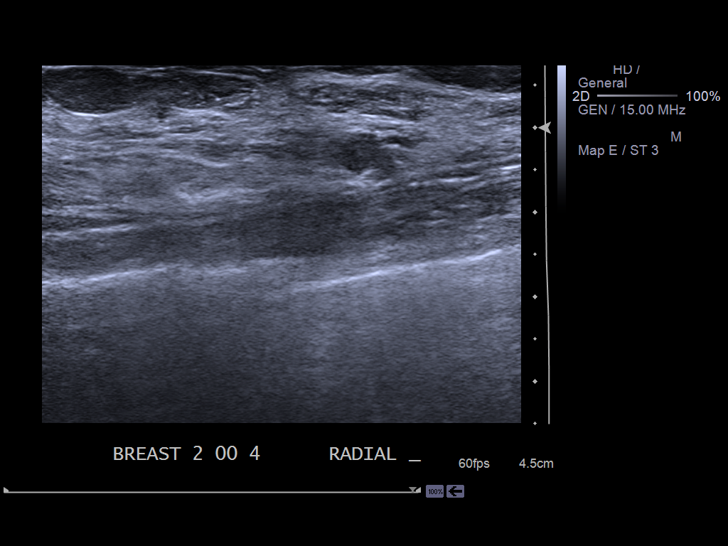
[im 8/15]
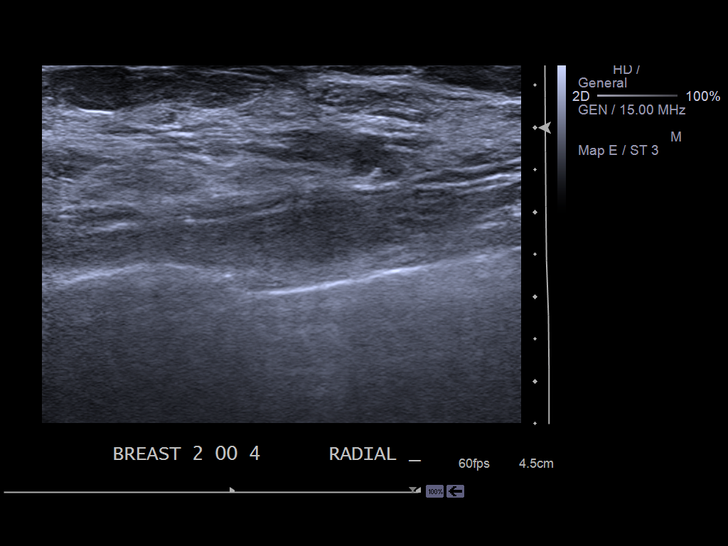
[im 9/15]
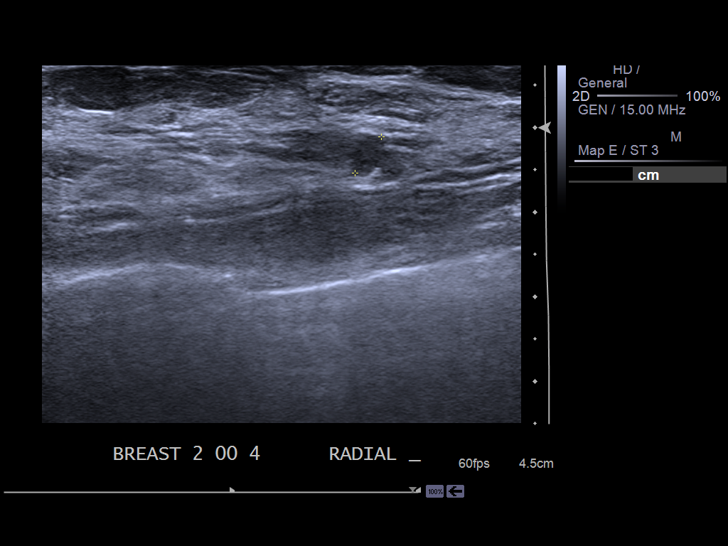
[im 10/15]
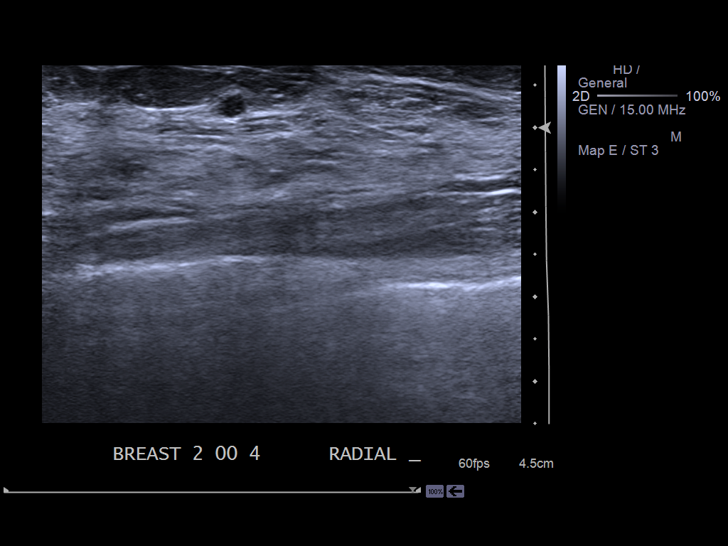
[im 11/15]
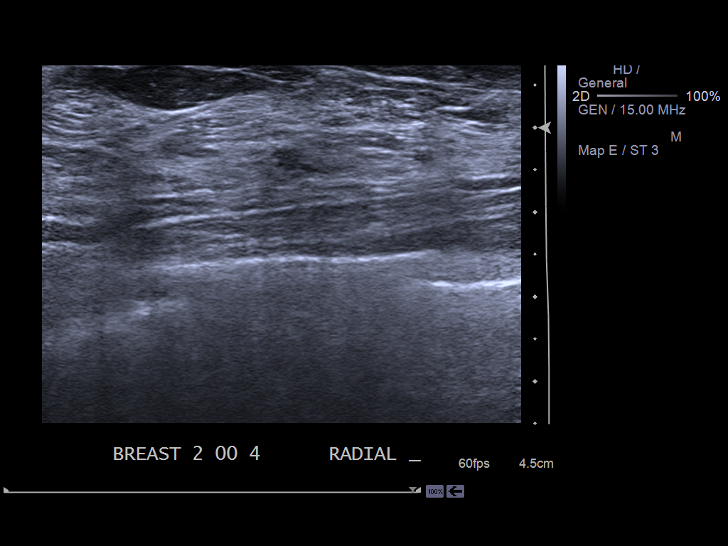
[im 13/15]
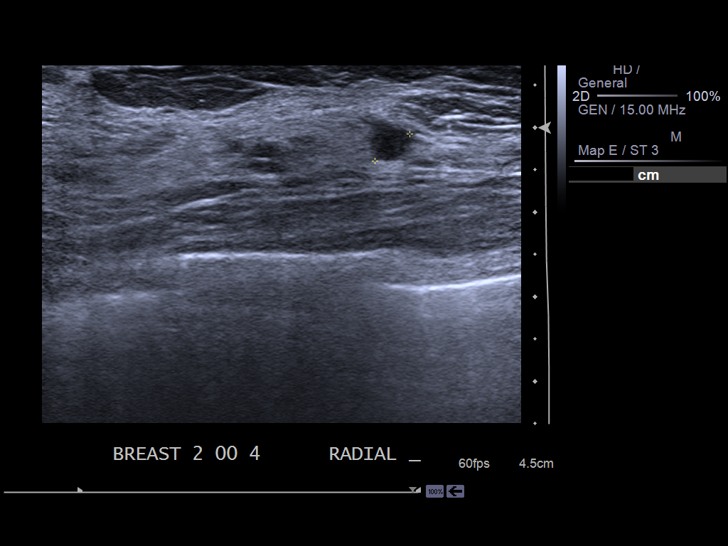
[im 14/15]
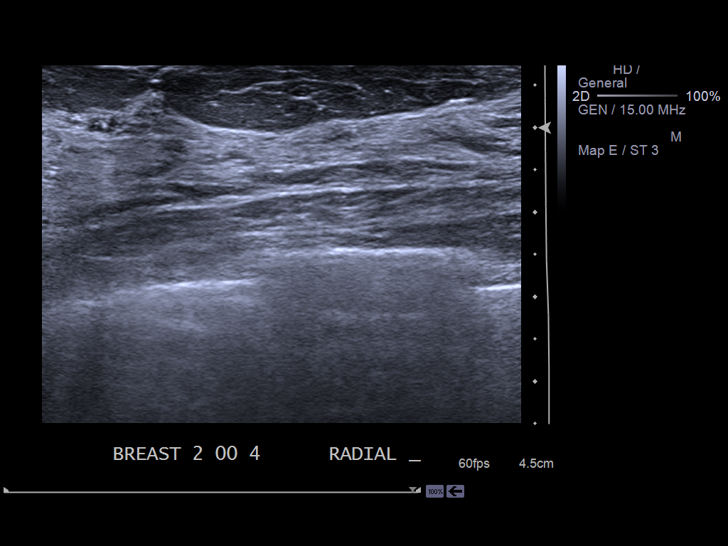
[im 15/15]
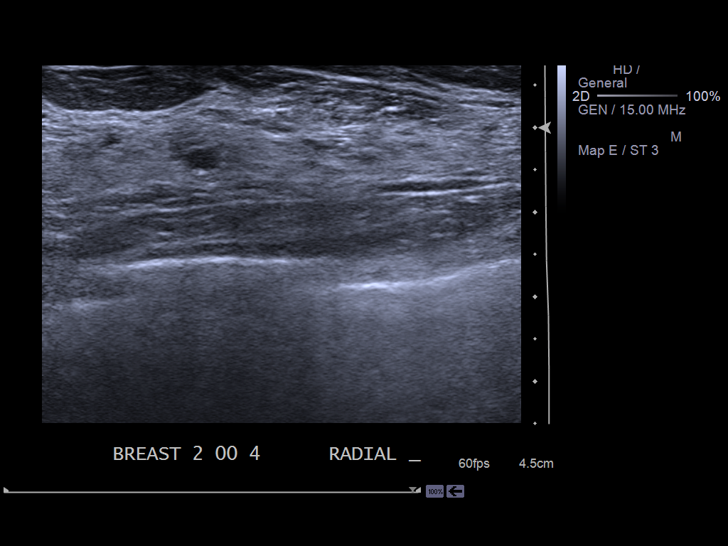

[13 of 15 positions shown; findings below may reference images not displayed]

ACR Breast Density Category c: The breasts are heterogeneously
dense, which may obscure small masses.
FINDINGS: True lateral and spot compression oblique views of the left breast
confirm a small, oval, circumscribed mass containing a small amount
of dependent microcalcification in the outer left breast slightly
superiorly.

Mammographic images were processed with CAD.

On physical exam,no mass is palpable in the upper outer left breast.

Ultrasound is performed, showing multiple small cysts in the upper
outer left breast. These measure 6 mm and less in maximum diameter
each. These include a 5 mm cyst containing dependent
microcalcification in the 2 o'clock position, 4 cm from the nipple,
corresponding to the mammographic mass.
IMPRESSION: Left breast cysts. No evidence of malignancy.

RECOMMENDATION:
Bilateral screening mammogram in 1 year.

I have discussed the findings and recommendations with the patient.
Results were also provided in writing at the conclusion of the
visit.

BI-RADS CATEGORY  2: Benign Finding(s)

## 2014-05-22 ENCOUNTER — Ambulatory Visit: Payer: Self-pay | Admitting: Family Medicine

## 2014-05-22 IMAGING — MG MM DIGITAL SCREENING BILAT W/ CAD
1 series · 4 of 4 positions shown · non-contrast
Comparison: Previous exam(s).

CLINICAL DATA: Screening.

EXAM:
DIGITAL SCREENING BILATERAL MAMMOGRAM WITH CAD

[R CC · right · 4 of 4 slices shown]
[im 1/4]
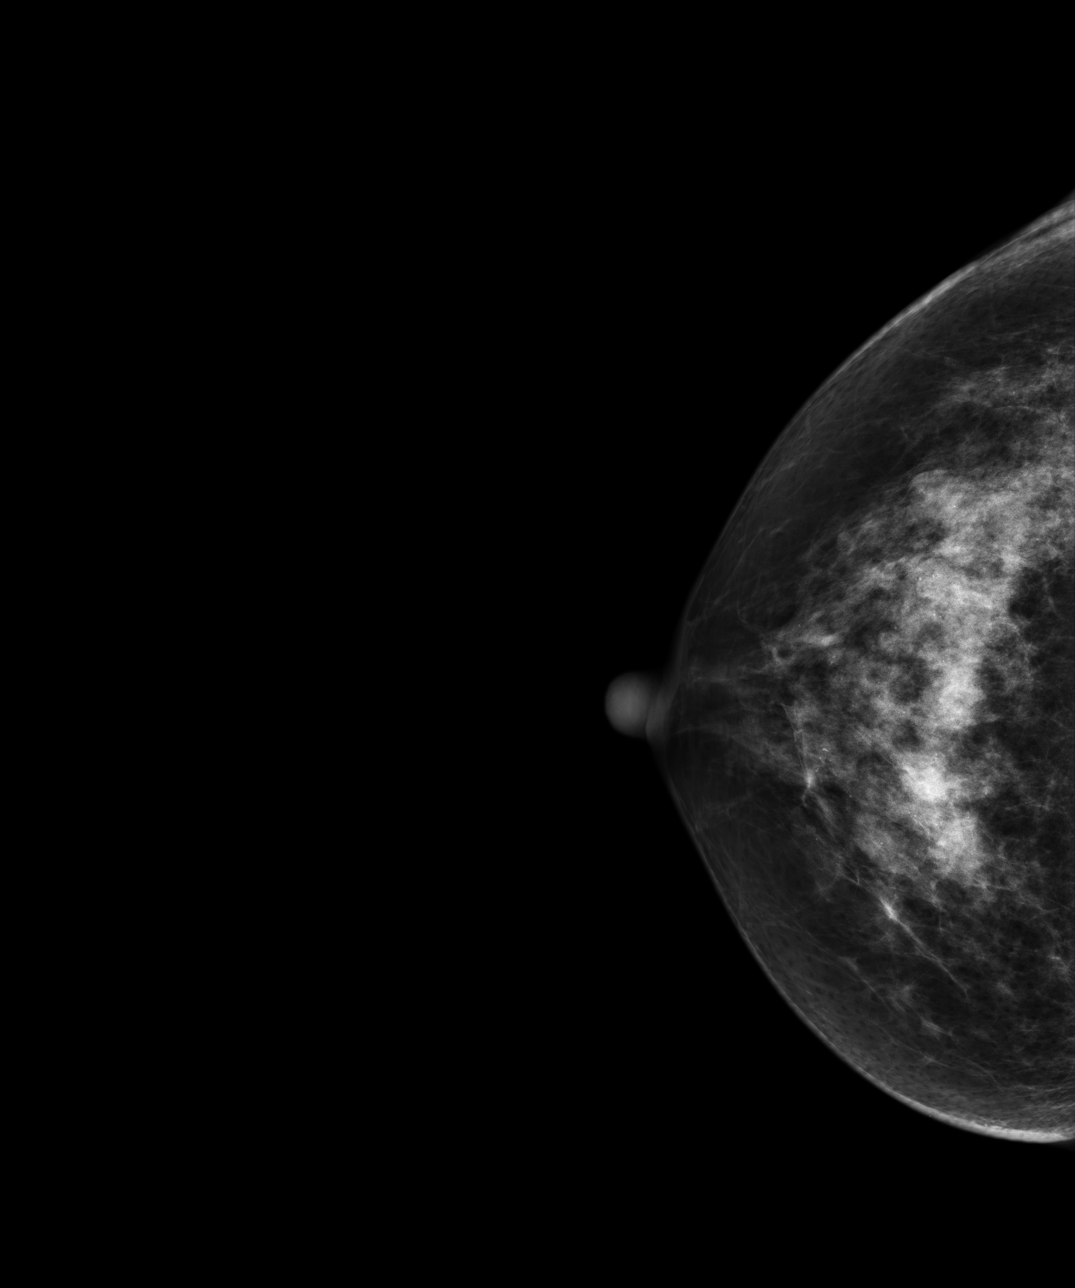
[im 2/4]
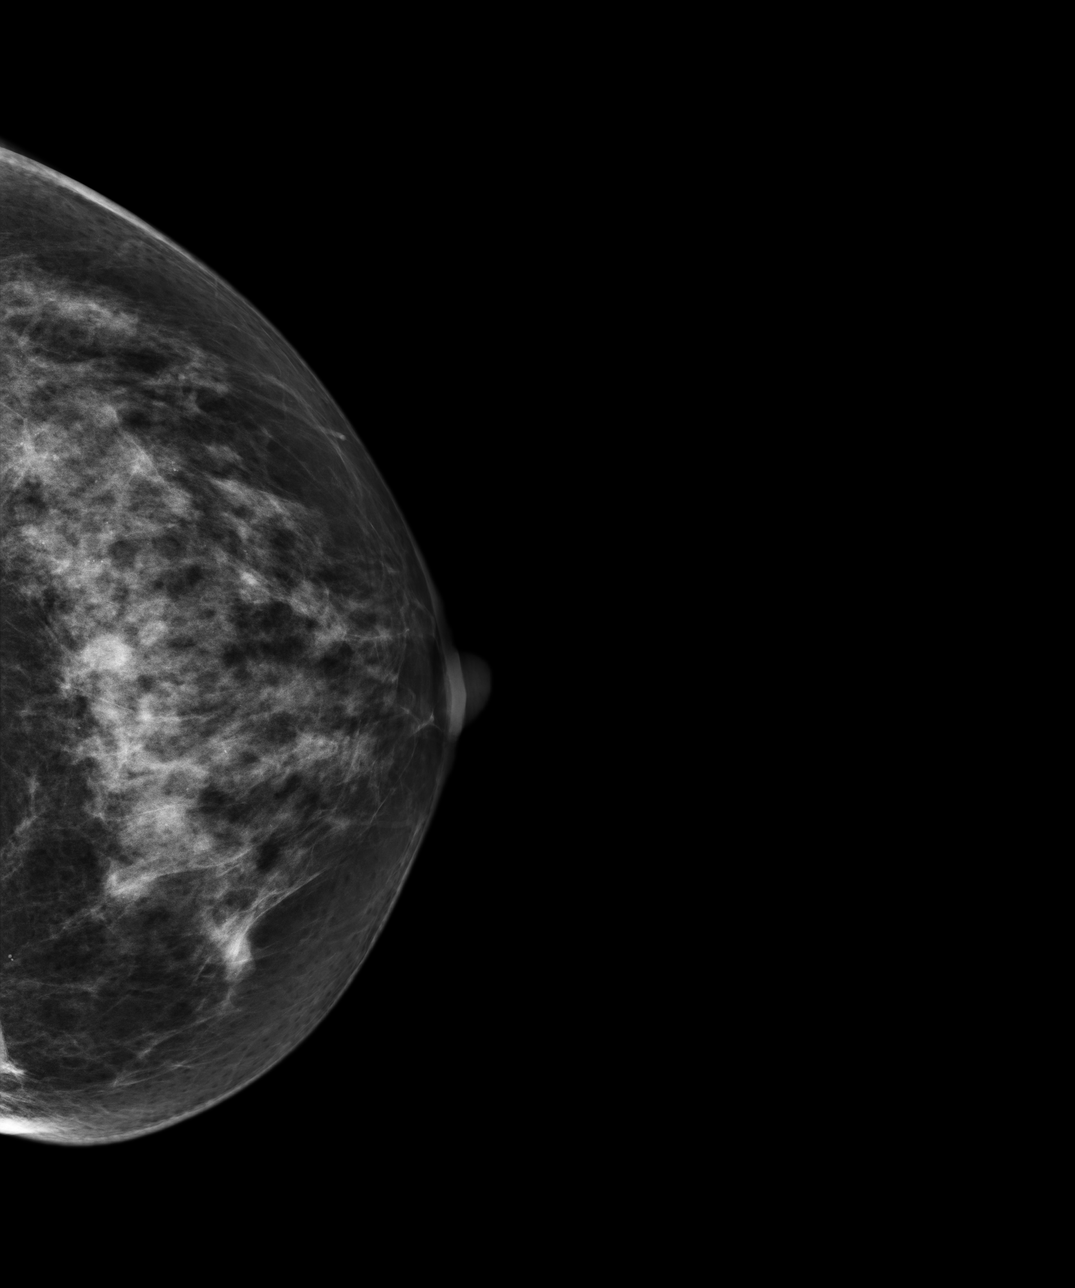
[im 3/4]
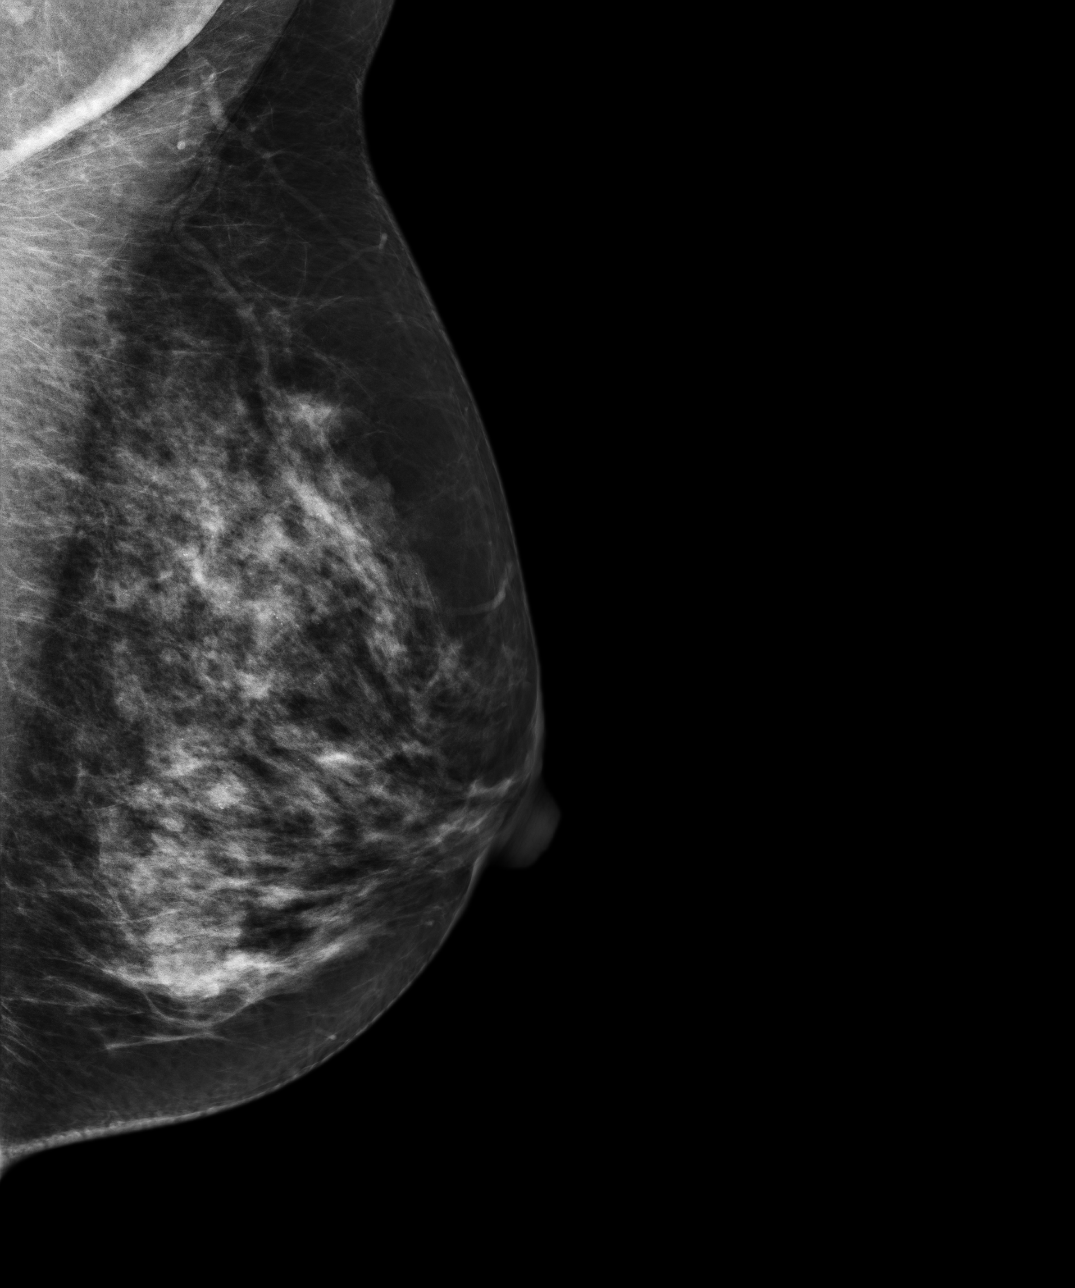
[im 4/4]
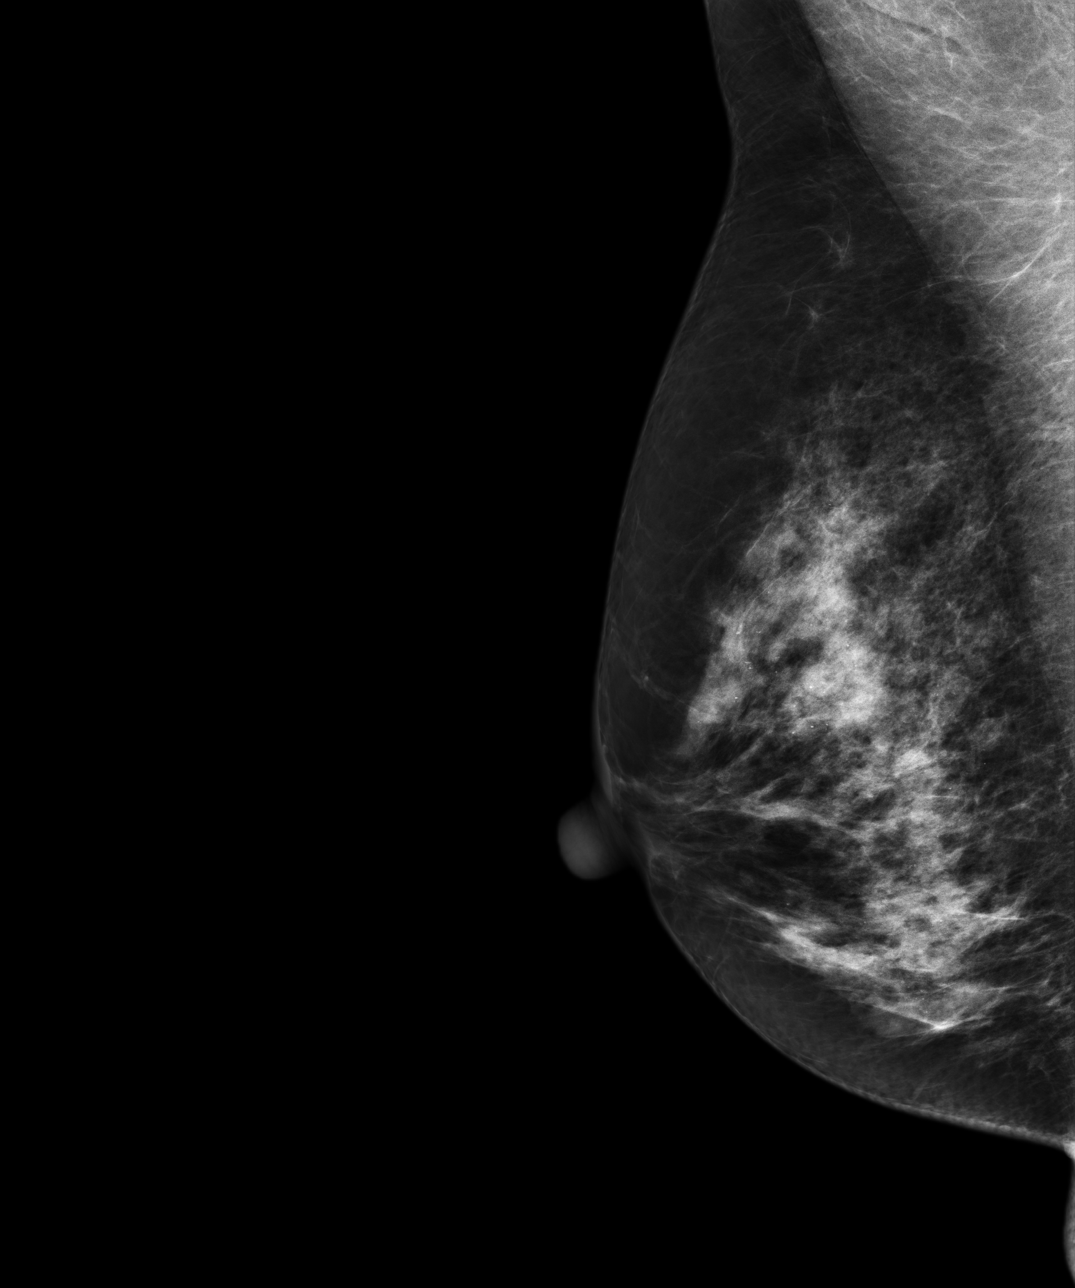

[4 of 4 positions shown; findings below may reference images not displayed]

ACR Breast Density Category c: The breast tissue is heterogeneously
dense, which may obscure small masses.
FINDINGS: There are no findings suspicious for malignancy. Images were
processed with CAD.
IMPRESSION: No mammographic evidence of malignancy. A result letter of this
screening mammogram will be mailed directly to the patient.

RECOMMENDATION:
Screening mammogram in one year. (Code:[0J])

BI-RADS CATEGORY  1: Negative.

## 2015-05-06 ENCOUNTER — Other Ambulatory Visit: Payer: Self-pay | Admitting: Family Medicine

## 2015-05-06 DIAGNOSIS — Z1231 Encounter for screening mammogram for malignant neoplasm of breast: Secondary | ICD-10-CM

## 2015-05-26 ENCOUNTER — Other Ambulatory Visit: Payer: Self-pay | Admitting: Family Medicine

## 2015-05-26 ENCOUNTER — Ambulatory Visit
Admission: RE | Admit: 2015-05-26 | Discharge: 2015-05-26 | Disposition: A | Discharge status: Home / Self Care | Payer: BLUE CROSS/BLUE SHIELD | Source: Ambulatory Visit | Attending: Family Medicine | Admitting: Family Medicine

## 2015-05-26 DIAGNOSIS — Z1231 Encounter for screening mammogram for malignant neoplasm of breast: Secondary | ICD-10-CM | POA: Diagnosis not present

## 2015-05-26 IMAGING — MG MM DIGITAL SCREENING BILATERAL
5 series · 5 of 5 positions shown · non-contrast
Comparison: Previous exam(s).

CLINICAL DATA: Screening.

EXAM:
DIGITAL SCREENING BILATERAL MAMMOGRAM WITH CAD

[R MLO]
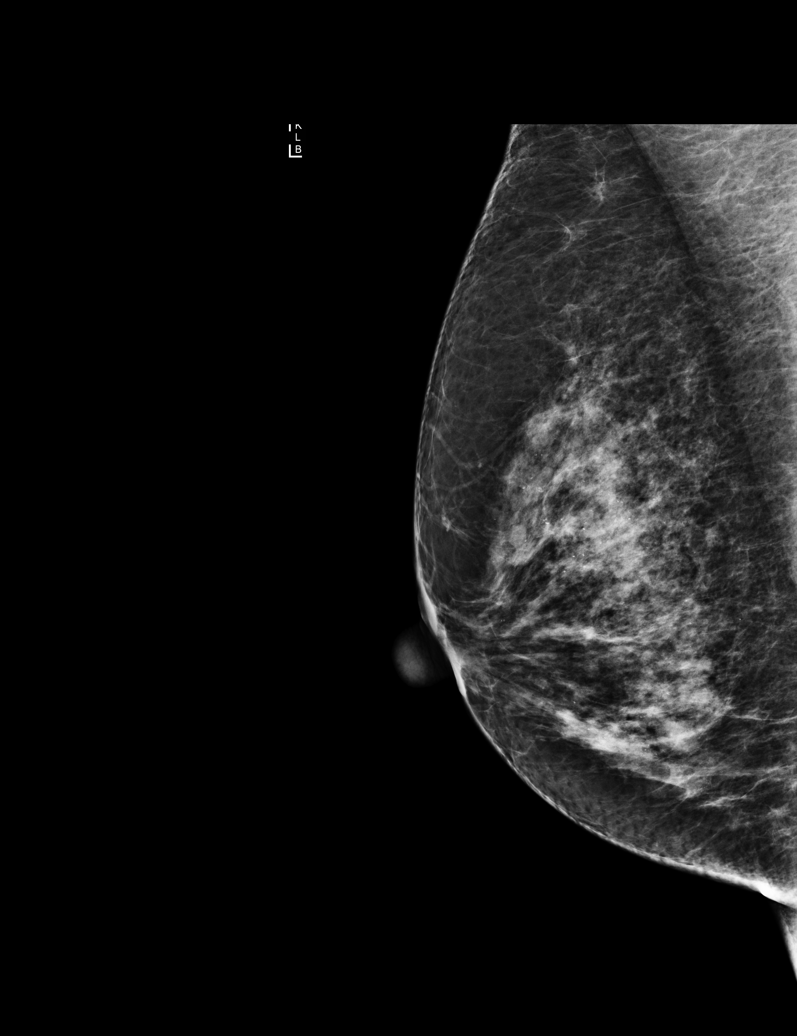

[R CC (1 of 2)]
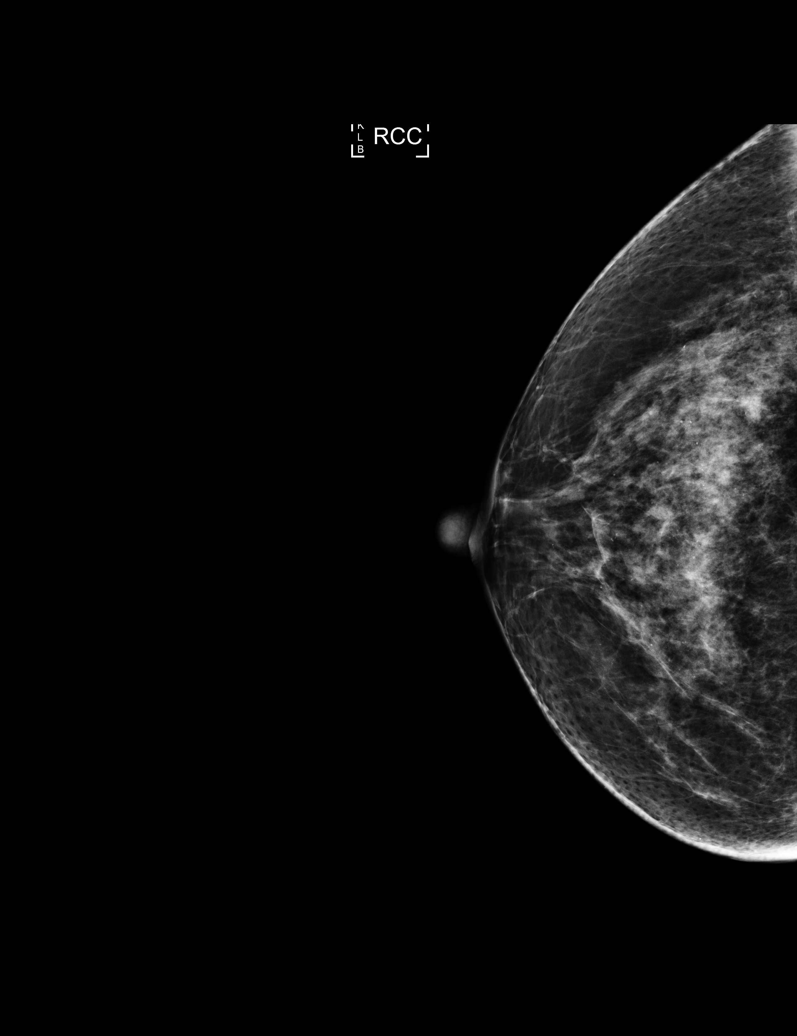

[L CC]
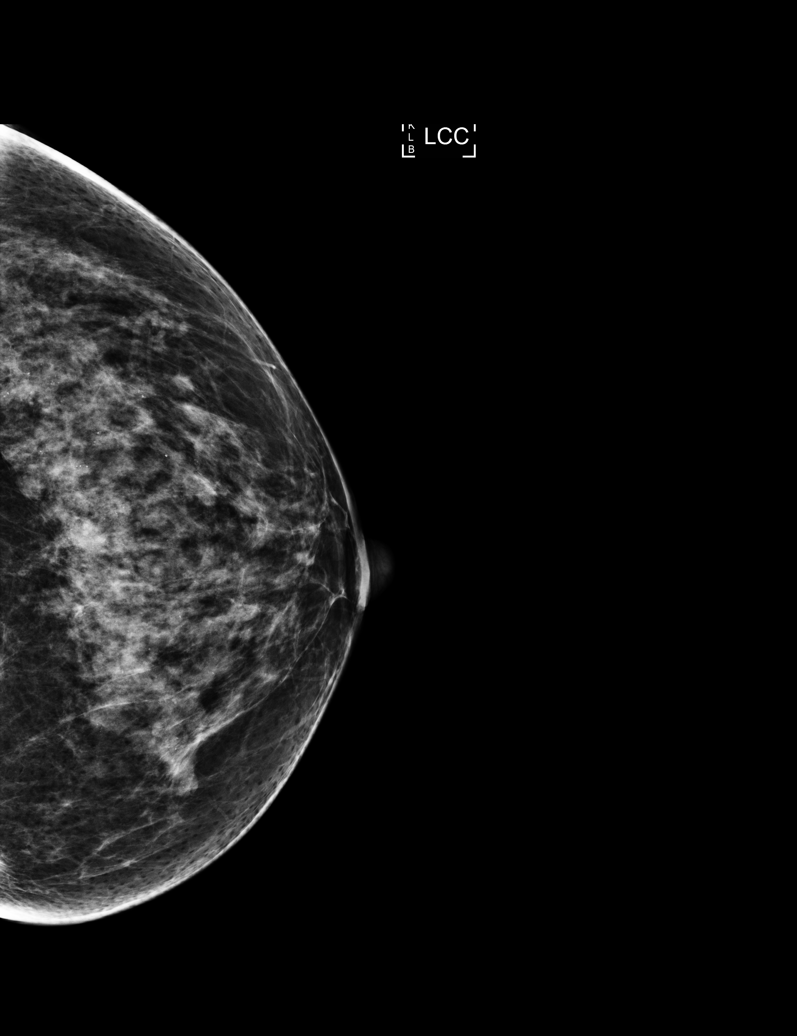

[L MLO]
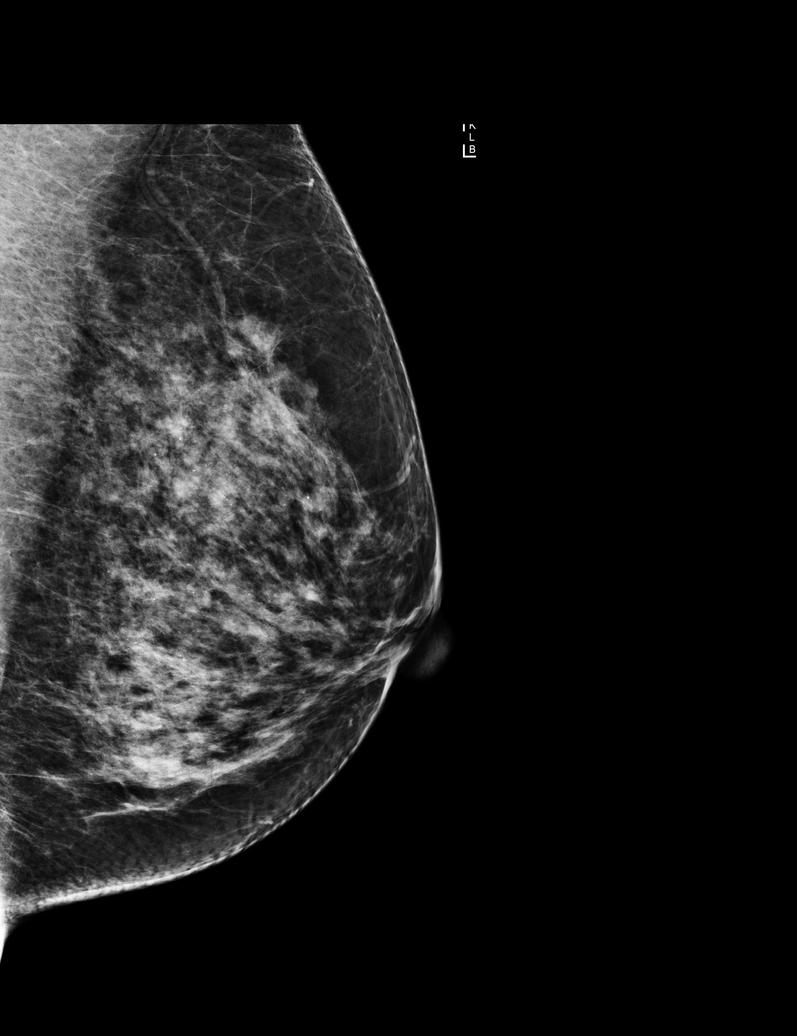

[R CC (2 of 2)]
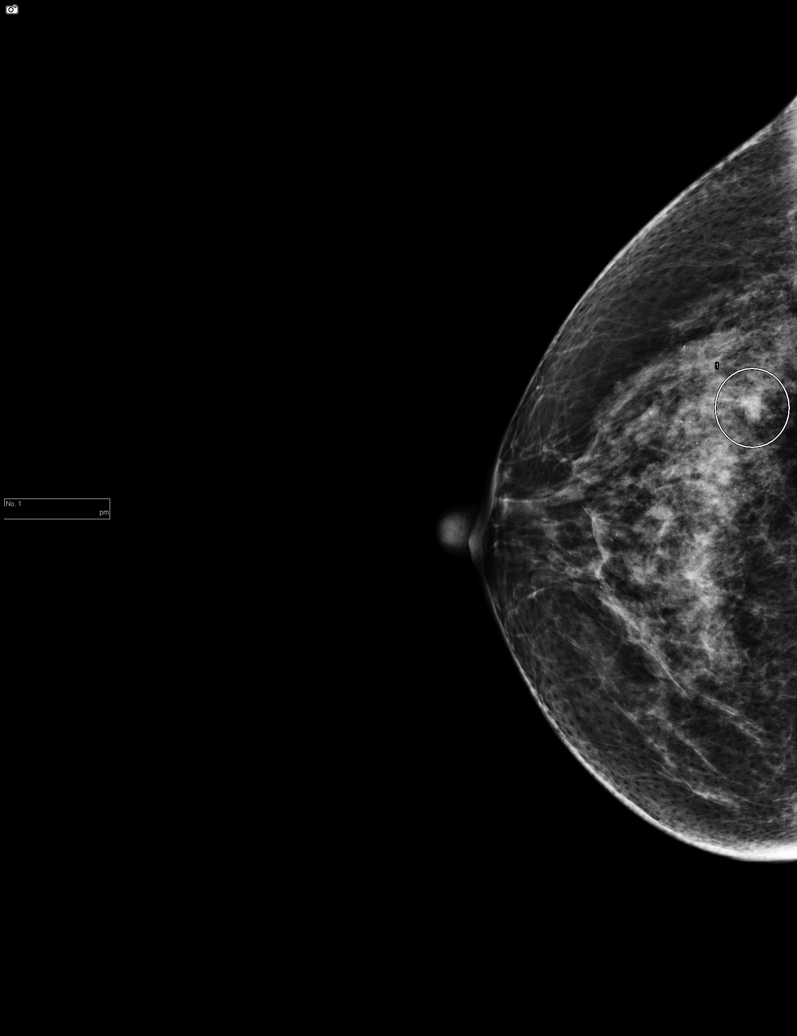

[5 of 5 positions shown; findings below may reference images not displayed]

ACR Breast Density Category d: The breast tissue is extremely dense,
which lowers the sensitivity of mammography.
FINDINGS: In the right breast, a possible asymmetry warrants further
evaluation. In the left breast, no findings suspicious for
malignancy. Images were processed with CAD.
IMPRESSION: Further evaluation is suggested for possible asymmetry in the right
breast.

RECOMMENDATION:
Diagnostic mammogram and possibly ultrasound of the right breast.
(Code:[WF])

The patient will be contacted regarding the findings, and additional
imaging will be scheduled.

BI-RADS CATEGORY  0: Incomplete. Need additional imaging evaluation
and/or prior mammograms for comparison.

## 2015-05-27 ENCOUNTER — Other Ambulatory Visit: Payer: Self-pay | Admitting: Family Medicine

## 2015-05-27 DIAGNOSIS — N6489 Other specified disorders of breast: Secondary | ICD-10-CM

## 2015-06-04 ENCOUNTER — Ambulatory Visit
Admission: RE | Admit: 2015-06-04 | Discharge: 2015-06-04 | Disposition: A | Discharge status: Home / Self Care | Payer: BLUE CROSS/BLUE SHIELD | Source: Ambulatory Visit | Attending: Family Medicine | Admitting: Family Medicine

## 2015-06-04 DIAGNOSIS — N6489 Other specified disorders of breast: Secondary | ICD-10-CM | POA: Diagnosis present

## 2015-06-04 IMAGING — MG MM DIAG BREAST TOMO UNI RIGHT
6 series · 6 of 14 positions shown · non-contrast
Comparison: Previous exam(s).

CLINICAL DATA: Patient recalled from screening for right breast
asymmetry.

EXAM:
DIGITAL DIAGNOSTIC RIGHT MAMMOGRAM WITH 3D TOMOSYNTHESIS AND CAD

[R MLO synth-2D]
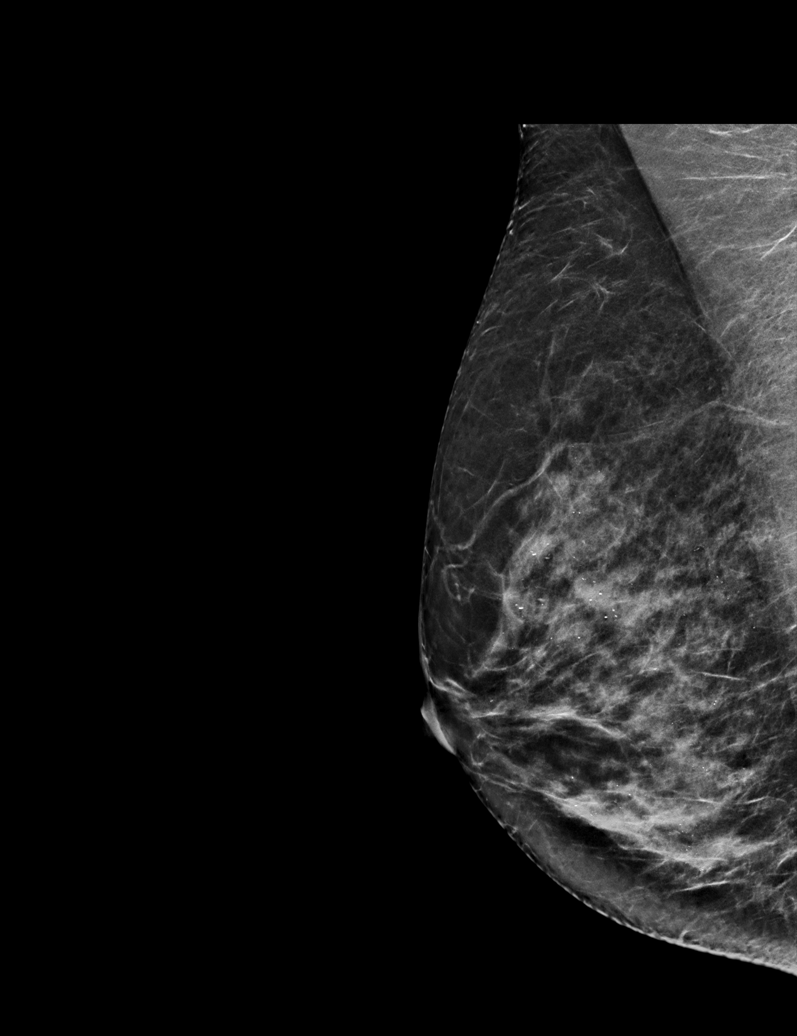

[R CC synth-2D]
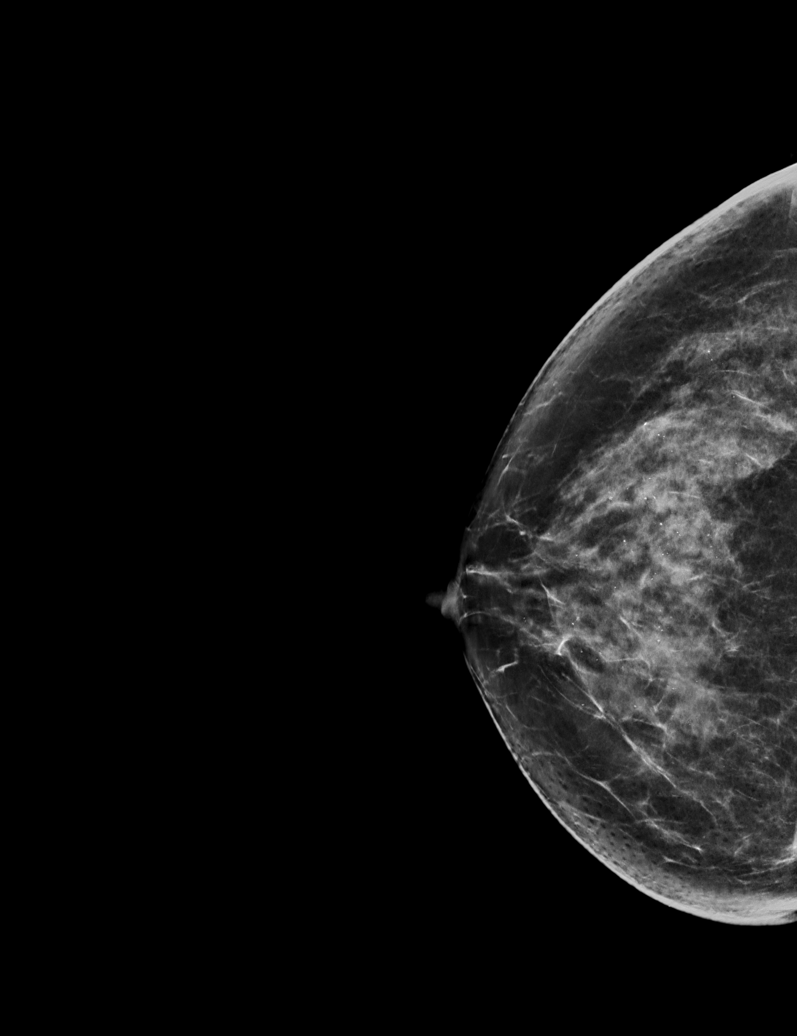

[R CC]
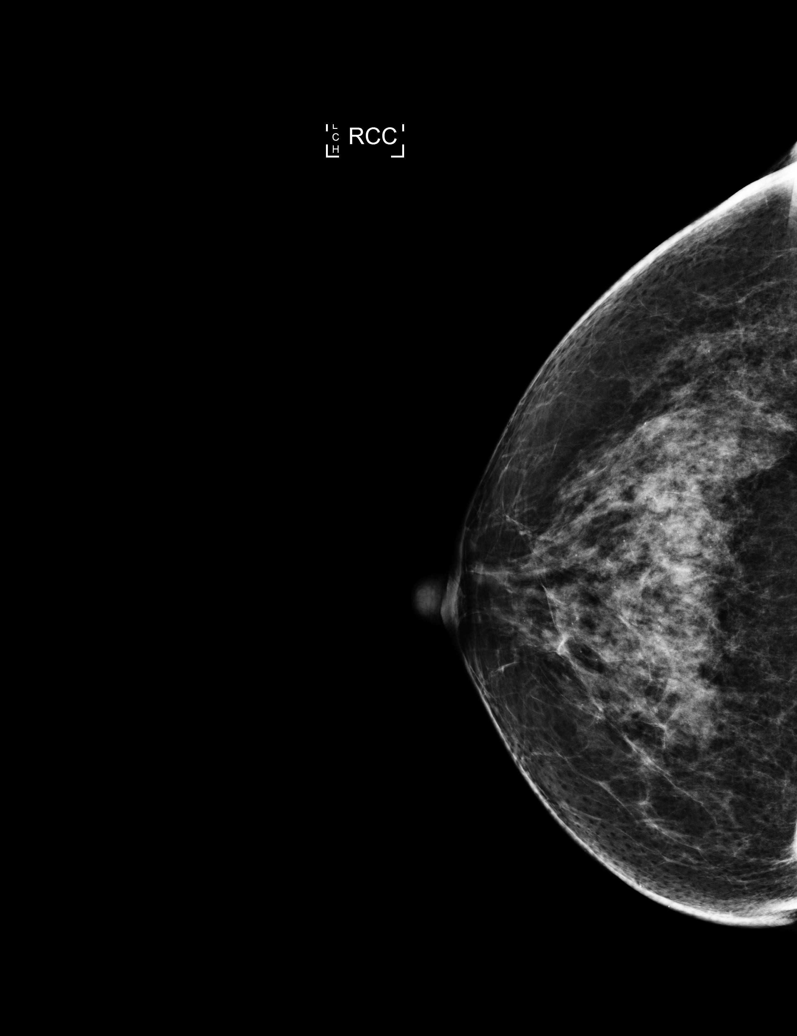

[R MLO]
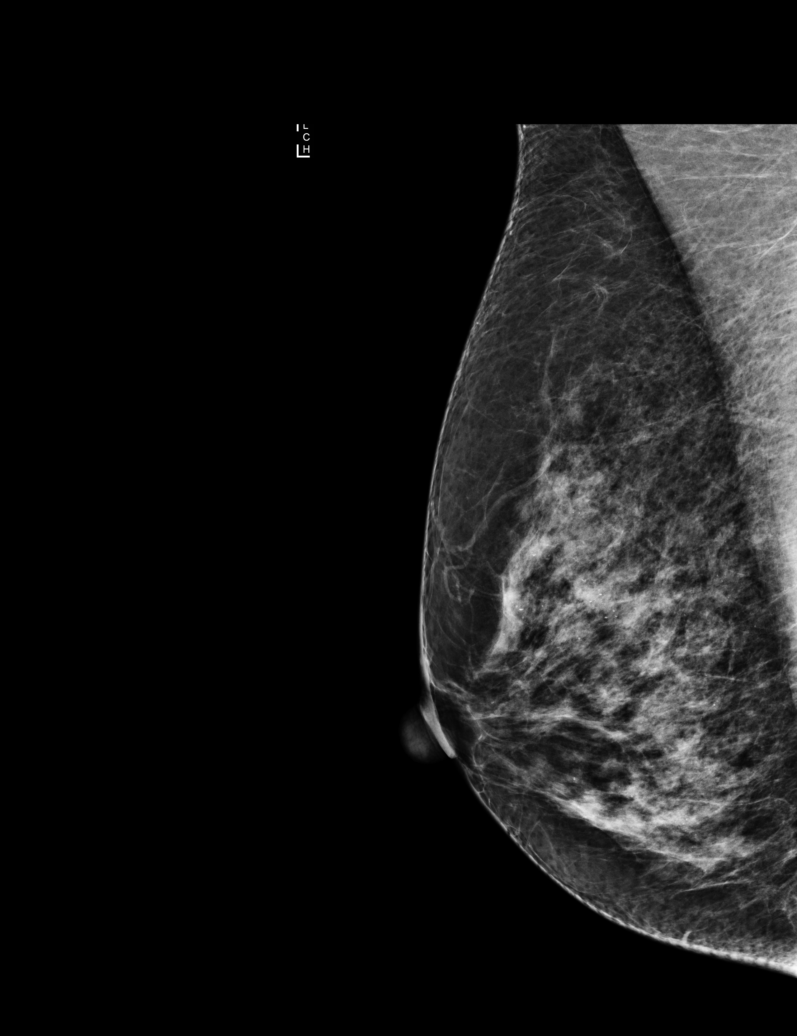

[R MLO tomo · tomo slice 31/62.0]
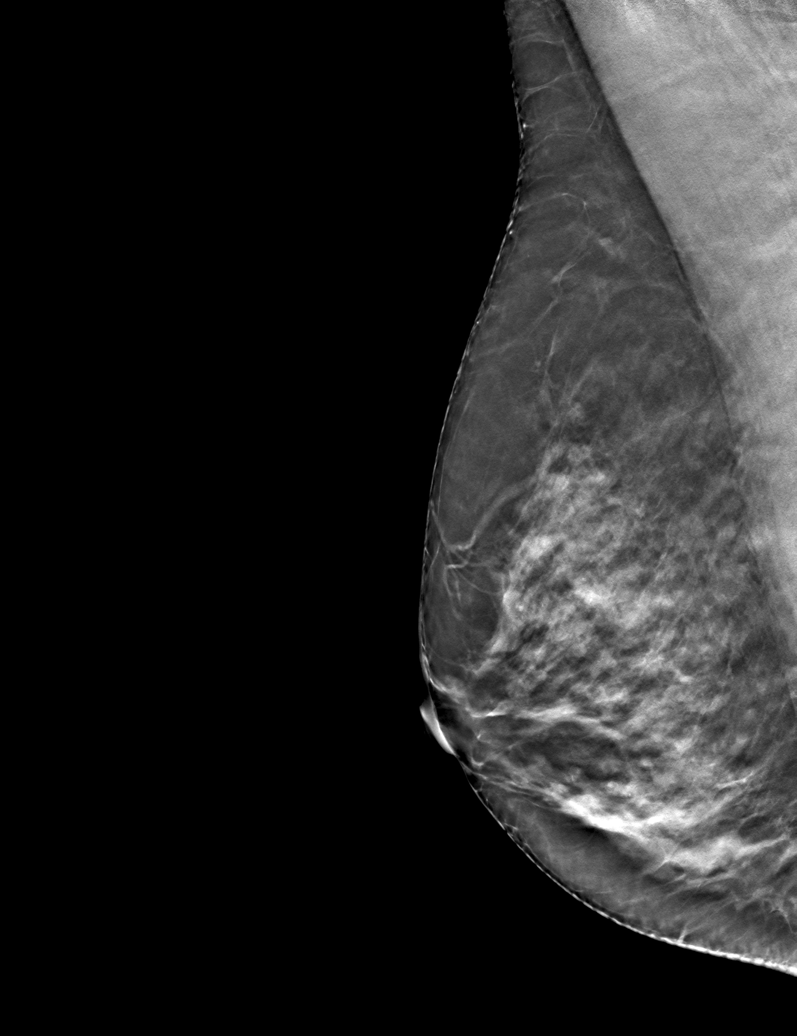

[R CC tomo · tomo slice 35/69.0]
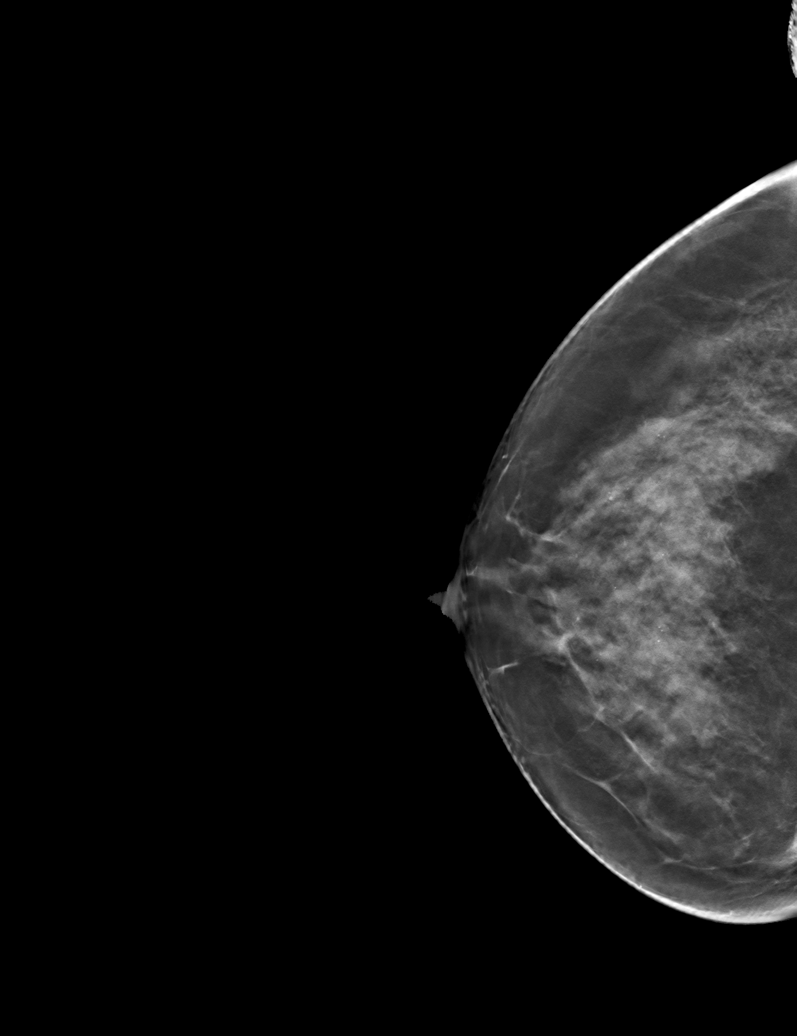

[6 of 14 positions shown; findings below may reference images not displayed]

ACR Breast Density Category c: The breast tissue is heterogeneously
dense, which may obscure small masses.
FINDINGS: CC and MLO tomosynthesis images of the right breast were obtained.
The questioned asymmetry within the lateral right breast posteriorly
on the CC view resolved with additional imaging compatible with
overlapping fibroglandular breast tissue.

Mammographic images were processed with CAD.
IMPRESSION: Right breast asymmetry resolved, compatible with overlapping
fibroglandular breast tissue.

No mammographic evidence for malignancy.

RECOMMENDATION:
Screening mammogram in one year.(Code:[DK])

I have discussed the findings and recommendations with the patient.
Results were also provided in writing at the conclusion of the
visit. If applicable, a reminder letter will be sent to the patient
regarding the next appointment.

BI-RADS CATEGORY  1: Negative.

## 2017-01-24 ENCOUNTER — Other Ambulatory Visit: Payer: Self-pay | Admitting: Family Medicine

## 2017-01-24 DIAGNOSIS — Z1231 Encounter for screening mammogram for malignant neoplasm of breast: Secondary | ICD-10-CM

## 2017-02-07 ENCOUNTER — Ambulatory Visit
Admission: RE | Admit: 2017-02-07 | Discharge: 2017-02-07 | Disposition: A | Discharge status: Home / Self Care | Payer: Medicare Other | Source: Ambulatory Visit | Attending: Family Medicine | Admitting: Family Medicine

## 2017-02-07 DIAGNOSIS — Z1231 Encounter for screening mammogram for malignant neoplasm of breast: Secondary | ICD-10-CM | POA: Diagnosis present

## 2017-02-07 IMAGING — MG DIGITAL SCREENING BILATERAL MAMMOGRAM WITH CAD
5 series · 5 of 5 positions shown · non-contrast
Comparison: Previous exam(s).

CLINICAL DATA: Screening.

EXAM:
DIGITAL SCREENING BILATERAL MAMMOGRAM WITH CAD

[R CC]
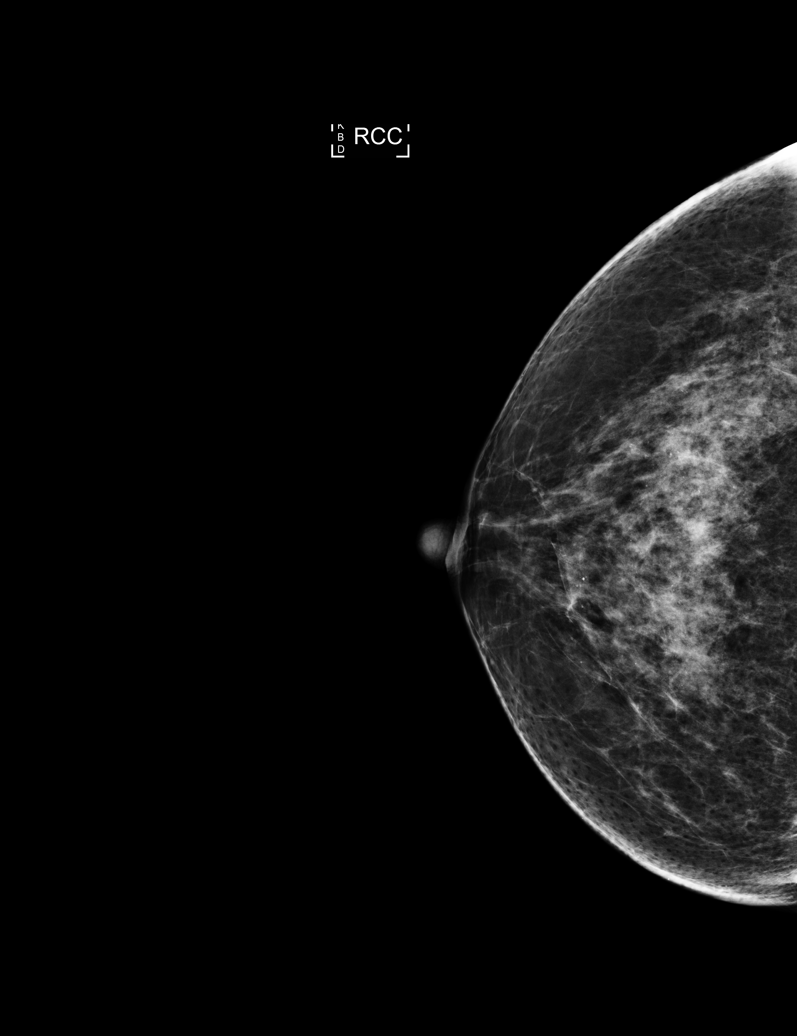

[L MLO]
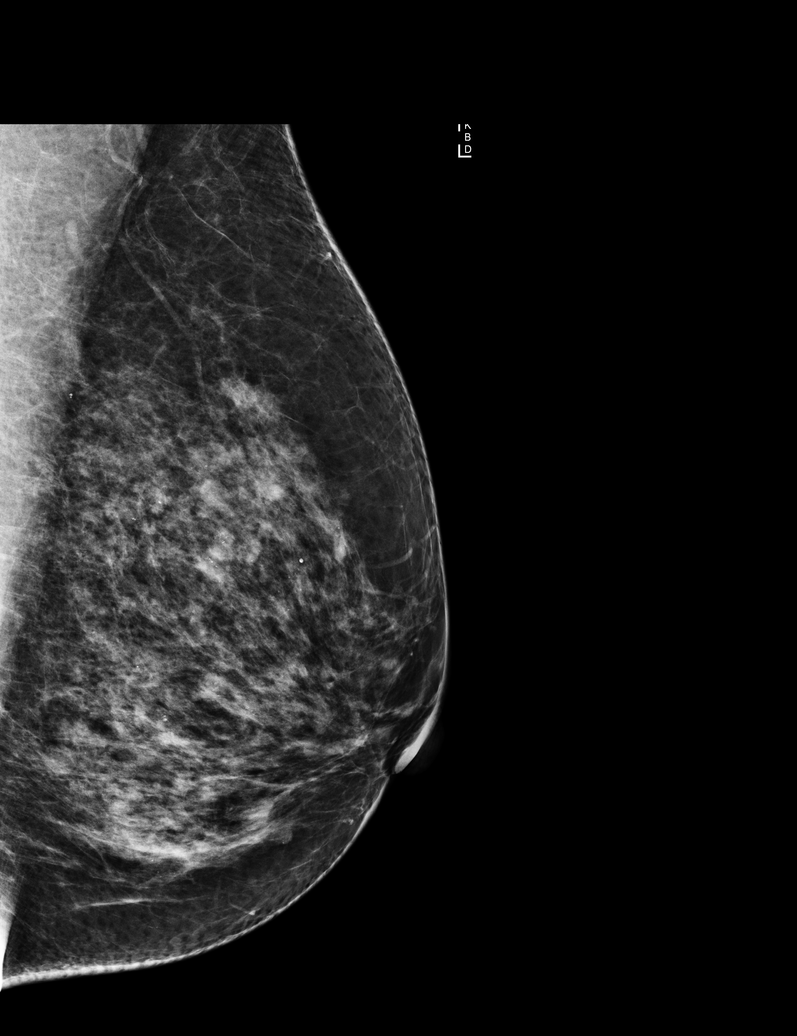

[R MLO (1 of 2)]
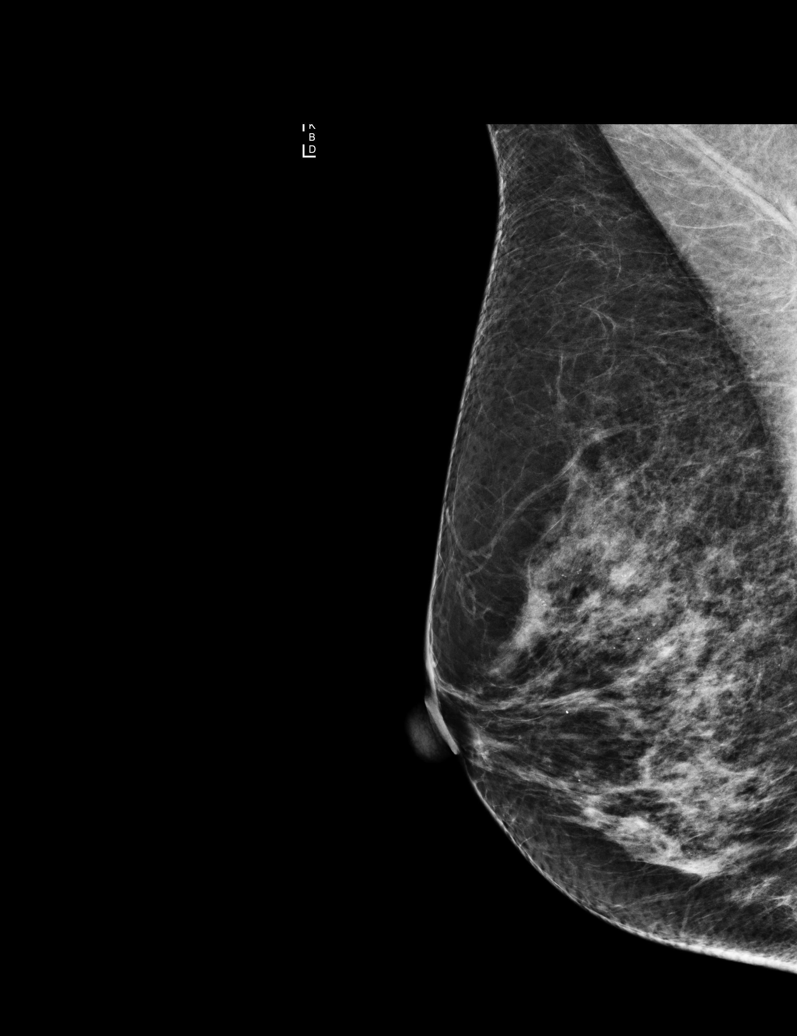

[R MLO (2 of 2)]
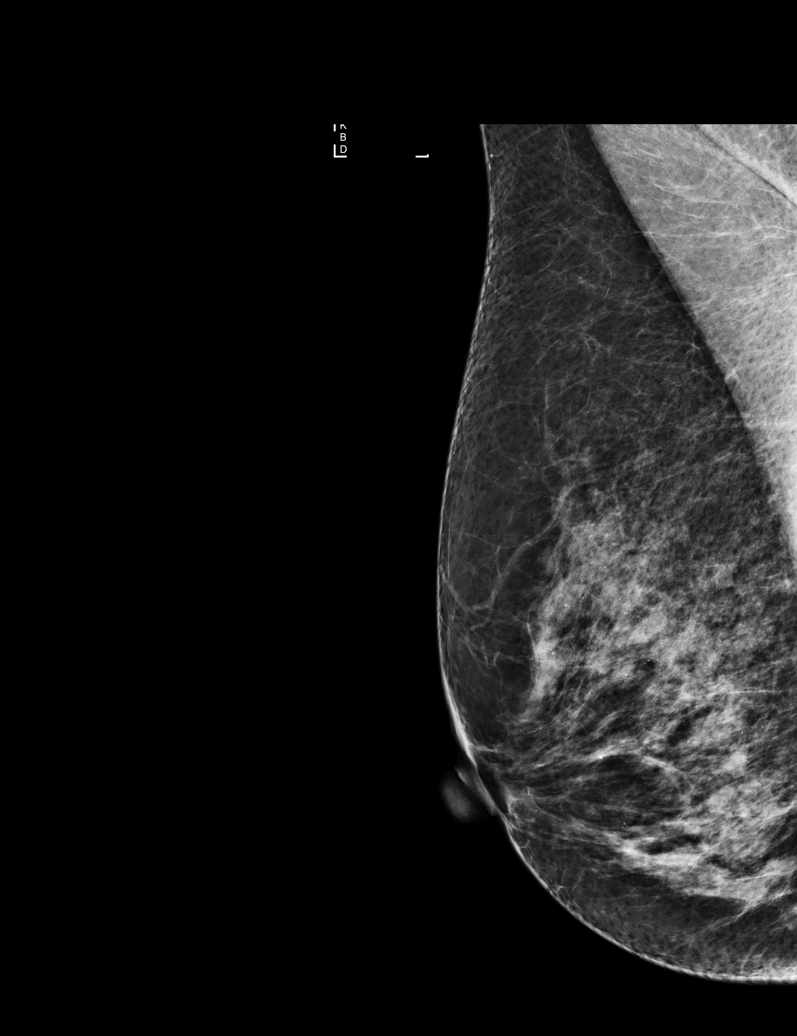

[L CC]
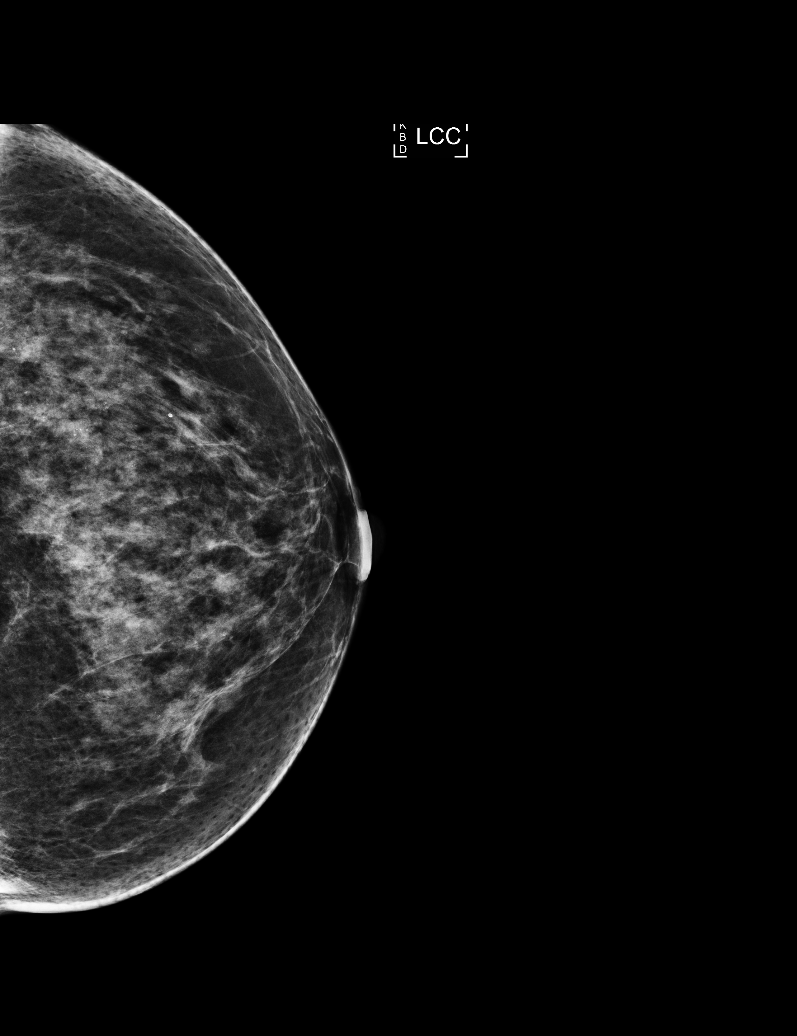

[5 of 5 positions shown; findings below may reference images not displayed]

ACR Breast Density Category c: The breast tissue is heterogeneously
dense, which may obscure small masses.
FINDINGS: There are no findings suspicious for malignancy. Images were
processed with CAD.
IMPRESSION: No mammographic evidence of malignancy. A result letter of this
screening mammogram will be mailed directly to the patient.

RECOMMENDATION:
Screening mammogram in one year. (Code:[0J])

BI-RADS CATEGORY  1: Negative.

## 2017-04-08 ENCOUNTER — Other Ambulatory Visit: Payer: Self-pay

## 2018-05-25 ENCOUNTER — Other Ambulatory Visit: Payer: Self-pay | Admitting: Family Medicine

## 2018-05-25 DIAGNOSIS — Z1231 Encounter for screening mammogram for malignant neoplasm of breast: Secondary | ICD-10-CM

## 2018-06-07 ENCOUNTER — Ambulatory Visit
Admission: RE | Admit: 2018-06-07 | Discharge: 2018-06-07 | Disposition: A | Discharge status: Home / Self Care | Payer: Medicare Other | Source: Ambulatory Visit | Attending: Family Medicine | Admitting: Family Medicine

## 2018-06-07 DIAGNOSIS — Z1231 Encounter for screening mammogram for malignant neoplasm of breast: Secondary | ICD-10-CM | POA: Diagnosis present

## 2018-06-07 IMAGING — MG DIGITAL SCREENING BILATERAL MAMMOGRAM WITH TOMO AND CAD
8 series · 9 of 24 positions shown · non-contrast
Comparison: Previous exam(s).

CLINICAL DATA: Screening.

EXAM:
DIGITAL SCREENING BILATERAL MAMMOGRAM WITH TOMO AND CAD

[R CC synth-2D]
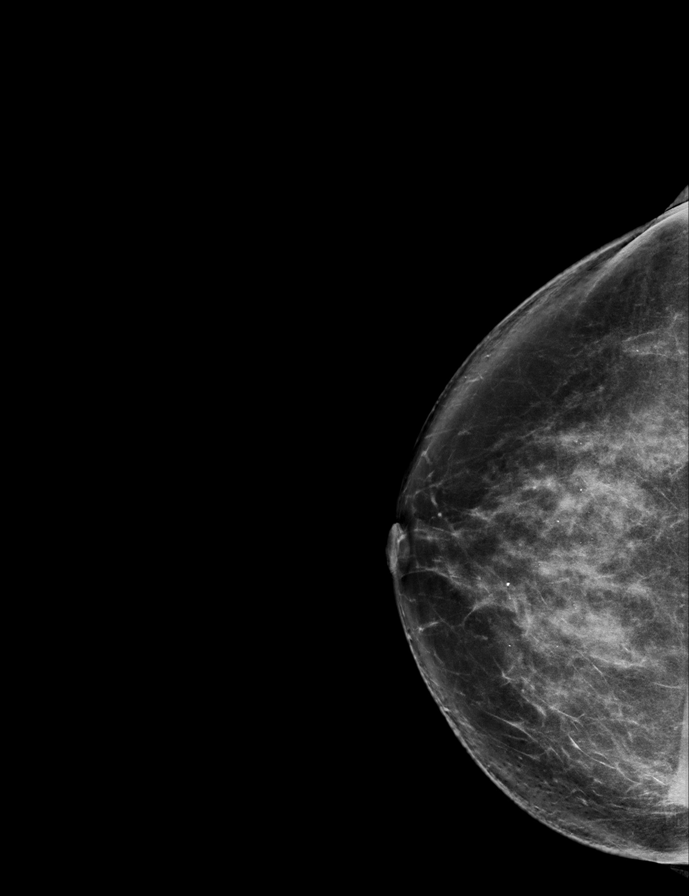

[R MLO synth-2D]
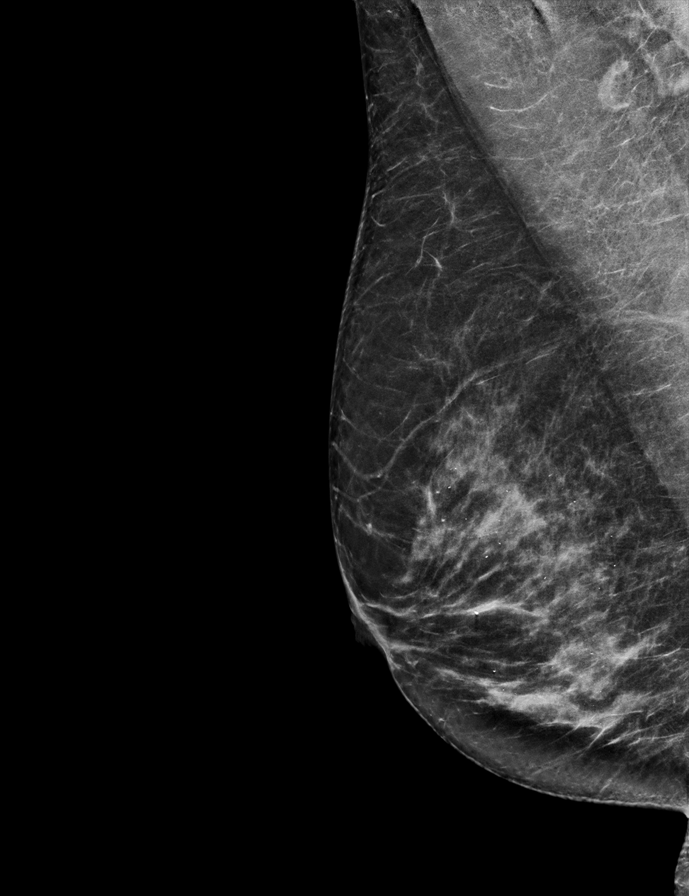

[L MLO synth-2D]
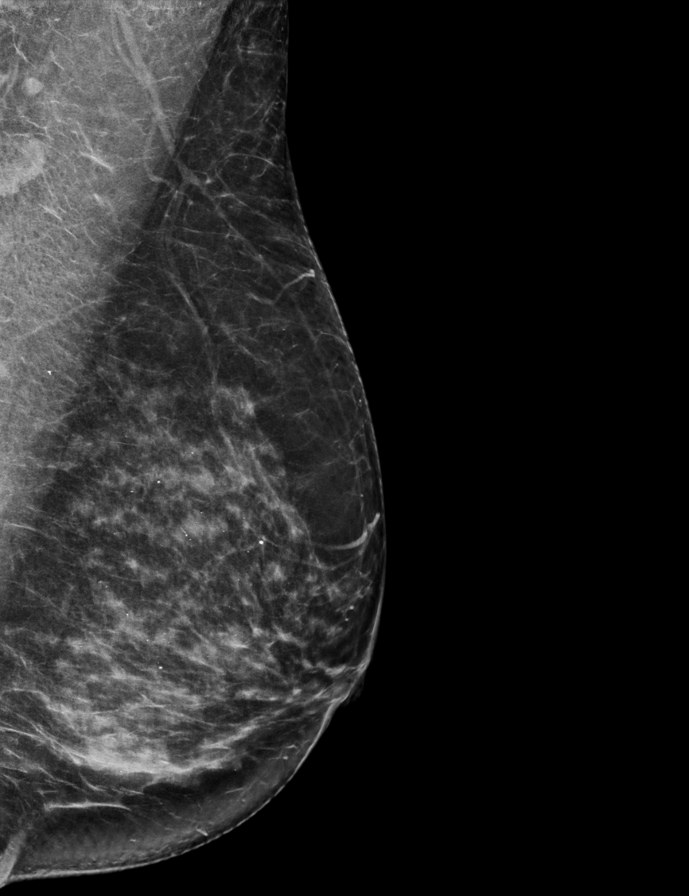

[L CC synth-2D]
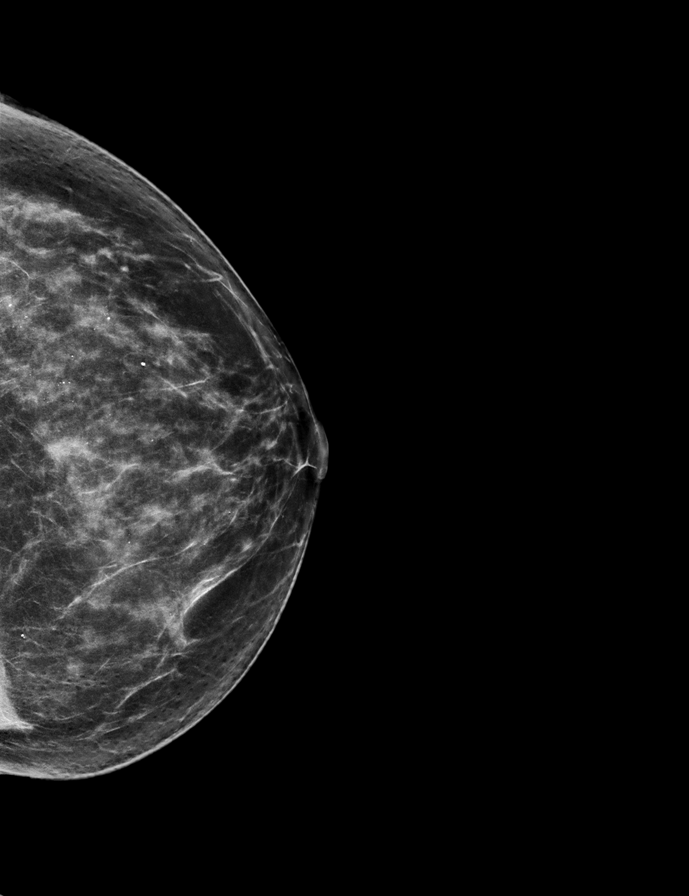

[R MLO tomo · 2 of 63 frames shown]
[frame 21/63]
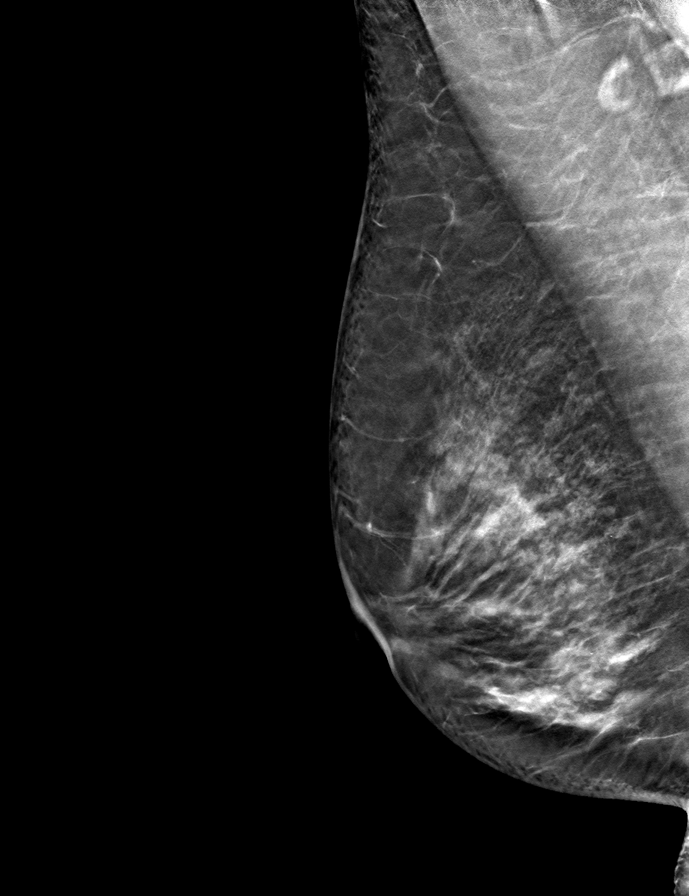
[frame 32/63]
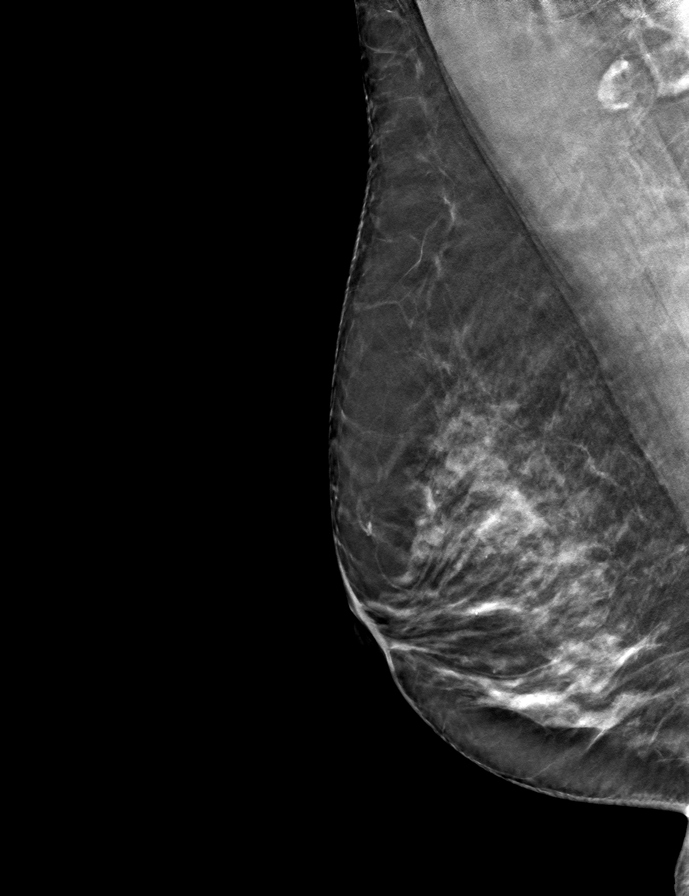

[L MLO tomo · tomo slice 33/65.0]
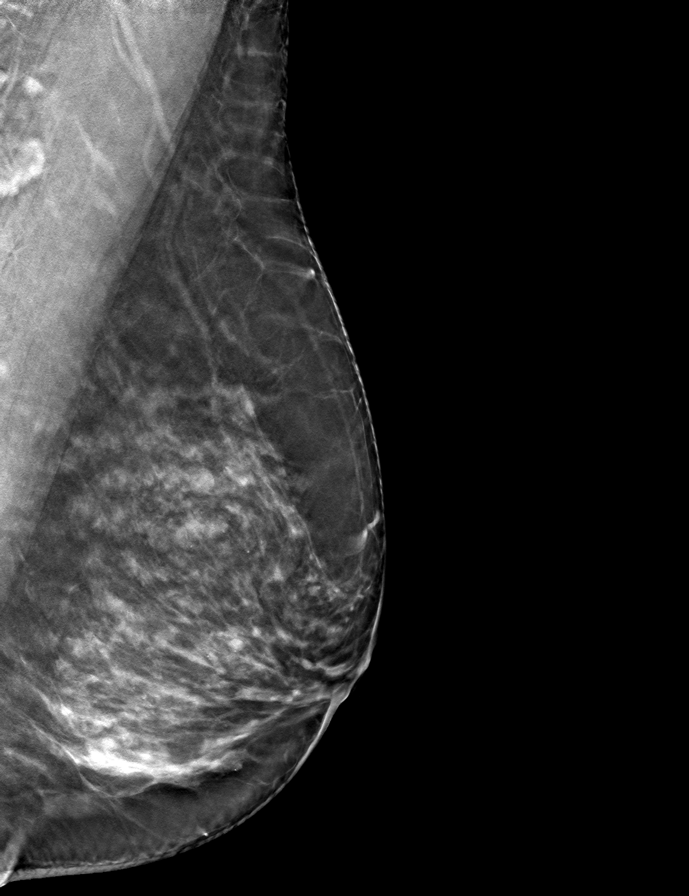

[L CC tomo · tomo slice 35/70.0]
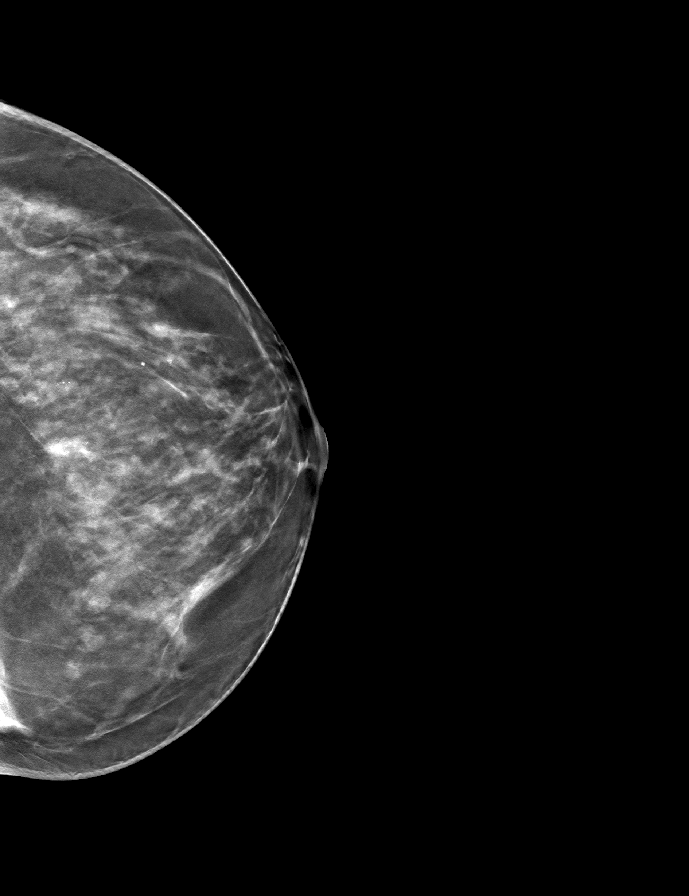

[R CC tomo · tomo slice 41/80.0]
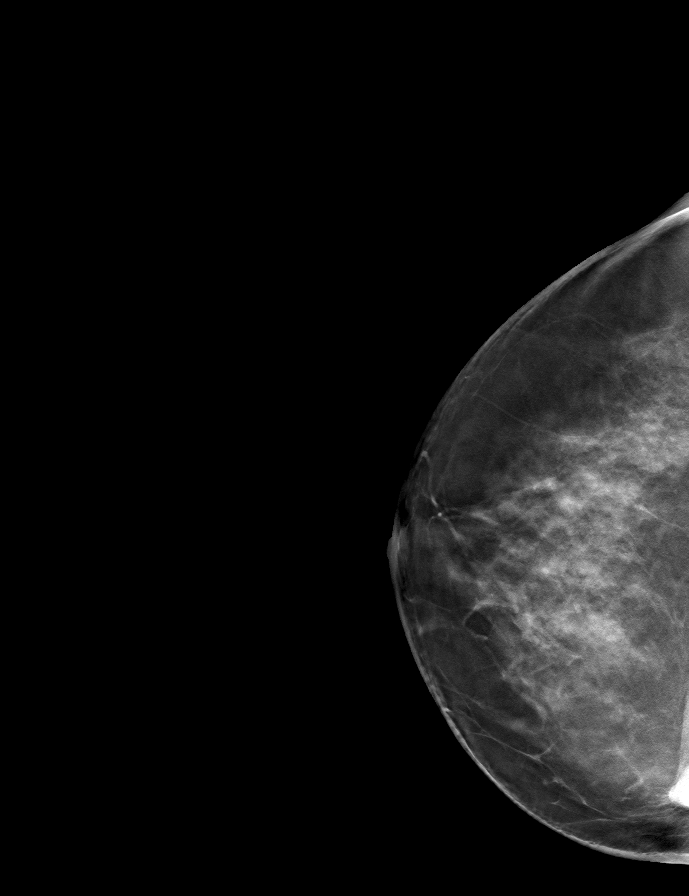

[9 of 24 positions shown; findings below may reference images not displayed]

ACR Breast Density Category c: The breast tissue is heterogeneously
dense, which may obscure small masses.
FINDINGS: There are no findings suspicious for malignancy. Images were
processed with CAD.
IMPRESSION: No mammographic evidence of malignancy. A result letter of this
screening mammogram will be mailed directly to the patient.

RECOMMENDATION:
Screening mammogram in one year. (Code:[5V])

BI-RADS CATEGORY  1: Negative.

## 2018-12-20 ENCOUNTER — Other Ambulatory Visit: Payer: Self-pay

## 2018-12-20 ENCOUNTER — Encounter: Payer: Self-pay | Admitting: *Deleted

## 2018-12-21 NOTE — Discharge Instructions (Signed)

## 2018-12-22 ENCOUNTER — Other Ambulatory Visit
Admission: RE | Admit: 2018-12-22 | Discharge: 2018-12-22 | Disposition: A | Discharge status: Home / Self Care | Payer: Medicare Other | Source: Ambulatory Visit | Attending: Ophthalmology | Admitting: Ophthalmology

## 2018-12-22 ENCOUNTER — Other Ambulatory Visit: Payer: Self-pay

## 2018-12-22 DIAGNOSIS — Z01812 Encounter for preprocedural laboratory examination: Secondary | ICD-10-CM | POA: Insufficient documentation

## 2018-12-22 DIAGNOSIS — Z1159 Encounter for screening for other viral diseases: Secondary | ICD-10-CM | POA: Diagnosis not present

## 2018-12-23 LAB — NOVEL CORONAVIRUS, NAA (HOSP ORDER, SEND-OUT TO REF LAB; TAT 18-24 HRS): SARS-CoV-2, NAA: NOT DETECTED

## 2018-12-26 ENCOUNTER — Ambulatory Visit: Payer: Medicare Other | Admitting: Anesthesiology

## 2018-12-26 ENCOUNTER — Encounter
Admission: RE | Disposition: A | Discharge status: Home / Self Care | Payer: Self-pay | Source: Home / Self Care | Attending: Ophthalmology

## 2018-12-26 ENCOUNTER — Other Ambulatory Visit: Payer: Self-pay

## 2018-12-26 ENCOUNTER — Ambulatory Visit
Admission: RE | Admit: 2018-12-26 | Discharge: 2018-12-26 | Disposition: A | Discharge status: Home / Self Care | Payer: Medicare Other | Attending: Ophthalmology | Admitting: Ophthalmology

## 2018-12-26 DIAGNOSIS — Z79899 Other long term (current) drug therapy: Secondary | ICD-10-CM | POA: Insufficient documentation

## 2018-12-26 DIAGNOSIS — E7800 Pure hypercholesterolemia, unspecified: Secondary | ICD-10-CM | POA: Insufficient documentation

## 2018-12-26 DIAGNOSIS — H2512 Age-related nuclear cataract, left eye: Secondary | ICD-10-CM | POA: Insufficient documentation

## 2018-12-26 DIAGNOSIS — F329 Major depressive disorder, single episode, unspecified: Secondary | ICD-10-CM | POA: Insufficient documentation

## 2018-12-26 DIAGNOSIS — E039 Hypothyroidism, unspecified: Secondary | ICD-10-CM | POA: Diagnosis not present

## 2018-12-26 DIAGNOSIS — Z79890 Hormone replacement therapy (postmenopausal): Secondary | ICD-10-CM | POA: Insufficient documentation

## 2018-12-26 DIAGNOSIS — Z85828 Personal history of other malignant neoplasm of skin: Secondary | ICD-10-CM | POA: Diagnosis not present

## 2018-12-26 HISTORY — DX: Unspecified osteoarthritis, unspecified site: M19.90

## 2018-12-26 HISTORY — DX: Hypothyroidism, unspecified: E03.9

## 2018-12-26 HISTORY — PX: CATARACT EXTRACTION W/PHACO: SHX586

## 2018-12-26 HISTORY — DX: Hyperlipidemia, unspecified: E78.5

## 2018-12-26 HISTORY — DX: Gastro-esophageal reflux disease without esophagitis: K21.9

## 2018-12-26 SURGERY — PHACOEMULSIFICATION, CATARACT, WITH IOL INSERTION
Anesthesia: Monitor Anesthesia Care | Site: Eye | Laterality: Left

## 2018-12-26 MED ORDER — ARMC OPHTHALMIC DILATING DROPS
1.0000 "application " | OPHTHALMIC | Status: DC | PRN
  Administered 2018-12-26 (×3): 1 via OPHTHALMIC

## 2018-12-26 MED ORDER — TETRACAINE HCL 0.5 % OP SOLN
1.0000 [drp] | OPHTHALMIC | Status: DC | PRN
  Administered 2018-12-26 (×3): 1 [drp] via OPHTHALMIC

## 2018-12-26 MED ORDER — OXYCODONE HCL 5 MG PO TABS: 5.0000 mg | ORAL_TABLET | Freq: Once | ORAL | Status: DC | PRN

## 2018-12-26 MED ORDER — NA CHONDROIT SULF-NA HYALURON 40-17 MG/ML IO SOLN
INTRAOCULAR | Status: DC | PRN
  Administered 2018-12-26: 1 mL via INTRAOCULAR

## 2018-12-26 MED ORDER — MEPERIDINE HCL 25 MG/ML IJ SOLN: 6.2500 mg | INTRAMUSCULAR | Status: DC | PRN

## 2018-12-26 MED ORDER — MIDAZOLAM HCL 2 MG/2ML IJ SOLN
INTRAMUSCULAR | Status: DC | PRN
  Administered 2018-12-26: 2 mg via INTRAVENOUS

## 2018-12-26 MED ORDER — EPINEPHRINE PF 1 MG/ML IJ SOLN
INTRAOCULAR | Status: DC | PRN
  Administered 2018-12-26: 56 mL via OPHTHALMIC

## 2018-12-26 MED ORDER — MOXIFLOXACIN HCL 0.5 % OP SOLN
OPHTHALMIC | Status: DC | PRN
  Administered 2018-12-26: 0.2 mL via OPHTHALMIC

## 2018-12-26 MED ORDER — PROMETHAZINE HCL 25 MG/ML IJ SOLN: 6.2500 mg | INTRAMUSCULAR | Status: DC | PRN

## 2018-12-26 MED ORDER — BRIMONIDINE TARTRATE-TIMOLOL 0.2-0.5 % OP SOLN
OPHTHALMIC | Status: DC | PRN
  Administered 2018-12-26: 1 [drp] via OPHTHALMIC

## 2018-12-26 MED ORDER — OXYCODONE HCL 5 MG/5ML PO SOLN: 5.0000 mg | Freq: Once | ORAL | Status: DC | PRN

## 2018-12-26 MED ORDER — FENTANYL CITRATE (PF) 100 MCG/2ML IJ SOLN
INTRAMUSCULAR | Status: DC | PRN
  Administered 2018-12-26: 50 ug via INTRAVENOUS

## 2018-12-26 MED ORDER — LIDOCAINE HCL (PF) 2 % IJ SOLN
INTRAOCULAR | Status: DC | PRN
  Administered 2018-12-26: 2 mL

## 2018-12-26 MED ORDER — LACTATED RINGERS IV SOLN: 10.0000 mL/h | INTRAVENOUS | Status: DC

## 2018-12-26 MED ORDER — FENTANYL CITRATE (PF) 100 MCG/2ML IJ SOLN: 25.0000 ug | INTRAMUSCULAR | Status: DC | PRN

## 2018-12-26 SURGICAL SUPPLY — 19 items
CANNULA ANT/CHMB 27GA (MISCELLANEOUS) ×3 IMPLANT
GLOVE SURG LX 8.0 MICRO (GLOVE) ×2
GLOVE SURG LX STRL 8.0 MICRO (GLOVE) ×1 IMPLANT
GLOVE SURG TRIUMPH 8.0 PF LTX (GLOVE) ×3 IMPLANT
GOWN STRL REUS W/ TWL LRG LVL3 (GOWN DISPOSABLE) ×2 IMPLANT
GOWN STRL REUS W/TWL LRG LVL3 (GOWN DISPOSABLE) ×4
LENS IOL TECNIS ITEC 20.0 (Intraocular Lens) ×3 IMPLANT
MARKER SKIN DUAL TIP RULER LAB (MISCELLANEOUS) ×3 IMPLANT
NDL RETROBULBAR .5 NSTRL (NEEDLE) ×3 IMPLANT
NEEDLE FILTER BLUNT 18X 1/2SAF (NEEDLE) ×2
NEEDLE FILTER BLUNT 18X1 1/2 (NEEDLE) ×1 IMPLANT
PACK EYE AFTER SURG (MISCELLANEOUS) ×3 IMPLANT
PACK OPTHALMIC (MISCELLANEOUS) ×3 IMPLANT
PACK PORFILIO (MISCELLANEOUS) ×3 IMPLANT
SUT ETHILON 10-0 CS-B-6CS-B-6 (SUTURE)
SUTURE EHLN 10-0 CS-B-6CS-B-6 (SUTURE) IMPLANT
SYR 3ML LL SCALE MARK (SYRINGE) ×3 IMPLANT
SYR TB 1ML LUER SLIP (SYRINGE) ×3 IMPLANT
WIPE NON LINTING 3.25X3.25 (MISCELLANEOUS) ×3 IMPLANT

## 2018-12-26 NOTE — H&P (Signed)
All labs reviewed. Abnormal studies sent to patients PCP when indicated.  Previous H&P reviewed, patient examined, there are NO CHANGES.  Sonia Gin Porfilio6/9/20208:27 AM

## 2018-12-26 NOTE — Anesthesia Procedure Notes (Signed)
Procedure Name: MAC Date/Time: 12/26/2018 8:32 AM Performed by: Janna Arch, CRNA Pre-anesthesia Checklist: Patient identified, Emergency Drugs available, Suction available, Timeout performed and Patient being monitored Patient Re-evaluated:Patient Re-evaluated prior to induction Oxygen Delivery Method: Nasal cannula Placement Confirmation: positive ETCO2

## 2018-12-26 NOTE — Op Note (Signed)
PREOPERATIVE DIAGNOSIS:  Nuclear sclerotic cataract of the left eye.   POSTOPERATIVE DIAGNOSIS:  Nuclear sclerotic cataract of the left eye.   OPERATIVE PROCEDURE: Procedure(s): CATARACT EXTRACTION PHACO AND INTRAOCULAR LENS PLACEMENT (IOC) LEFT   SURGEON:  Birder Robson, MD.   ANESTHESIA:  Anesthesiologist: Marice Potter, MD CRNA: Janna Arch, CRNA  1.      Managed anesthesia care. 2.     0.20ml of Shugarcaine was instilled following the paracentesis   COMPLICATIONS:  None.   TECHNIQUE:   Stop and chop   DESCRIPTION OF PROCEDURE:  The patient was examined and consented in the preoperative holding area where the aforementioned topical anesthesia was applied to the left eye and then brought back to the Operating Room where the left eye was prepped and draped in the usual sterile ophthalmic fashion and a lid speculum was placed. A paracentesis was created with the side port blade and the anterior chamber was filled with viscoelastic. A near clear corneal incision was performed with the steel keratome. A continuous curvilinear capsulorrhexis was performed with a cystotome followed by the capsulorrhexis forceps. Hydrodissection and hydrodelineation were carried out with BSS on a blunt cannula. The lens was removed in a stop and chop  technique and the remaining cortical material was removed with the irrigation-aspiration handpiece. The capsular bag was inflated with viscoelastic and the Technis [id number] lens was placed in the capsular bag without complication. The remaining viscoelastic was removed from the eye with the irrigation-aspiration handpiece. The wounds were hydrated. The anterior chamber was flushed with BSS and the eye was inflated to physiologic pressure. 0.3ml Vigamox was placed in the anterior chamber. The wounds were found to be water tight. The eye was dressed with Combigan. The patient was given protective glasses to wear throughout the day and a shield with which to  sleep tonight. The patient was also given drops with which to begin a drop regimen today and will follow-up with me in one day. Implant Name Type Inv. Item Serial No. Manufacturer Lot No. LRB No. Used  LENS IOL DIOP 20.0 - [id number] Intraocular Lens LENS IOL DIOP 20.0 [id number] AMO  Left 1    Procedure(s): CATARACT EXTRACTION PHACO AND INTRAOCULAR LENS PLACEMENT (IOC) LEFT (Left)  Electronically signed: Birder Robson 12/26/2018 8:52 AM

## 2018-12-26 NOTE — Anesthesia Preprocedure Evaluation (Signed)
Anesthesia Evaluation  Patient identified by MRN, date of birth, ID band Patient awake    Reviewed: Allergy & Precautions, H&P , NPO status , Patient's Chart, lab work & pertinent test results, reviewed documented beta blocker date and time   Airway Mallampati: II  TM Distance: >3 FB Neck ROM: full    Dental no notable dental hx.    Pulmonary neg pulmonary ROS,    Pulmonary exam normal breath sounds clear to auscultation       Cardiovascular Exercise Tolerance: Good negative cardio ROS   Rhythm:regular Rate:Normal     Neuro/Psych negative neurological ROS  negative psych ROS   GI/Hepatic Neg liver ROS, GERD  ,  Endo/Other  Hypothyroidism   Renal/GU negative Renal ROS  negative genitourinary   Musculoskeletal   Abdominal   Peds  Hematology negative hematology ROS (+)   Anesthesia Other Findings   Reproductive/Obstetrics negative OB ROS                             Anesthesia Physical Anesthesia Plan  ASA: II  Anesthesia Plan: MAC   Post-op Pain Management:    Induction:   PONV Risk Score and Plan:   Airway Management Planned:   Additional Equipment:   Intra-op Plan:   Post-operative Plan:   Informed Consent: I have reviewed the patients History and Physical, chart, labs and discussed the procedure including the risks, benefits and alternatives for the proposed anesthesia with the patient or authorized representative who has indicated his/her understanding and acceptance.     Dental Advisory Given  Plan Discussed with: CRNA  Anesthesia Plan Comments:         Anesthesia Quick Evaluation

## 2018-12-26 NOTE — Transfer of Care (Signed)
Immediate Anesthesia Transfer of Care Note  Patient: Sonia Farrell  Procedure(s) Performed: CATARACT EXTRACTION PHACO AND INTRAOCULAR LENS PLACEMENT (IOC) LEFT (Left Eye)  Patient Location: PACU  Anesthesia Type: MAC  Level of Consciousness: awake, alert  and patient cooperative  Airway and Oxygen Therapy: Patient Spontanous Breathing and Patient connected to supplemental oxygen  Post-op Assessment: Post-op Vital signs reviewed, Patient's Cardiovascular Status Stable, Respiratory Function Stable, Patent Airway and No signs of Nausea or vomiting  Post-op Vital Signs: Reviewed and stable  Complications: No apparent anesthesia complications

## 2018-12-26 NOTE — Anesthesia Postprocedure Evaluation (Signed)
Anesthesia Post Note  Patient: Sonia Farrell  Procedure(s) Performed: CATARACT EXTRACTION PHACO AND INTRAOCULAR LENS PLACEMENT (IOC) LEFT (Left Eye)  Patient location during evaluation: PACU Anesthesia Type: MAC Level of consciousness: awake and alert Pain management: pain level controlled Vital Signs Assessment: post-procedure vital signs reviewed and stable Respiratory status: spontaneous breathing, nonlabored ventilation, respiratory function stable and patient connected to nasal cannula oxygen Cardiovascular status: stable and blood pressure returned to baseline Postop Assessment: no apparent nausea or vomiting Anesthetic complications: no    SCOURAS, NICOLE ELAINE

## 2018-12-27 ENCOUNTER — Encounter: Payer: Self-pay | Admitting: Ophthalmology

## 2019-01-19 ENCOUNTER — Other Ambulatory Visit: Payer: Self-pay

## 2019-01-19 ENCOUNTER — Other Ambulatory Visit
Admission: RE | Admit: 2019-01-19 | Discharge: 2019-01-19 | Disposition: A | Discharge status: Home / Self Care | Payer: Medicare Other | Source: Ambulatory Visit | Attending: Ophthalmology | Admitting: Ophthalmology

## 2019-01-19 DIAGNOSIS — Z01812 Encounter for preprocedural laboratory examination: Secondary | ICD-10-CM | POA: Diagnosis present

## 2019-01-19 DIAGNOSIS — Z1159 Encounter for screening for other viral diseases: Secondary | ICD-10-CM | POA: Insufficient documentation

## 2019-01-19 LAB — SARS CORONAVIRUS 2 (TAT 6-24 HRS): SARS Coronavirus 2: NEGATIVE

## 2019-01-22 ENCOUNTER — Encounter: Payer: Self-pay | Admitting: *Deleted

## 2019-01-22 ENCOUNTER — Other Ambulatory Visit: Payer: Self-pay

## 2019-01-22 NOTE — Discharge Instructions (Signed)

## 2019-01-23 ENCOUNTER — Ambulatory Visit: Payer: Medicare Other | Admitting: Anesthesiology

## 2019-01-23 ENCOUNTER — Ambulatory Visit
Admission: RE | Admit: 2019-01-23 | Discharge: 2019-01-23 | Disposition: A | Discharge status: Home / Self Care | Payer: Medicare Other | Source: Ambulatory Visit | Attending: Ophthalmology | Admitting: Ophthalmology

## 2019-01-23 ENCOUNTER — Encounter
Admission: RE | Disposition: A | Discharge status: Home / Self Care | Payer: Self-pay | Source: Ambulatory Visit | Attending: Ophthalmology

## 2019-01-23 DIAGNOSIS — H2511 Age-related nuclear cataract, right eye: Secondary | ICD-10-CM | POA: Diagnosis not present

## 2019-01-23 DIAGNOSIS — E7800 Pure hypercholesterolemia, unspecified: Secondary | ICD-10-CM | POA: Diagnosis not present

## 2019-01-23 DIAGNOSIS — F329 Major depressive disorder, single episode, unspecified: Secondary | ICD-10-CM | POA: Diagnosis not present

## 2019-01-23 DIAGNOSIS — Z79890 Hormone replacement therapy (postmenopausal): Secondary | ICD-10-CM | POA: Insufficient documentation

## 2019-01-23 DIAGNOSIS — R011 Cardiac murmur, unspecified: Secondary | ICD-10-CM | POA: Diagnosis not present

## 2019-01-23 DIAGNOSIS — M1990 Unspecified osteoarthritis, unspecified site: Secondary | ICD-10-CM | POA: Insufficient documentation

## 2019-01-23 DIAGNOSIS — E039 Hypothyroidism, unspecified: Secondary | ICD-10-CM | POA: Diagnosis not present

## 2019-01-23 DIAGNOSIS — Z79899 Other long term (current) drug therapy: Secondary | ICD-10-CM | POA: Insufficient documentation

## 2019-01-23 DIAGNOSIS — K219 Gastro-esophageal reflux disease without esophagitis: Secondary | ICD-10-CM | POA: Diagnosis not present

## 2019-01-23 DIAGNOSIS — E785 Hyperlipidemia, unspecified: Secondary | ICD-10-CM | POA: Insufficient documentation

## 2019-01-23 HISTORY — PX: CATARACT EXTRACTION W/PHACO: SHX586

## 2019-01-23 SURGERY — PHACOEMULSIFICATION, CATARACT, WITH IOL INSERTION
Anesthesia: Monitor Anesthesia Care | Site: Eye | Laterality: Right

## 2019-01-23 MED ORDER — BRIMONIDINE TARTRATE-TIMOLOL 0.2-0.5 % OP SOLN
OPHTHALMIC | Status: DC | PRN
  Administered 2019-01-23: 1 [drp] via OPHTHALMIC

## 2019-01-23 MED ORDER — OXYCODONE HCL 5 MG PO TABS: 5.0000 mg | ORAL_TABLET | Freq: Once | ORAL | Status: DC | PRN

## 2019-01-23 MED ORDER — ARMC OPHTHALMIC DILATING DROPS
1.0000 "application " | OPHTHALMIC | Status: DC | PRN
  Administered 2019-01-23 (×3): 1 via OPHTHALMIC

## 2019-01-23 MED ORDER — NA CHONDROIT SULF-NA HYALURON 40-17 MG/ML IO SOLN
INTRAOCULAR | Status: DC | PRN
  Administered 2019-01-23: 1 mL via INTRAOCULAR

## 2019-01-23 MED ORDER — ONDANSETRON HCL 4 MG/2ML IJ SOLN
INTRAMUSCULAR | Status: DC | PRN
  Administered 2019-01-23: 4 mg via INTRAVENOUS

## 2019-01-23 MED ORDER — LIDOCAINE HCL (PF) 2 % IJ SOLN
INTRAOCULAR | Status: DC | PRN
  Administered 2019-01-23: 09:00:00 2 mL

## 2019-01-23 MED ORDER — TETRACAINE HCL 0.5 % OP SOLN
1.0000 [drp] | OPHTHALMIC | Status: DC | PRN
  Administered 2019-01-23 (×3): 1 [drp] via OPHTHALMIC

## 2019-01-23 MED ORDER — MIDAZOLAM HCL 2 MG/2ML IJ SOLN
INTRAMUSCULAR | Status: DC | PRN
  Administered 2019-01-23: 2 mg via INTRAVENOUS

## 2019-01-23 MED ORDER — OXYCODONE HCL 5 MG/5ML PO SOLN: 5.0000 mg | Freq: Once | ORAL | Status: DC | PRN

## 2019-01-23 MED ORDER — MOXIFLOXACIN HCL 0.5 % OP SOLN
OPHTHALMIC | Status: DC | PRN
  Administered 2019-01-23: 0.2 mL via OPHTHALMIC

## 2019-01-23 MED ORDER — LACTATED RINGERS IV SOLN: INTRAVENOUS | Status: DC

## 2019-01-23 MED ORDER — EPINEPHRINE PF 1 MG/ML IJ SOLN
INTRAOCULAR | Status: DC | PRN
  Administered 2019-01-23: 09:00:00 57 mL via OPHTHALMIC

## 2019-01-23 MED ORDER — FENTANYL CITRATE (PF) 100 MCG/2ML IJ SOLN
25.0000 ug | INTRAMUSCULAR | Status: DC | PRN
  Administered 2019-01-23 (×2): 50 ug via INTRAVENOUS

## 2019-01-23 SURGICAL SUPPLY — 19 items

## 2019-01-23 NOTE — H&P (Signed)
All labs reviewed. Abnormal studies sent to patients PCP when indicated.  Previous H&P reviewed, patient examined, there are NO CHANGES.  Sonia Farrell Porfilio7/7/20208:20 AM

## 2019-01-23 NOTE — Anesthesia Postprocedure Evaluation (Signed)
Anesthesia Post Note  Patient: Sonia Farrell  Procedure(s) Performed: CATARACT EXTRACTION PHACO AND INTRAOCULAR LENS PLACEMENT (IOC) RIGHT (Right Eye)  Patient location during evaluation: PACU Anesthesia Type: MAC Level of consciousness: awake and alert Pain management: pain level controlled Vital Signs Assessment: post-procedure vital signs reviewed and stable Respiratory status: spontaneous breathing, nonlabored ventilation, respiratory function stable and patient connected to nasal cannula oxygen Cardiovascular status: stable and blood pressure returned to baseline Postop Assessment: no apparent nausea or vomiting Anesthetic complications: no    Stellan Vick C

## 2019-01-23 NOTE — Anesthesia Preprocedure Evaluation (Signed)
Anesthesia Evaluation  Patient identified by MRN, date of birth, ID band Patient awake    Reviewed: Allergy & Precautions, NPO status , Patient's Chart, lab work & pertinent test results  Airway Mallampati: II  TM Distance: >3 FB Neck ROM: Full    Dental no notable dental hx.    Pulmonary neg pulmonary ROS,    Pulmonary exam normal breath sounds clear to auscultation       Cardiovascular negative cardio ROS Normal cardiovascular exam Rhythm:Regular Rate:Normal  hyperlipidemia   Neuro/Psych negative neurological ROS  negative psych ROS   GI/Hepatic Neg liver ROS, GERD  Medicated,  Endo/Other  Hypothyroidism   Renal/GU negative Renal ROS  negative genitourinary   Musculoskeletal  (+) Arthritis ,   Abdominal   Peds negative pediatric ROS (+)  Hematology negative hematology ROS (+)   Anesthesia Other Findings   Reproductive/Obstetrics negative OB ROS                             Anesthesia Physical Anesthesia Plan  ASA: II  Anesthesia Plan: MAC   Post-op Pain Management:    Induction: Intravenous  PONV Risk Score and Plan:   Airway Management Planned:   Additional Equipment:   Intra-op Plan:   Post-operative Plan: Extubation in OR  Informed Consent: I have reviewed the patients History and Physical, chart, labs and discussed the procedure including the risks, benefits and alternatives for the proposed anesthesia with the patient or authorized representative who has indicated his/her understanding and acceptance.     Dental advisory given  Plan Discussed with: CRNA  Anesthesia Plan Comments:         Anesthesia Quick Evaluation

## 2019-01-23 NOTE — Op Note (Signed)
PREOPERATIVE DIAGNOSIS:  Nuclear sclerotic cataract of the right eye.   POSTOPERATIVE DIAGNOSIS:  H25.11  CATARACT   OPERATIVE PROCEDURE: Procedure(s): CATARACT EXTRACTION PHACO AND INTRAOCULAR LENS PLACEMENT (IOC) RIGHT   SURGEON:  Birder Robson, MD.   ANESTHESIA:  Anesthesiologist: Shyrl Numbers, MD CRNA: Jeannene Patella, CRNA  1.      Managed anesthesia care. 2.      0.63ml of Shugarcaine was instilled in the eye following the paracentesis.   COMPLICATIONS:  None.   TECHNIQUE:   Stop and chop   DESCRIPTION OF PROCEDURE:  The patient was examined and consented in the preoperative holding area where the aforementioned topical anesthesia was applied to the right eye and then brought back to the Operating Room where the right eye was prepped and draped in the usual sterile ophthalmic fashion and a lid speculum was placed. A paracentesis was created with the side port blade and the anterior chamber was filled with viscoelastic. A near clear corneal incision was performed with the steel keratome. A continuous curvilinear capsulorrhexis was performed with a cystotome followed by the capsulorrhexis forceps. Hydrodissection and hydrodelineation were carried out with BSS on a blunt cannula. The lens was removed in a stop and chop  technique and the remaining cortical material was removed with the irrigation-aspiration handpiece. The capsular bag was inflated with viscoelastic and the Technis [id number]  lens was placed in the capsular bag without complication. The remaining viscoelastic was removed from the eye with the irrigation-aspiration handpiece. The wounds were hydrated. The anterior chamber was flushed with BSS and the eye was inflated to physiologic pressure. 0.39ml of Vigamox was placed in the anterior chamber. The wounds were found to be water tight. The eye was dressed with [place]. The patient was given protective glasses to wear throughout the day and a shield with which to  sleep tonight. The patient was also given drops with which to begin a drop regimen today and will follow-up with me in one day. Implant Name Type Inv. Item Serial No. Manufacturer Lot No. LRB No. Used Action  LENS IOL DIOP 20.0 - [id number] Intraocular Lens LENS IOL DIOP 20.0 [id number] AMO  Right 1 Implanted   Procedure(s): CATARACT EXTRACTION PHACO AND INTRAOCULAR LENS PLACEMENT (IOC) RIGHT (Right)  Electronically signed: Birder Robson 01/23/2019 8:45 AM

## 2019-01-23 NOTE — Transfer of Care (Signed)
Immediate Anesthesia Transfer of Care Note  Patient: Sonia Farrell  Procedure(s) Performed: CATARACT EXTRACTION PHACO AND INTRAOCULAR LENS PLACEMENT (IOC) RIGHT (Right Eye)  Patient Location: PACU  Anesthesia Type: MAC  Level of Consciousness: awake, alert  and patient cooperative  Airway and Oxygen Therapy: Patient Spontanous Breathing and Patient connected to supplemental oxygen  Post-op Assessment: Post-op Vital signs reviewed, Patient's Cardiovascular Status Stable, Respiratory Function Stable, Patent Airway and No signs of Nausea or vomiting  Post-op Vital Signs: Reviewed and stable  Complications: No apparent anesthesia complications

## 2019-01-24 ENCOUNTER — Encounter: Payer: Self-pay | Admitting: Ophthalmology

## 2019-02-02 ENCOUNTER — Other Ambulatory Visit: Payer: Self-pay | Admitting: Ophthalmology

## 2019-02-02 DIAGNOSIS — G453 Amaurosis fugax: Secondary | ICD-10-CM

## 2019-02-08 ENCOUNTER — Ambulatory Visit
Admission: RE | Admit: 2019-02-08 | Discharge: 2019-02-08 | Disposition: A | Discharge status: Home / Self Care | Payer: Medicare Other | Source: Ambulatory Visit | Attending: Ophthalmology | Admitting: Ophthalmology

## 2019-02-08 ENCOUNTER — Other Ambulatory Visit: Payer: Self-pay

## 2019-02-08 DIAGNOSIS — G453 Amaurosis fugax: Secondary | ICD-10-CM | POA: Diagnosis not present

## 2019-02-08 IMAGING — US BILATERAL CAROTID DUPLEX ULTRASOUND
1 series · 13 of 24 positions shown · non-contrast
Comparison: None.

CLINICAL DATA: Amaurosis fugax

EXAM:
BILATERAL CAROTID DUPLEX ULTRASOUND
TECHNIQUE: Gray scale imaging, color Doppler and duplex ultrasound were
performed of bilateral carotid and vertebral arteries in the neck.

[Series 1: bilateral carotid duplex ultrasound · 13 of 64 slices shown]
[im 1/64]
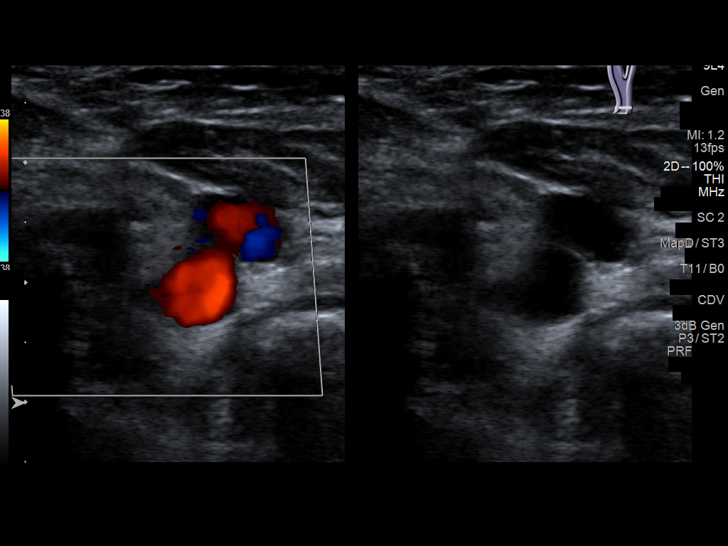
[im 6/64]
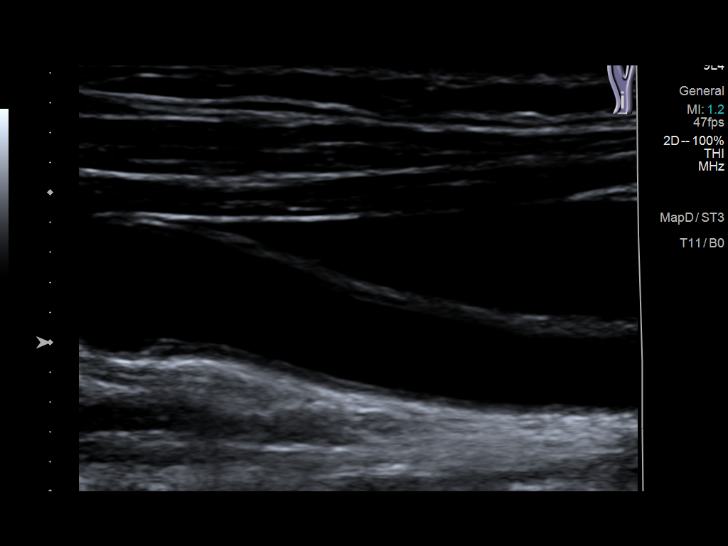
[im 11/64]
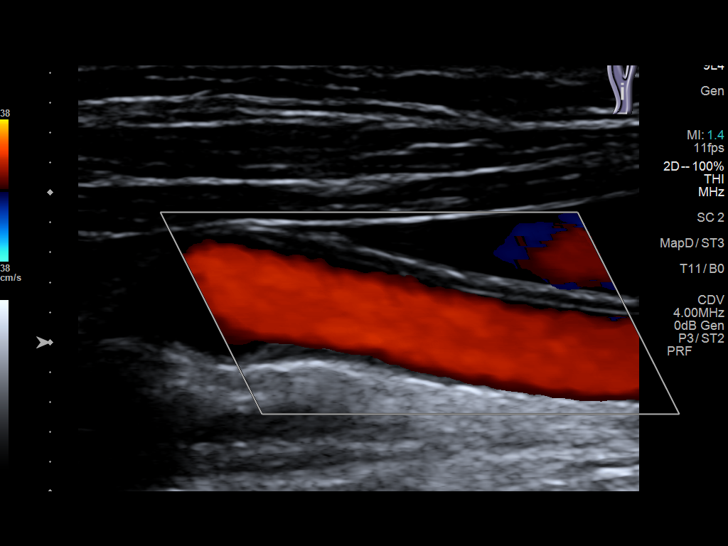
[im 17/64]
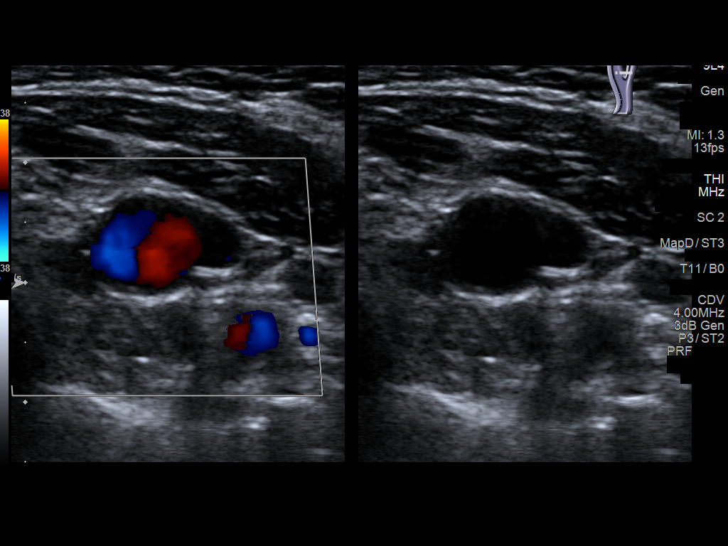
[im 22/64]
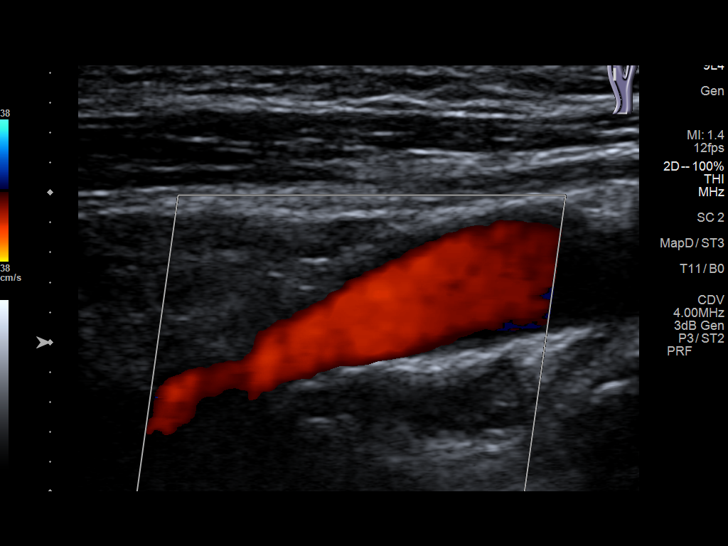
[im 28/64]
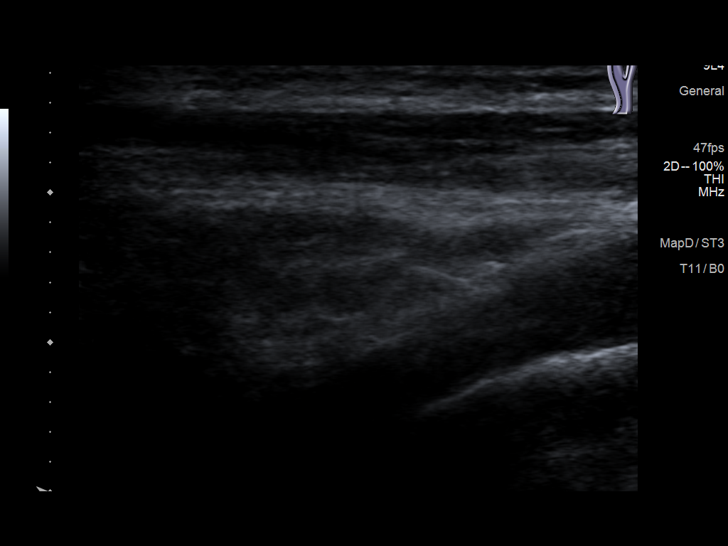
[im 33/64]
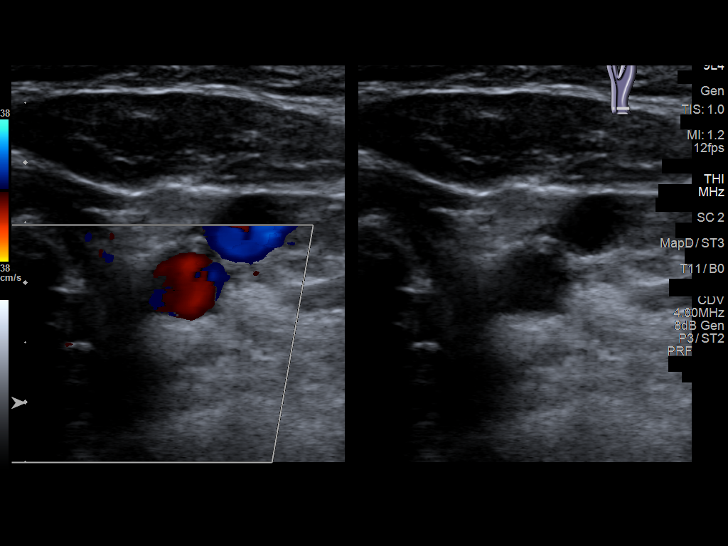
[im 36/64]
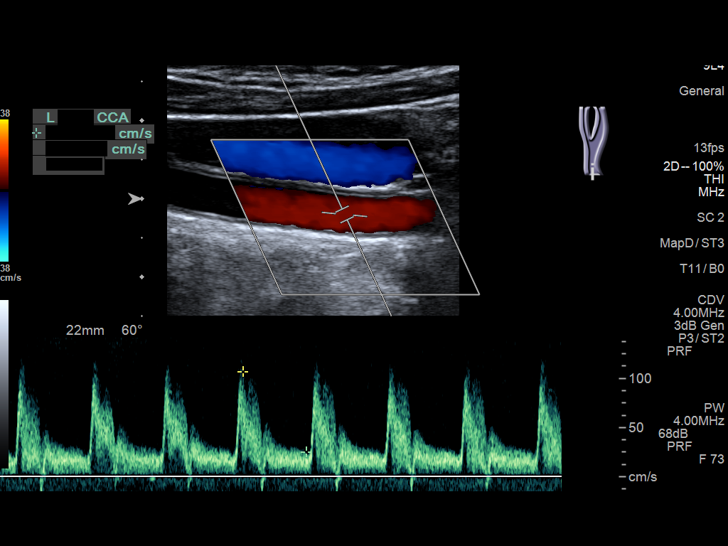
[im 42/64]
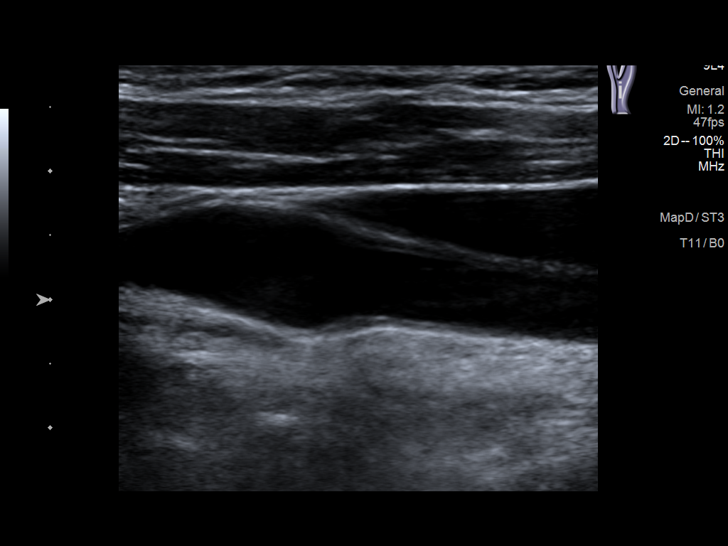
[im 47/64]
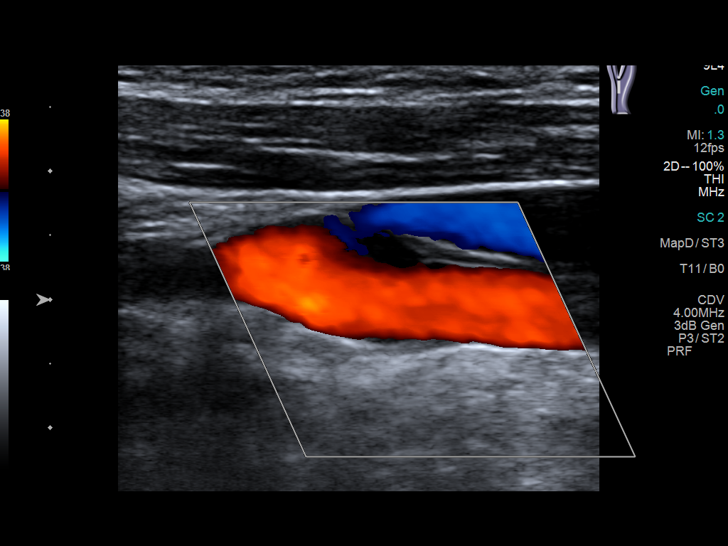
[im 53/64]
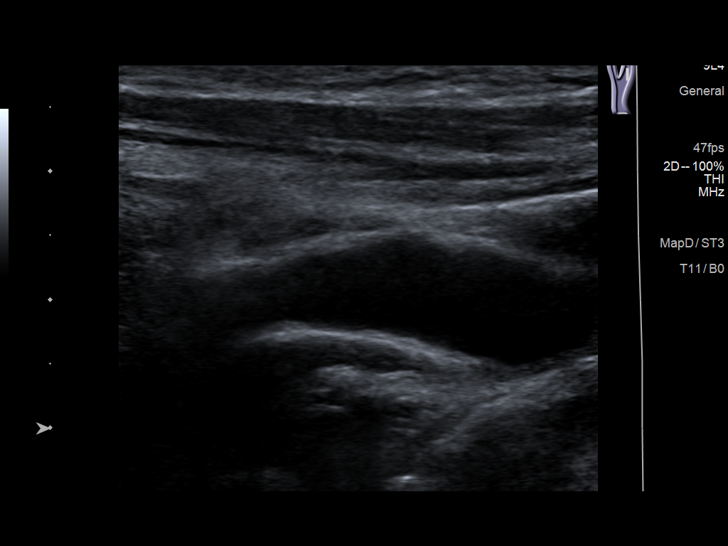
[im 58/64]
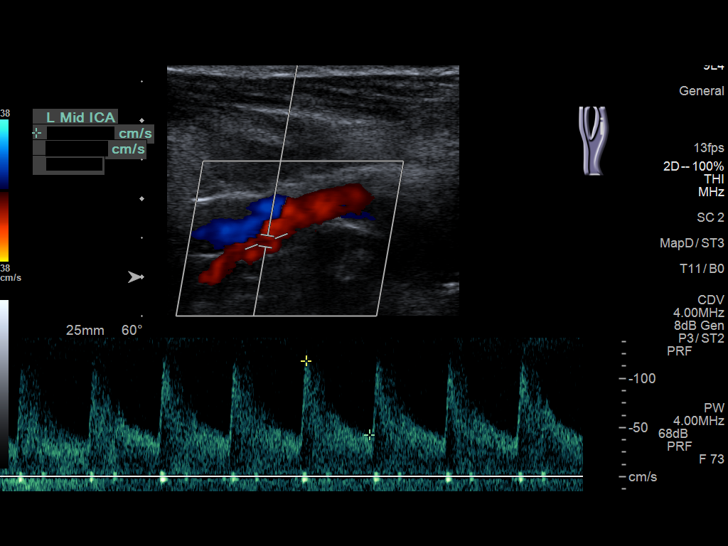
[im 64/64]
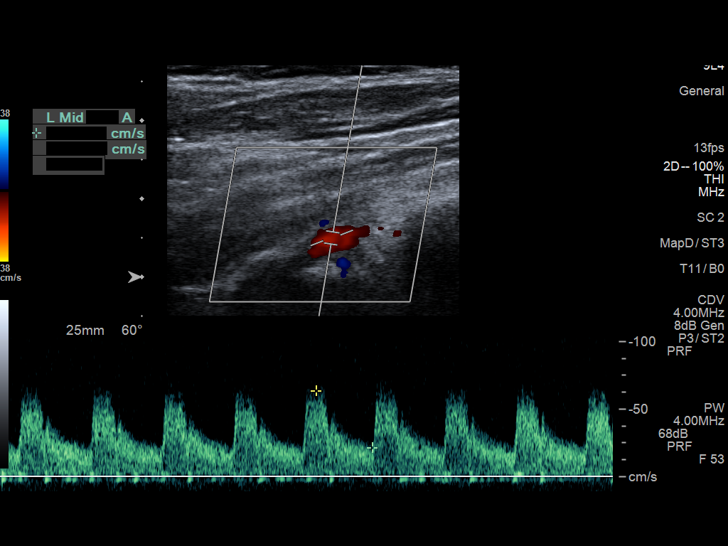

[13 of 24 positions shown; findings below may reference images not displayed]

FINDINGS: Criteria: Quantification of carotid stenosis is based on velocity
parameters that correlate the residual internal carotid diameter
with NASCET-based stenosis levels, using the diameter of the distal
internal carotid lumen as the denominator for stenosis measurement.

The following velocity measurements were obtained:

RIGHT

ICA: 118/39 cm/sec

CCA: 96/23 cm/sec

SYSTOLIC ICA/CCA RATIO:

ECA: 84 cm/sec

LEFT

ICA: 118/43 cm/sec

CCA: 87/16 cm/sec

SYSTOLIC ICA/CCA RATIO:

ECA: 80 cm/sec

RIGHT CAROTID ARTERY: Mild smooth plaque in the proximal ECA. No
high-grade stenosis. Normal waveforms and color Doppler signal.

RIGHT VERTEBRAL ARTERY:  Normal flow direction and waveform.

LEFT CAROTID ARTERY: No significant plaque accumulation or stenosis.
Normal waveforms and color Doppler signal.

LEFT VERTEBRAL ARTERY:  Normal flow direction and waveform.
IMPRESSION: 1. Mild proximal right ECA plaque resulting in less than 50%
diameter stenosis.
2.  Antegrade bilateral vertebral arterial flow.

## 2019-03-31 ENCOUNTER — Emergency Department: Payer: Medicare Other

## 2019-03-31 ENCOUNTER — Other Ambulatory Visit: Payer: Self-pay

## 2019-03-31 ENCOUNTER — Inpatient Hospital Stay
Admission: EM | Admit: 2019-03-31 | Discharge: 2019-04-19 | DRG: 917 | Disposition: E | Payer: Medicare Other | Attending: Internal Medicine | Admitting: Internal Medicine

## 2019-03-31 ENCOUNTER — Encounter: Payer: Self-pay | Admitting: Emergency Medicine

## 2019-03-31 DIAGNOSIS — Z515 Encounter for palliative care: Secondary | ICD-10-CM | POA: Diagnosis not present

## 2019-03-31 DIAGNOSIS — J181 Lobar pneumonia, unspecified organism: Secondary | ICD-10-CM

## 2019-03-31 DIAGNOSIS — G92 Toxic encephalopathy: Secondary | ICD-10-CM | POA: Diagnosis present

## 2019-03-31 DIAGNOSIS — M19042 Primary osteoarthritis, left hand: Secondary | ICD-10-CM | POA: Diagnosis present

## 2019-03-31 DIAGNOSIS — Z66 Do not resuscitate: Secondary | ICD-10-CM | POA: Diagnosis not present

## 2019-03-31 DIAGNOSIS — E039 Hypothyroidism, unspecified: Secondary | ICD-10-CM | POA: Diagnosis present

## 2019-03-31 DIAGNOSIS — T424X2A Poisoning by benzodiazepines, intentional self-harm, initial encounter: Secondary | ICD-10-CM | POA: Diagnosis present

## 2019-03-31 DIAGNOSIS — T17910D Gastric contents in respiratory tract, part unspecified causing asphyxiation, subsequent encounter: Secondary | ICD-10-CM | POA: Diagnosis not present

## 2019-03-31 DIAGNOSIS — F419 Anxiety disorder, unspecified: Secondary | ICD-10-CM | POA: Diagnosis present

## 2019-03-31 DIAGNOSIS — M19041 Primary osteoarthritis, right hand: Secondary | ICD-10-CM | POA: Diagnosis present

## 2019-03-31 DIAGNOSIS — M4712 Other spondylosis with myelopathy, cervical region: Secondary | ICD-10-CM | POA: Diagnosis present

## 2019-03-31 DIAGNOSIS — Z20828 Contact with and (suspected) exposure to other viral communicable diseases: Secondary | ICD-10-CM | POA: Diagnosis present

## 2019-03-31 DIAGNOSIS — J9690 Respiratory failure, unspecified, unspecified whether with hypoxia or hypercapnia: Secondary | ICD-10-CM

## 2019-03-31 DIAGNOSIS — J15212 Pneumonia due to Methicillin resistant Staphylococcus aureus: Secondary | ICD-10-CM | POA: Diagnosis present

## 2019-03-31 DIAGNOSIS — R4182 Altered mental status, unspecified: Secondary | ICD-10-CM | POA: Diagnosis not present

## 2019-03-31 DIAGNOSIS — E785 Hyperlipidemia, unspecified: Secondary | ICD-10-CM | POA: Diagnosis present

## 2019-03-31 DIAGNOSIS — J189 Pneumonia, unspecified organism: Secondary | ICD-10-CM

## 2019-03-31 DIAGNOSIS — R6520 Severe sepsis without septic shock: Secondary | ICD-10-CM | POA: Diagnosis present

## 2019-03-31 DIAGNOSIS — G9340 Encephalopathy, unspecified: Secondary | ICD-10-CM | POA: Diagnosis not present

## 2019-03-31 DIAGNOSIS — A4102 Sepsis due to Methicillin resistant Staphylococcus aureus: Secondary | ICD-10-CM | POA: Diagnosis present

## 2019-03-31 DIAGNOSIS — A419 Sepsis, unspecified organism: Secondary | ICD-10-CM

## 2019-03-31 DIAGNOSIS — Z9911 Dependence on respirator [ventilator] status: Secondary | ICD-10-CM

## 2019-03-31 DIAGNOSIS — J9601 Acute respiratory failure with hypoxia: Secondary | ICD-10-CM | POA: Diagnosis present

## 2019-03-31 DIAGNOSIS — J984 Other disorders of lung: Secondary | ICD-10-CM

## 2019-03-31 DIAGNOSIS — T50902D Poisoning by unspecified drugs, medicaments and biological substances, intentional self-harm, subsequent encounter: Secondary | ICD-10-CM | POA: Diagnosis not present

## 2019-03-31 DIAGNOSIS — F329 Major depressive disorder, single episode, unspecified: Secondary | ICD-10-CM | POA: Diagnosis present

## 2019-03-31 DIAGNOSIS — J690 Pneumonitis due to inhalation of food and vomit: Secondary | ICD-10-CM | POA: Diagnosis present

## 2019-03-31 DIAGNOSIS — M50022 Cervical disc disorder at C5-C6 level with myelopathy: Secondary | ICD-10-CM | POA: Diagnosis present

## 2019-03-31 DIAGNOSIS — R401 Stupor: Secondary | ICD-10-CM | POA: Diagnosis not present

## 2019-03-31 DIAGNOSIS — Z79890 Hormone replacement therapy (postmenopausal): Secondary | ICD-10-CM | POA: Diagnosis not present

## 2019-03-31 DIAGNOSIS — K219 Gastro-esophageal reflux disease without esophagitis: Secondary | ICD-10-CM | POA: Diagnosis present

## 2019-03-31 DIAGNOSIS — J9602 Acute respiratory failure with hypercapnia: Secondary | ICD-10-CM | POA: Diagnosis present

## 2019-03-31 DIAGNOSIS — R569 Unspecified convulsions: Secondary | ICD-10-CM | POA: Diagnosis not present

## 2019-03-31 DIAGNOSIS — J9600 Acute respiratory failure, unspecified whether with hypoxia or hypercapnia: Secondary | ICD-10-CM

## 2019-03-31 DIAGNOSIS — R410 Disorientation, unspecified: Secondary | ICD-10-CM | POA: Diagnosis not present

## 2019-03-31 DIAGNOSIS — K3184 Gastroparesis: Secondary | ICD-10-CM | POA: Diagnosis not present

## 2019-03-31 DIAGNOSIS — Z4659 Encounter for fitting and adjustment of other gastrointestinal appliance and device: Secondary | ICD-10-CM

## 2019-03-31 LAB — URINE DRUG SCREEN, QUALITATIVE (ARMC ONLY)
Amphetamines, Ur Screen: NOT DETECTED
Barbiturates, Ur Screen: NOT DETECTED
Benzodiazepine, Ur Scrn: POSITIVE — AB
Cannabinoid 50 Ng, Ur ~~LOC~~: NOT DETECTED
Cocaine Metabolite,Ur ~~LOC~~: NOT DETECTED
MDMA (Ecstasy)Ur Screen: NOT DETECTED
Methadone Scn, Ur: NOT DETECTED
Opiate, Ur Screen: NOT DETECTED
Phencyclidine (PCP) Ur S: NOT DETECTED
Tricyclic, Ur Screen: NOT DETECTED

## 2019-03-31 LAB — CBC WITH DIFFERENTIAL/PLATELET
Abs Immature Granulocytes: 0.02 10*3/uL (ref 0.00–0.07)
Basophils Absolute: 0 10*3/uL (ref 0.0–0.1)
Basophils Relative: 0 %
Eosinophils Absolute: 0 10*3/uL (ref 0.0–0.5)
Eosinophils Relative: 0 %
HCT: 44.5 % (ref 36.0–46.0)
Hemoglobin: 14.7 g/dL (ref 12.0–15.0)
Immature Granulocytes: 0 %
Lymphocytes Relative: 7 %
Lymphs Abs: 0.6 10*3/uL — ABNORMAL LOW (ref 0.7–4.0)
MCH: 31 pg (ref 26.0–34.0)
MCHC: 33 g/dL (ref 30.0–36.0)
MCV: 93.9 fL (ref 80.0–100.0)
Monocytes Absolute: 0.6 10*3/uL (ref 0.1–1.0)
Monocytes Relative: 7 %
Neutro Abs: 7.1 10*3/uL (ref 1.7–7.7)
Neutrophils Relative %: 86 %
Platelets: 274 10*3/uL (ref 150–400)
RBC: 4.74 MIL/uL (ref 3.87–5.11)
RDW: 12.6 % (ref 11.5–15.5)
WBC: 8.2 10*3/uL (ref 4.0–10.5)
nRBC: 0 % (ref 0.0–0.2)

## 2019-03-31 LAB — COMPREHENSIVE METABOLIC PANEL
ALT: 22 U/L (ref 0–44)
AST: 42 U/L — ABNORMAL HIGH (ref 15–41)
Albumin: 4 g/dL (ref 3.5–5.0)
Alkaline Phosphatase: 70 U/L (ref 38–126)
Anion gap: 12 (ref 5–15)
BUN: 18 mg/dL (ref 8–23)
CO2: 21 mmol/L — ABNORMAL LOW (ref 22–32)
Calcium: 8.2 mg/dL — ABNORMAL LOW (ref 8.9–10.3)
Chloride: 107 mmol/L (ref 98–111)
Creatinine, Ser: 1.09 mg/dL — ABNORMAL HIGH (ref 0.44–1.00)
GFR calc Af Amer: >60 mL/min (ref >60)
GFR calc non Af Amer: 52 mL/min — ABNORMAL LOW (ref >60)
Glucose, Bld: 148 mg/dL — ABNORMAL HIGH (ref 70–99)
Potassium: 4.4 mmol/L (ref 3.5–5.1)
Sodium: 140 mmol/L (ref 135–145)
Total Bilirubin: 0.8 mg/dL (ref 0.3–1.2)
Total Protein: 7.4 g/dL (ref 6.5–8.1)

## 2019-03-31 LAB — URINALYSIS, COMPLETE (UACMP) WITH MICROSCOPIC
Bacteria, UA: NONE SEEN
Bilirubin Urine: NEGATIVE
Glucose, UA: NEGATIVE mg/dL
Ketones, ur: NEGATIVE mg/dL
Leukocytes,Ua: NEGATIVE
Nitrite: NEGATIVE
Protein, ur: NEGATIVE mg/dL
Specific Gravity, Urine: 1.012 (ref 1.005–1.030)
pH: 5 (ref 5.0–8.0)

## 2019-03-31 LAB — BLOOD GAS, ARTERIAL
Acid-base deficit: 2.5 mmol/L — ABNORMAL HIGH (ref 0.0–2.0)
Allens test (pass/fail): POSITIVE — AB
Bicarbonate: 24.2 mmol/L (ref 20.0–28.0)
FIO2: 100
MECHVT: 450 mL
O2 Saturation: 98.9 %
PEEP: 5 cmH2O
Patient temperature: 37
RATE: 16 resp/min
pCO2 arterial: 48 mmHg (ref 32.0–48.0)
pH, Arterial: 7.31 — ABNORMAL LOW (ref 7.350–7.450)
pO2, Arterial: 136 mmHg — ABNORMAL HIGH (ref 83.0–108.0)

## 2019-03-31 LAB — PROTIME-INR
INR: 1 (ref 0.8–1.2)
Prothrombin Time: 12.8 seconds (ref 11.4–15.2)

## 2019-03-31 LAB — LACTIC ACID, PLASMA
Lactic Acid, Venous: 4 mmol/L (ref 0.5–1.9)
Lactic Acid, Venous: 3.7 mmol/L (ref 0.5–1.9)

## 2019-03-31 LAB — ETHANOL: Alcohol, Ethyl (B): <10 mg/dL (ref <10)

## 2019-03-31 LAB — SARS CORONAVIRUS 2 BY RT PCR (HOSPITAL ORDER, PERFORMED IN ~~LOC~~ HOSPITAL LAB): SARS Coronavirus 2: NEGATIVE

## 2019-03-31 LAB — GLUCOSE, CAPILLARY
Glucose-Capillary: 114 mg/dL — ABNORMAL HIGH (ref 70–99)
Glucose-Capillary: 125 mg/dL — ABNORMAL HIGH (ref 70–99)

## 2019-03-31 LAB — MRSA PCR SCREENING: MRSA by PCR: NEGATIVE

## 2019-03-31 LAB — PROCALCITONIN: Procalcitonin: 2.52 ng/mL

## 2019-03-31 LAB — APTT: aPTT: 24 seconds (ref 24–36)

## 2019-03-31 IMAGING — CT CT HEAD W/O CM
3 series · 16 of 45 positions shown, 19 images · non-contrast
Comparison: None.

CLINICAL DATA: Altered level of consciousness.

EXAM:
CT HEAD WITHOUT CONTRAST
TECHNIQUE: Contiguous axial images were obtained from the base of the skull
through the vertex without intravenous contrast.

[Series 3: head wo · axial · 0.47mm/px · z∈[-91,+24]mm · 10 of 28 slices shown, 13 images]
[im 3/28  brain]
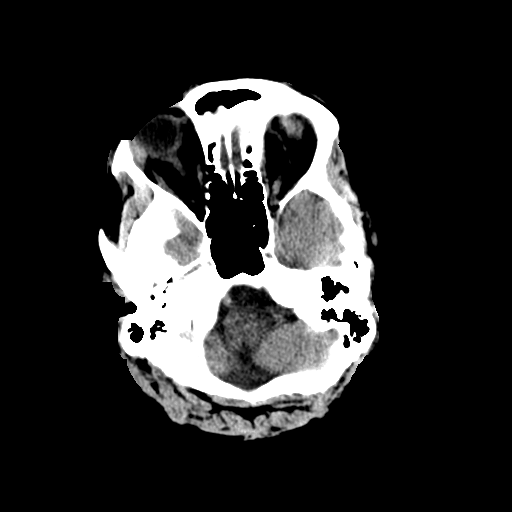
[im 3/28  bone]
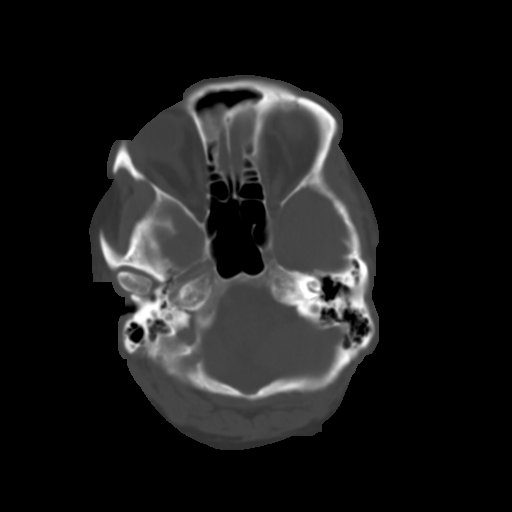
[im 5/28  brain]
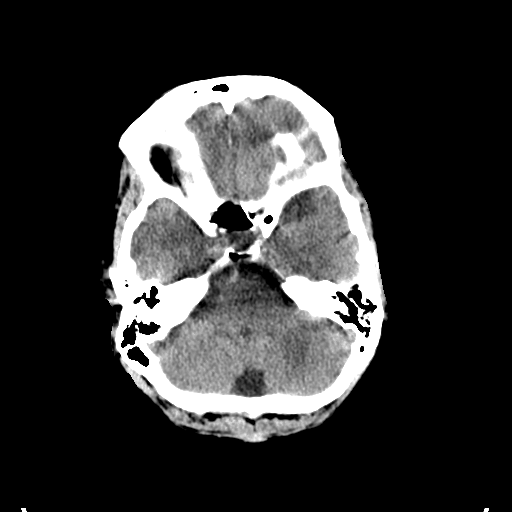
[im 8/28  brain]
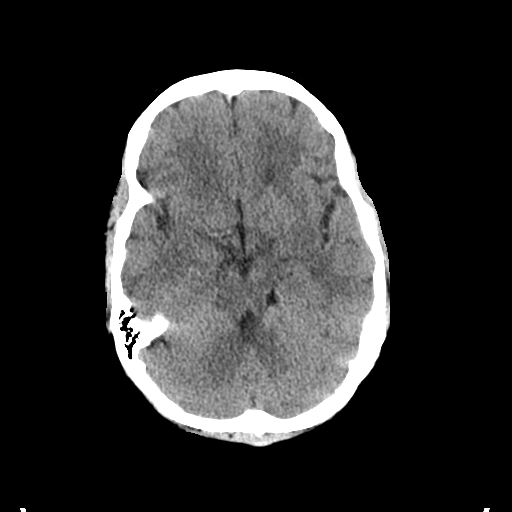
[im 11/28  brain]
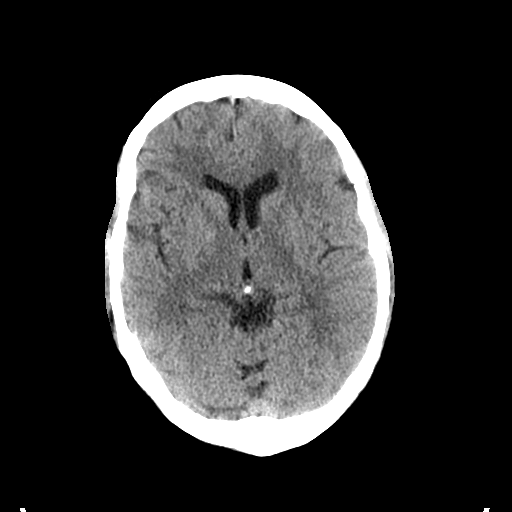
[im 13/28  brain]
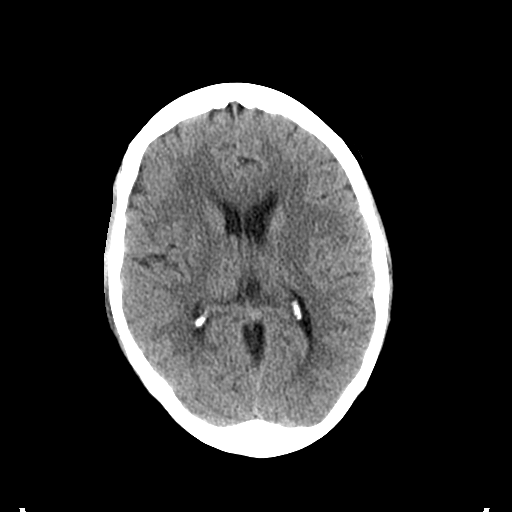
[im 13/28  bone]
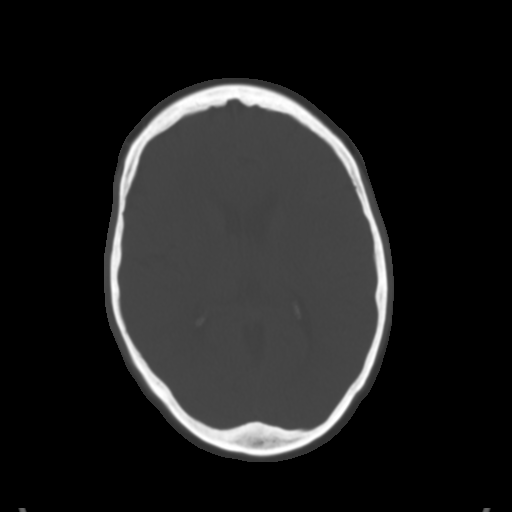
[im 16/28  brain]
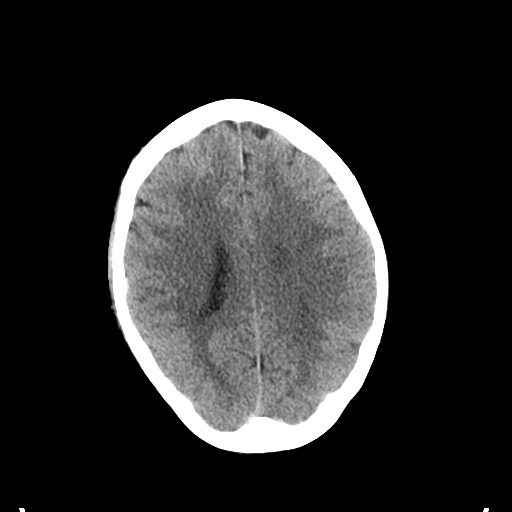
[im 18/28  brain]
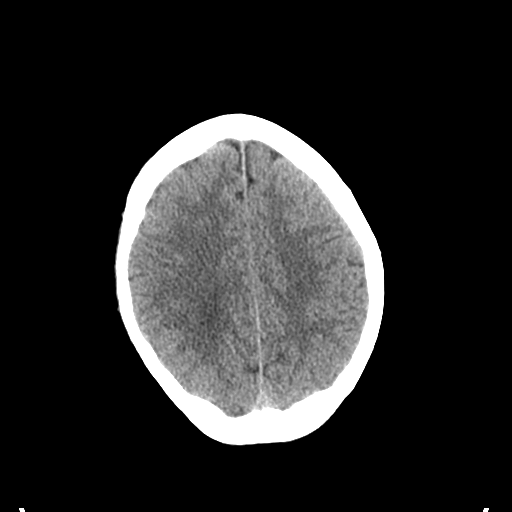
[im 21/28  brain]
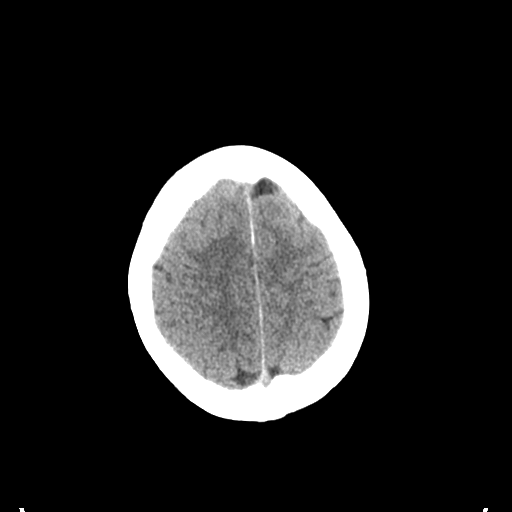
[im 24/28  brain]
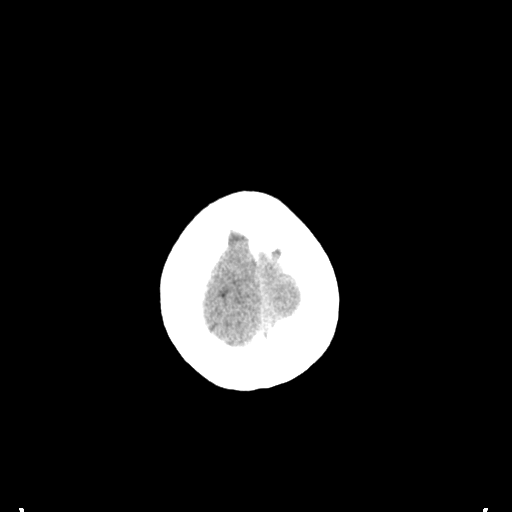
[im 24/28  bone]
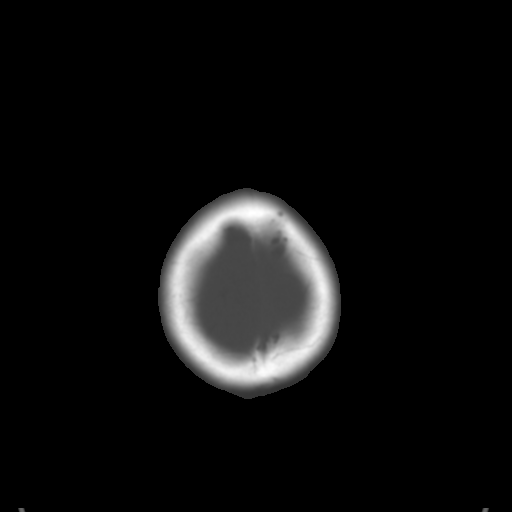
[im 26/28  brain]
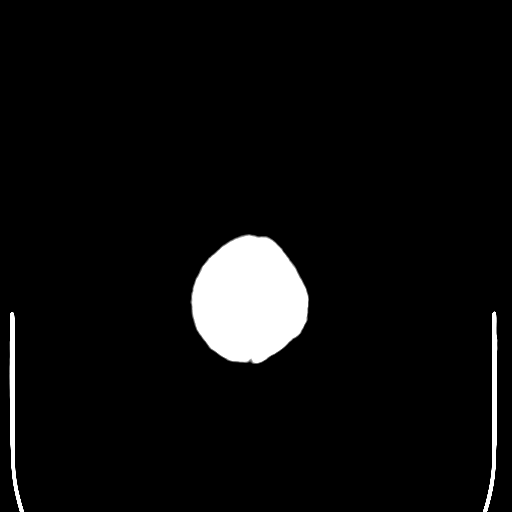

[Series 4: coronal soft tissue · coronal · 0.28mm/px · 3 of 63 slices shown]
[im 21/63  brain]
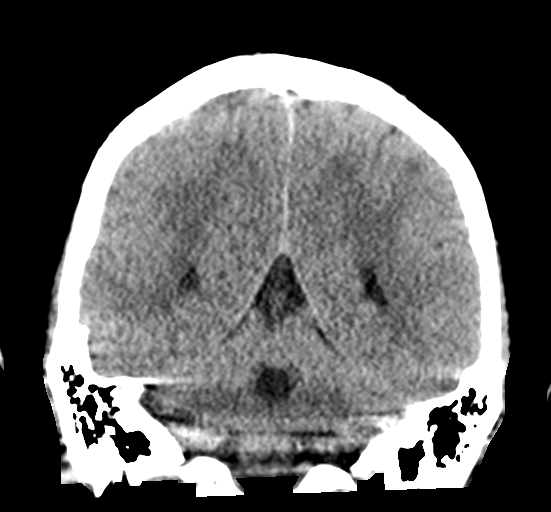
[im 28/63  brain]
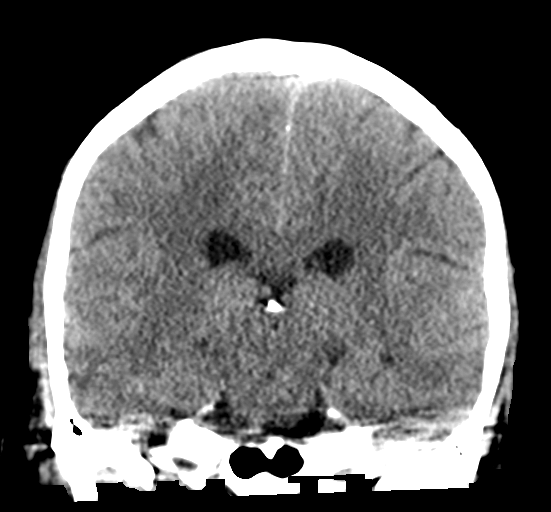
[im 35/63  brain]
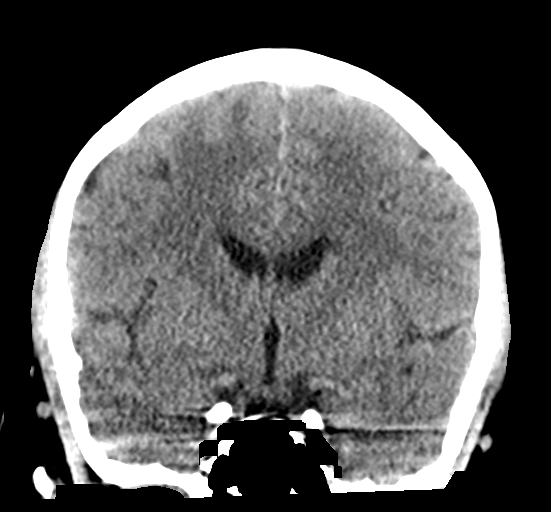

[Series 5: sagittal soft tissue · sagittal · 0.28mm/px · 3 of 50 slices shown]
[im 17/50  brain]
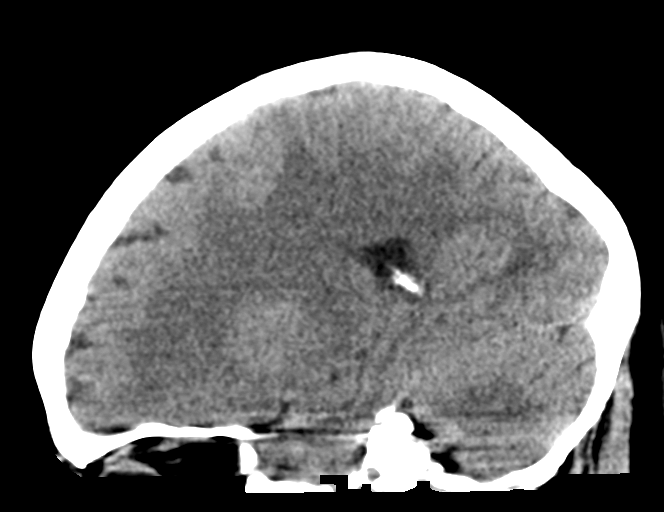
[im 25/50  brain]
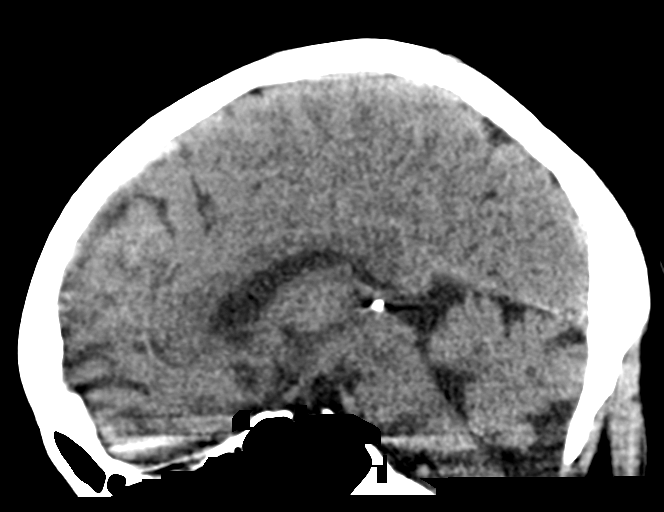
[im 33/50  brain]
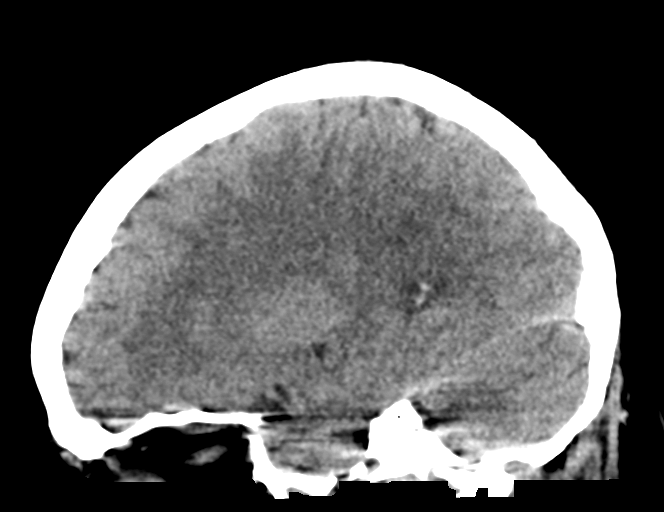

[16 of 45 positions shown; findings below may reference images not displayed]

FINDINGS: Brain: There is no evidence for acute hemorrhage, hydrocephalus,
mass lesion, or abnormal extra-axial fluid collection. No definite
CT evidence for acute infarction.

Vascular: No hyperdense vessel or unexpected calcification.

Skull: No evidence for fracture. No worrisome lytic or sclerotic
lesion.

Sinuses/Orbits: The visualized paranasal sinuses and mastoid air
cells are clear. Visualized portions of the globes and intraorbital
fat are unremarkable.

Other: None.
IMPRESSION: Unremarkable CT evaluation of the brain. No acute intracranial
abnormality.

## 2019-03-31 IMAGING — DX DG ABDOMEN 1V
1 series · 1 of 1 positions shown · non-contrast
Comparison: None.

CLINICAL DATA: Unresponsive.  NG tube placement

EXAM:
ABDOMEN - 1 VIEW

[abdomen supine]
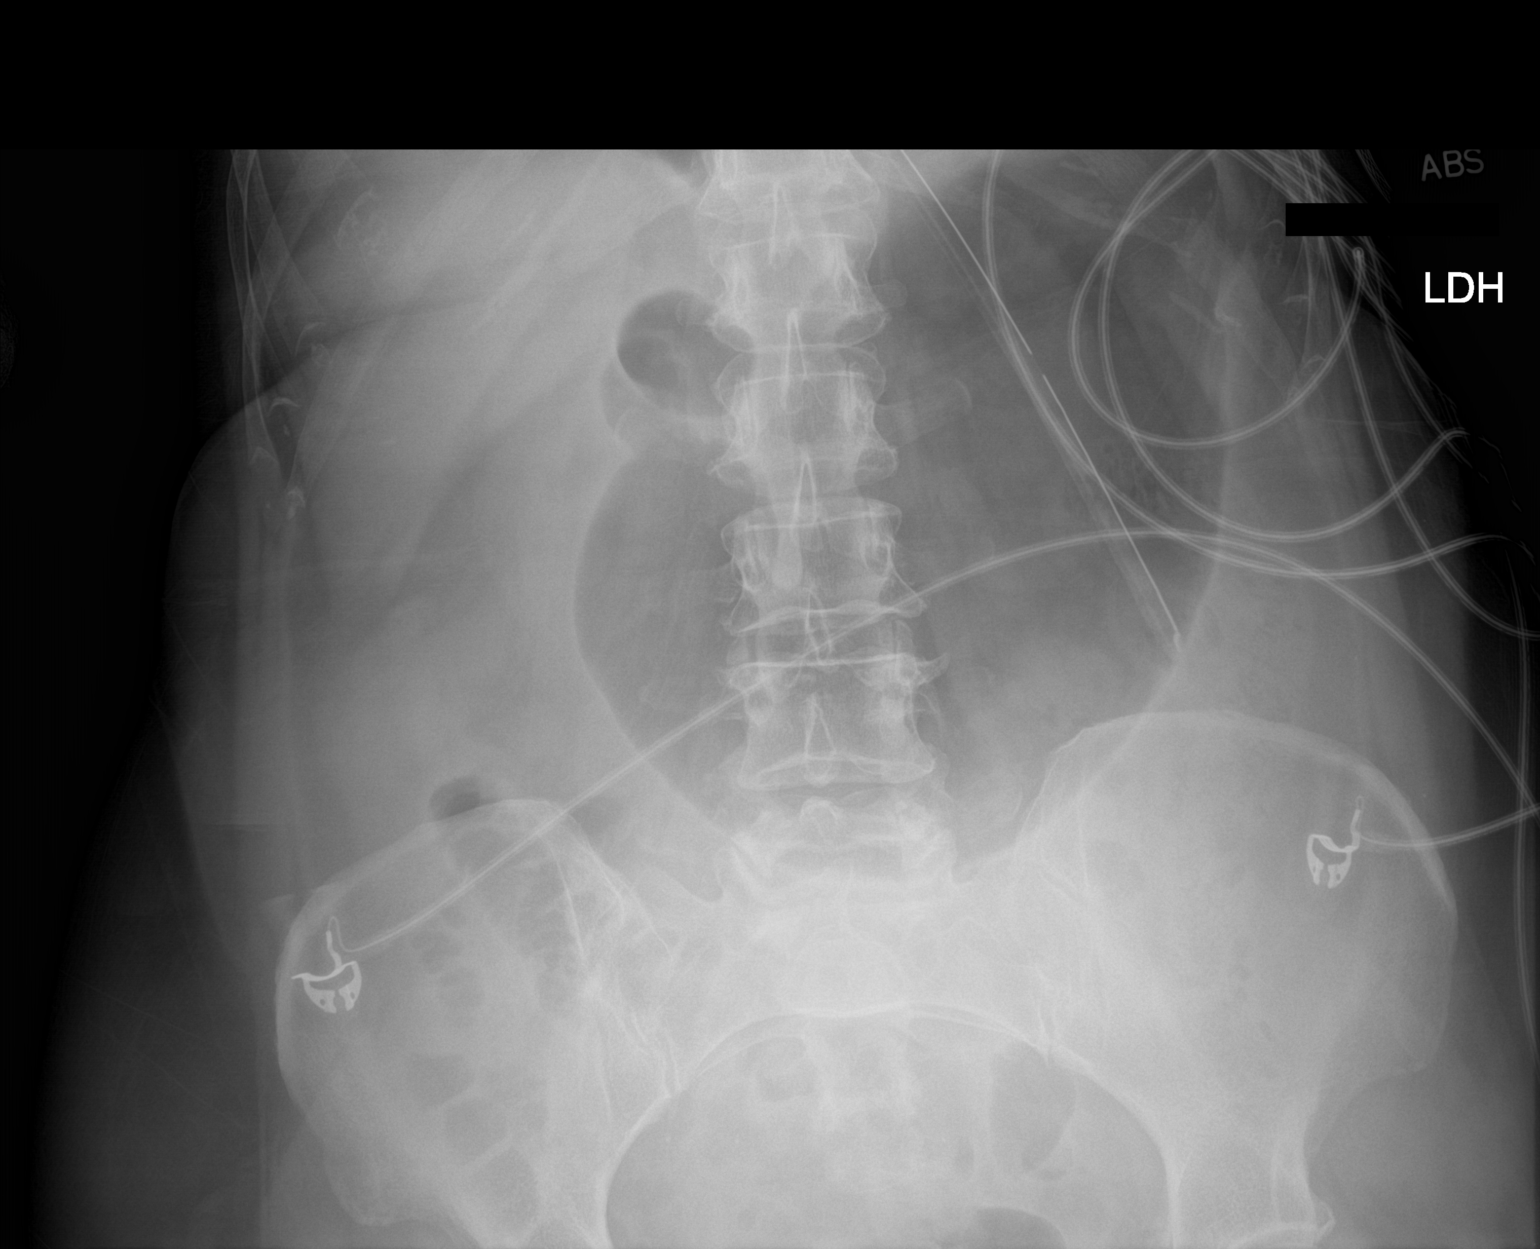

[1 of 1 positions shown; findings below may reference images not displayed]

FINDINGS: [0G] hours. Prominent gastric bubble noted. NG tube tip is in the
mid stomach with proximal port of the NG tube below the EG junction.
No gaseous bowel distention within the visualized abdomen.
Visualized bony anatomy unremarkable.
IMPRESSION: NG tube tip is in the mid stomach.

## 2019-03-31 MED ORDER — PROPOFOL 10 MG/ML IV BOLUS
30.0000 mg | Freq: Once | INTRAVENOUS | Status: AC
  Administered 2019-03-31: 30 mg via INTRAVENOUS

## 2019-03-31 MED ORDER — ENOXAPARIN SODIUM 40 MG/0.4ML ~~LOC~~ SOLN: 40.0000 mg | SUBCUTANEOUS | Status: DC

## 2019-03-31 MED ORDER — ACETAMINOPHEN 325 MG PO TABS
650.0000 mg | ORAL_TABLET | Freq: Four times a day (QID) | ORAL | Status: DC | PRN
  Administered 2019-03-31 – 2019-04-06 (×3): 650 mg via ORAL
  Filled 2019-03-31 (×3): qty 2

## 2019-03-31 MED ORDER — SODIUM CHLORIDE 0.9 % IV SOLN
0.0000 ug/min | INTRAVENOUS | Status: DC
  Administered 2019-03-31: 23:00:00 20 ug/min via INTRAVENOUS
  Filled 2019-03-31 (×2): qty 1

## 2019-03-31 MED ORDER — SODIUM CHLORIDE 0.9 % IV SOLN
2.0000 g | INTRAVENOUS | Status: DC
  Administered 2019-03-31: 2 g via INTRAVENOUS
  Filled 2019-03-31 (×2): qty 20

## 2019-03-31 MED ORDER — SODIUM CHLORIDE 0.9 % IV BOLUS (SEPSIS)
250.0000 mL | Freq: Once | INTRAVENOUS | Status: AC
  Administered 2019-03-31: 14:00:00 250 mL via INTRAVENOUS

## 2019-03-31 MED ORDER — FAMOTIDINE IN NACL 20-0.9 MG/50ML-% IV SOLN: 20.0000 mg | INTRAVENOUS | Status: DC

## 2019-03-31 MED ORDER — CHLORHEXIDINE GLUCONATE 0.12% ORAL RINSE (MEDLINE KIT)
15.0000 mL | Freq: Two times a day (BID) | OROMUCOSAL | Status: DC
  Administered 2019-03-31 – 2019-04-11 (×22): 15 mL via OROMUCOSAL

## 2019-03-31 MED ORDER — FENTANYL CITRATE (PF) 100 MCG/2ML IJ SOLN
25.0000 ug | Freq: Once | INTRAMUSCULAR | Status: AC
  Administered 2019-03-31: 13:00:00 25 ug via INTRAVENOUS
  Filled 2019-03-31: qty 2

## 2019-03-31 MED ORDER — HYDROCORTISONE NA SUCCINATE PF 100 MG IJ SOLR
50.0000 mg | Freq: Three times a day (TID) | INTRAMUSCULAR | Status: DC
  Administered 2019-04-01: 50 mg via INTRAVENOUS
  Filled 2019-03-31: qty 2

## 2019-03-31 MED ORDER — FENTANYL CITRATE (PF) 100 MCG/2ML IJ SOLN
25.0000 ug | INTRAMUSCULAR | Status: DC | PRN
  Administered 2019-04-03: 25 ug via INTRAVENOUS
  Filled 2019-03-31: qty 2

## 2019-03-31 MED ORDER — PROPOFOL 1000 MG/100ML IV EMUL
0.0000 ug/kg/min | INTRAVENOUS | Status: DC
  Administered 2019-03-31: 21:00:00 15 ug/kg/min via INTRAVENOUS
  Administered 2019-03-31: 17:00:00 25 ug/kg/min via INTRAVENOUS
  Filled 2019-03-31: qty 100

## 2019-03-31 MED ORDER — ACETAMINOPHEN 650 MG RE SUPP: 650.0000 mg | Freq: Four times a day (QID) | RECTAL | Status: DC | PRN

## 2019-03-31 MED ORDER — CHLORHEXIDINE GLUCONATE CLOTH 2 % EX PADS
6.0000 | MEDICATED_PAD | Freq: Every day | CUTANEOUS | Status: DC
  Administered 2019-03-31 – 2019-04-07 (×7): 6 via TOPICAL

## 2019-03-31 MED ORDER — ORAL CARE MOUTH RINSE
15.0000 mL | OROMUCOSAL | Status: DC
  Administered 2019-03-31 – 2019-04-11 (×104): 15 mL via OROMUCOSAL

## 2019-03-31 MED ORDER — LACTATED RINGERS IV SOLN
INTRAVENOUS | Status: DC
  Administered 2019-03-31 (×2): via INTRAVENOUS

## 2019-03-31 MED ORDER — ONDANSETRON HCL 4 MG/2ML IJ SOLN
4.0000 mg | Freq: Four times a day (QID) | INTRAMUSCULAR | Status: DC | PRN
  Administered 2019-04-04: 4 mg via INTRAVENOUS
  Filled 2019-03-31: qty 2

## 2019-03-31 MED ORDER — SODIUM CHLORIDE 0.9 % IV BOLUS (SEPSIS)
1000.0000 mL | Freq: Once | INTRAVENOUS | Status: AC
  Administered 2019-03-31: 14:00:00 1000 mL via INTRAVENOUS

## 2019-03-31 MED ORDER — ETOMIDATE 2 MG/ML IV SOLN
20.0000 mg | Freq: Once | INTRAVENOUS | Status: AC
  Administered 2019-03-31: 20 mg via INTRAVENOUS

## 2019-03-31 MED ORDER — SODIUM CHLORIDE 0.9 % IV BOLUS: 1000.0000 mL | Freq: Once | INTRAVENOUS | Status: DC

## 2019-03-31 MED ORDER — HYDROCORTISONE NA SUCCINATE PF 100 MG IJ SOLR
100.0000 mg | Freq: Once | INTRAMUSCULAR | Status: AC
  Administered 2019-03-31: 23:00:00 100 mg via INTRAVENOUS
  Filled 2019-03-31: qty 2

## 2019-03-31 MED ORDER — PHENYLEPHRINE HCL-NACL 10-0.9 MG/250ML-% IV SOLN
0.0000 ug/min | INTRAVENOUS | Status: DC
  Filled 2019-03-31: qty 250

## 2019-03-31 MED ORDER — FENTANYL CITRATE (PF) 100 MCG/2ML IJ SOLN
25.0000 ug | INTRAMUSCULAR | Status: DC | PRN
  Administered 2019-03-31: 25 ug via INTRAVENOUS
  Administered 2019-04-03 (×3): 100 ug via INTRAVENOUS
  Administered 2019-04-05: 03:00:00 50 ug via INTRAVENOUS
  Administered 2019-04-05: 01:00:00 25 ug via INTRAVENOUS
  Administered 2019-04-05 (×2): 100 ug via INTRAVENOUS
  Administered 2019-04-05: 25 ug via INTRAVENOUS
  Administered 2019-04-06 – 2019-04-07 (×3): 100 ug via INTRAVENOUS
  Filled 2019-03-31 (×11): qty 2

## 2019-03-31 MED ORDER — PHENYLEPHRINE HCL-NACL 10-0.9 MG/250ML-% IV SOLN: 0.0000 ug/min | INTRAVENOUS | Status: DC

## 2019-03-31 MED ORDER — SUCCINYLCHOLINE CHLORIDE 20 MG/ML IJ SOLN
100.0000 mg | Freq: Once | INTRAMUSCULAR | Status: AC
  Administered 2019-03-31: 100 mg via INTRAVENOUS

## 2019-03-31 MED ORDER — SODIUM CHLORIDE 0.9 % IV BOLUS
1000.0000 mL | Freq: Once | INTRAVENOUS | Status: AC
  Administered 2019-03-31: 1000 mL via INTRAVENOUS

## 2019-03-31 MED ORDER — POLYETHYLENE GLYCOL 3350 17 G PO PACK: 17.0000 g | PACK | Freq: Every day | ORAL | Status: DC | PRN

## 2019-03-31 MED ORDER — LORAZEPAM 2 MG/ML IJ SOLN
2.0000 mg | Freq: Once | INTRAMUSCULAR | Status: AC
  Administered 2019-03-31: 11:00:00 2 mg via INTRAVENOUS

## 2019-03-31 MED ORDER — SODIUM CHLORIDE 0.9 % IV SOLN
500.0000 mg | INTRAVENOUS | Status: DC
  Administered 2019-03-31: 13:00:00 500 mg via INTRAVENOUS
  Filled 2019-03-31 (×2): qty 500

## 2019-03-31 MED ORDER — ONDANSETRON HCL 4 MG PO TABS: 4.0000 mg | ORAL_TABLET | Freq: Four times a day (QID) | ORAL | Status: DC | PRN

## 2019-03-31 MED ORDER — PROPOFOL 10 MG/ML IV BOLUS
40.0000 mg | Freq: Once | INTRAVENOUS | Status: AC
  Administered 2019-03-31: 14:00:00 40 mg via INTRAVENOUS

## 2019-03-31 MED ORDER — HYDROCORTISONE NA SUCCINATE PF 100 MG IJ SOLR: 50.0000 mg | Freq: Three times a day (TID) | INTRAMUSCULAR | Status: DC

## 2019-03-31 MED ORDER — SODIUM CHLORIDE 0.9 % IV SOLN
1000.0000 mL | Freq: Once | INTRAVENOUS | Status: AC
  Administered 2019-03-31: 15:00:00 1000 mL via INTRAVENOUS

## 2019-03-31 MED ORDER — LACTATED RINGERS IV BOLUS
500.0000 mL | Freq: Once | INTRAVENOUS | Status: AC
  Administered 2019-03-31: 21:00:00 500 mL via INTRAVENOUS

## 2019-03-31 MED ORDER — SODIUM CHLORIDE 0.9 % IV SOLN
3.0000 g | Freq: Four times a day (QID) | INTRAVENOUS | Status: DC
  Administered 2019-03-31 – 2019-04-05 (×18): 3 g via INTRAVENOUS
  Filled 2019-03-31: qty 3
  Filled 2019-03-31: qty 8
  Filled 2019-03-31 (×2): qty 3
  Filled 2019-03-31: qty 8
  Filled 2019-03-31 (×2): qty 3
  Filled 2019-03-31: qty 8
  Filled 2019-03-31: qty 3
  Filled 2019-03-31: qty 8
  Filled 2019-03-31: qty 3
  Filled 2019-03-31: qty 8
  Filled 2019-03-31 (×4): qty 3
  Filled 2019-03-31: qty 8
  Filled 2019-03-31 (×5): qty 3
  Filled 2019-03-31: qty 8

## 2019-03-31 MED ORDER — HYDROCORTISONE NA SUCCINATE PF 100 MG IJ SOLR: 100.0000 mg | Freq: Three times a day (TID) | INTRAMUSCULAR | Status: DC

## 2019-03-31 MED ORDER — LACTATED RINGERS IV BOLUS
500.0000 mL | Freq: Once | INTRAVENOUS | Status: AC
  Administered 2019-03-31: 500 mL via INTRAVENOUS

## 2019-03-31 MED ORDER — SODIUM CHLORIDE 0.9 % IV SOLN: INTRAVENOUS | Status: DC

## 2019-03-31 MED ORDER — PROPOFOL 1000 MG/100ML IV EMUL
5.0000 ug/kg/min | INTRAVENOUS | Status: DC
  Administered 2019-03-31: 5 ug/kg/min via INTRAVENOUS

## 2019-03-31 NOTE — Consult Note (Signed)
PULMONARY/CCM NOTE  PT PROFILE: 68 y.o. F never smoker intubated in ED for decreased LOC and acute hypoxemic respiratory failure. Little history available. CXR c/w PNA  MAJOR EVENTS/TEST RESULTS: 09/12 Admitted via ED, intubated with admission dx of CAP 09/12 CT head: No intracranial abnormalities noted  INDWELLING DEVICES: ETT 09/12 >>    MICRO DATA: SARS-CoV-2 PCR 09/12 >> NEG MRSA PCR 09/12 >>  Urine 09/12 >>  Resp 09/12 >>  Blood 09/12 >>  Strep urine antigen 9/12 >>  Legionella urine antigen 9/12 >>   PCT 09/12: 2.52,   ANTIMICROBIALS:  Anti-infectives (From admission, onward)   Start     Dose/Rate Route Frequency Ordered Stop   04/17/2019 1230  cefTRIAXone (ROCEPHIN) 2 g in sodium chloride 0.9 % 100 mL IVPB     2 g 200 mL/hr over 30 Minutes Intravenous Every 24 hours 04/09/2019 1224     03/21/2019 1230  azithromycin (ZITHROMAX) 500 mg in sodium chloride 0.9 % 250 mL IVPB     500 mg 250 mL/hr over 60 Minutes Intravenous Every 24 hours 03/24/2019 1224        HPI: Level 5 caveat.  Patient is intubated and no family is available.  ED records reviewed.  Past Medical History:  Diagnosis Date  . Arthritis    fingers  . GERD (gastroesophageal reflux disease)   . Hyperlipidemia   . Hypothyroidism    Social History   Socioeconomic History  . Marital status: Married    Spouse name: Not on file  . Number of children: Not on file  . Years of education: Not on file  . Highest education level: Not on file  Occupational History  . Not on file  Social Needs  . Financial resource strain: Not on file  . Food insecurity    Worry: Not on file    Inability: Not on file  . Transportation needs    Medical: Not on file    Non-medical: Not on file  Tobacco Use  . Smoking status: Never Smoker  . Smokeless tobacco: Never Used  Substance and Sexual Activity  . Alcohol use: Yes    Alcohol/week: 1.0 standard drinks    Types: 1 Glasses of wine per week  . Drug use: Not on file   . Sexual activity: Not on file  Lifestyle  . Physical activity    Days per week: Not on file    Minutes per session: Not on file  . Stress: Not on file  Relationships  . Social Herbalist on phone: Not on file    Gets together: Not on file    Attends religious service: Not on file    Active member of club or organization: Not on file    Attends meetings of clubs or organizations: Not on file    Relationship status: Not on file  . Intimate partner violence    Fear of current or ex partner: Not on file    Emotionally abused: Not on file    Physically abused: Not on file    Forced sexual activity: Not on file  Other Topics Concern  . Not on file  Social History Narrative  . Not on file    Family History  Problem Relation Age of Onset  . Breast cancer Neg Hx      SUBJ:   OBJ: Vitals:   04/14/2019 1400 04/15/2019 1415 04/18/2019 1430 04/05/2019 1435  BP: 124/70 112/62 (!) 105/56 94/62  Pulse: (!) 126 (!)  124 (!) 123 (!) 119  Resp: (!) 42 (!) 40 (!) 33 (!) 34  Temp: 100.2 F (37.9 C) 100.3 F (37.9 C) (!) 100.4 F (38 C) (!) 100.5 F (38.1 C)  SpO2: 92% (!) 89% 95% 96%  Weight:      Height:        Vent Mode: AC FiO2 (%):  [50 %-100 %] 75 % Set Rate:  [16 bmp-30 bmp] 20 bmp Vt Set:  [450 mL-500 mL] 500 mL PEEP:  [5 cmH20-8 cmH20] 8 cmH20  Gen: Intubated, sedated, tachypneic, unresponsive HEENT: NCAT, sclerae white Neck: No LAN, no JVD noted Lungs: R >L rhonchi, no wheezes Cardiovascular: Tachycardic, no M noted Abdomen: Soft, NT, +BS Ext: Cool, no edema Neuro: PERRL, EOMI, no withdrawal from pain, moves all extremities Skin: No lesions noted   BMP Latest Ref Rng & Units 03/30/2019  Glucose 70 - 99 mg/dL 148(H)  BUN 8 - 23 mg/dL 18  Creatinine 0.44 - 1.00 mg/dL 1.09(H)  Sodium 135 - 145 mmol/L 140  Potassium 3.5 - 5.1 mmol/L 4.4  Chloride 98 - 111 mmol/L 107  CO2 22 - 32 mmol/L 21(L)  Calcium 8.9 - 10.3 mg/dL 8.2(L)    Hepatic Function Latest  Ref Rng & Units 03/22/2019  Total Protein 6.5 - 8.1 g/dL 7.4  Albumin 3.5 - 5.0 g/dL 4.0  AST 15 - 41 U/L 42(H)  ALT 0 - 44 U/L 22  Alk Phosphatase 38 - 126 U/L 70  Total Bilirubin 0.3 - 1.2 mg/dL 0.8    CBC Latest Ref Rng & Units 04/12/2019  WBC 4.0 - 10.5 K/uL 8.2  Hemoglobin 12.0 - 15.0 g/dL 14.7  Hematocrit 36.0 - 46.0 % 44.5  Platelets 150 - 400 K/uL 274    ABG    Component Value Date/Time   PHART 7.31 (L) 04/05/2019 1112   PCO2ART 48 04/16/2019 1112   PO2ART 136 (H) 03/27/2019 1112   HCO3 24.2 03/30/2019 1112   ACIDBASEDEF 2.5 (H) 04/18/2019 1112   O2SAT 98.9 04/07/2019 1112   CXR: Extensive R sided infiltrate, predominantly in RUL   IMPRESSION: Acute hypoxemic respiratory failure Community-acquired pneumonia Severe sepsis Sinus tachycardia  PLAN/REC: Vent settings established Vent bundle implemented Daily SBT as indicated Monitor temp, WBC count Micro and abx as above DVT px: SQ heparin Monitor CBC intermittently Transfuse per usual guidelines DVT px: enoxaparin Monitor CBC intermittently Transfuse per usual guidelines PAD protocol - propofol gtt, intermittent fentanyl   CCM time: 35 mins The above time includes time spent in consultation with patient and/or family members and reviewing care plan on multidisciplinary rounds  Merton Border, MD PCCM service Mobile [phone] Pager [phone] 04/03/2019 2:50 PM

## 2019-03-31 NOTE — Progress Notes (Signed)
Notified bedside nurse of need to draw lactic acid @ 1338, needs 2178 cc fluid.

## 2019-03-31 NOTE — ED Notes (Signed)
This RN spoke with Dr. Corky Downs regarding continued hypotension, Dr. Corky Downs placed order for 1L NS bolus.

## 2019-03-31 NOTE — ED Notes (Signed)
Date and time results received: 04/03/2019 1254 (use smartphrase ".now" to insert current time)  Test: Lactic Critical Value: 4  Name of Provider Notified: Dr. Corky Downs  Orders Received? Or Actions Taken?: Critical Results Received

## 2019-03-31 NOTE — ED Provider Notes (Signed)
[facility] Emergency Department Provider Note   ____________________________________________    I have reviewed the triage vital signs and the nursing notes.   HISTORY  Chief Complaint Unresponsive  History limited by patient unresponsive  HPI Sonia Farrell is a 68 y.o. female brought in by EMS unresponsive.  Apparently found unresponsive this morning by family.  Has not been complaining of feeling sick per EMS.  Does have a history of depression and has been taking SSRI but no evidence of overdose.  Past Medical History:  Diagnosis Date  . Arthritis    fingers  . GERD (gastroesophageal reflux disease)   . Hyperlipidemia   . Hypothyroidism     There are no active problems to display for this patient.   Past Surgical History:  Procedure Laterality Date  . CATARACT EXTRACTION W/PHACO Left 12/26/2018   Procedure: CATARACT EXTRACTION PHACO AND INTRAOCULAR LENS PLACEMENT ([place]) LEFT;  Surgeon: Birder Robson, MD;  Location: [place];  Service: Ophthalmology;  Laterality: Left;  . CATARACT EXTRACTION W/PHACO Right 01/23/2019   Procedure: CATARACT EXTRACTION PHACO AND INTRAOCULAR LENS PLACEMENT ([place]) RIGHT;  Surgeon: Birder Robson, MD;  Location: [place];  Service: Ophthalmology;  Laterality: Right;  . COLONOSCOPY      Prior to Admission medications   Medication Sig Start Date End Date Taking? Authorizing Provider  ALPRAZolam Duanne Moron) 0.5 MG tablet Take 0.5 mg by mouth as needed for anxiety.   Yes [provider]  levothyroxine (SYNTHROID) 75 MCG tablet Take 75 mcg by mouth daily before breakfast.   Yes [provider]  pantoprazole (PROTONIX) 40 MG tablet Take 40 mg by mouth daily as needed.   Yes [provider]  PARoxetine (PAXIL) 10 MG tablet Take 10 mg by mouth daily.   Yes [provider]  rosuvastatin (CRESTOR) 10 MG tablet Take 10 mg by mouth daily.   Yes [provider]  acetaminophen (TYLENOL) 325 MG tablet Take 650 mg by mouth every 6 (six) hours as needed.    [provider]     Allergies Escitalopram oxalate  Family History  Problem Relation Age of Onset  . Breast cancer Neg Hx     Social History Social History   Tobacco Use  . Smoking status: Never Smoker  . Smokeless tobacco: Never Used  Substance Use Topics  . Alcohol use: Yes    Alcohol/week: 1.0 standard drinks    Types: 1 Glasses of wine per week  . Drug use: Not on file    Level 5 caveat, unable to obtain review of Systems due to patient distress     ____________________________________________   PHYSICAL EXAM:  VITAL SIGNS: ED Triage Vitals  Enc Vitals Group     BP 04/05/2019 1100 (!) 127/57     Pulse Rate 04/12/2019 1100 (!) 125     Resp 03/24/2019 1100 (!) 35     Temp --      Temp src --      SpO2 04/17/2019 1100 (!) 85 %     Weight 04/08/2019 1115 72.6 kg (160 lb)     Height 03/23/2019 1115 1.702 m (5\' 7" )     Head Circumference --      Peak Flow --      Pain Score --      Pain Loc --      Pain Edu? --      Excl. in [place]? --     Constitutional: Unresponsive, occasionally moves  in extremity Eyes: Conjunctivae are normal.  PERRLA Head: Atraumatic. Nose: Possible dried blood on the naris versus gastric secretion Mouth/Throat: Mucous membranes are moist.    Cardiovascular: Tachycardia, regular rhythm. Grossly normal heart sounds.  Good peripheral circulation. Respiratory: Increased respiratory effort with tachypnea, retractions, bibasilar Rales Gastrointestinal: Soft and nontender. No distention.   Musculoskeletal: Warm and well perfused Neurologic: Unable to examine, Skin:  Skin is warm, dry and intact. No rash noted. Psychiatric: Unable to examine  ____________________________________________   LABS (all labs ordered are listed, but only abnormal results are displayed)  Labs Reviewed  COMPREHENSIVE METABOLIC PANEL - Abnormal; Notable for the  following components:      Result Value   CO2 21 (*)    Glucose, Bld 148 (*)    Creatinine, Ser 1.09 (*)    Calcium 8.2 (*)    AST 42 (*)    GFR calc non Af Amer 52 (*)    All other components within normal limits  CBC WITH DIFFERENTIAL/PLATELET - Abnormal; Notable for the following components:   Lymphs Abs 0.6 (*)    All other components within normal limits  URINALYSIS, COMPLETE (UACMP) WITH MICROSCOPIC - Abnormal; Notable for the following components:   Color, Urine YELLOW (*)    APPearance CLOUDY (*)    Hgb urine dipstick LARGE (*)    All other components within normal limits  LACTIC ACID, PLASMA - Abnormal; Notable for the following components:   Lactic Acid, Venous 4.0 (*)    All other components within normal limits  BLOOD GAS, ARTERIAL - Abnormal; Notable for the following components:   pH, Arterial 7.31 (*)    pO2, Arterial 136 (*)    Acid-base deficit 2.5 (*)    Allens test (pass/fail) POSITIVE (*)    All other components within normal limits  URINE DRUG SCREEN, QUALITATIVE ([facility] ONLY) - Abnormal; Notable for the following components:   Benzodiazepine, Ur Scrn POSITIVE (*)    All other components within normal limits  SARS CORONAVIRUS 2 (HOSPITAL ORDER, PERFORMED IN [facility])  CULTURE, BLOOD (ROUTINE X 2)  CULTURE, BLOOD (ROUTINE X 2)  URINE CULTURE  CULTURE, RESPIRATORY  APTT  PROTIME-INR  PROCALCITONIN  ETHANOL  LACTIC ACID, PLASMA   ____________________________________________  EKG  ED ECG REPORT I, Jene Every, the attending physician, personally viewed and interpreted this ECG.  Date: 04/10/2019  Rhythm: Sinus tachycardia QRS Axis: normal Intervals: normal ST/T Wave abnormalities: normal Narrative Interpretation: no evidence of acute ischemia  ____________________________________________  RADIOLOGY  cxr severe pna right lung on my read ____________________________________________   PROCEDURES  Procedure(s) performed: No   Procedure Name: Intubation Date/Time: 04/10/2019 1:06 PM Performed by: Jene Every, MD Pre-anesthesia Checklist: Patient identified, Patient being monitored, Emergency Drugs available, Timeout performed and Suction available Oxygen Delivery Method: Non-rebreather mask Preoxygenation: Pre-oxygenation with 100% oxygen Induction Type: Rapid sequence Ventilation: Mask ventilation without difficulty Laryngoscope Size: Glidescope and 4 Grade View: Grade I Tube size: 7.5 mm Number of attempts: 1 Placement Confirmation: ETT inserted through vocal cords under direct vision,  CO2 detector and Breath sounds checked- equal and bilateral Secured at: 23 cm Tube secured with: ETT holder        Critical Care performed: yes  CRITICAL CARE Performed by: Jene Every   Total critical care time: 40 minutes  Critical care time was exclusive of separately billable procedures and treating other patients.  Critical care was necessary to treat or prevent imminent or life-threatening deterioration.  Critical care was  time spent personally by me on the following activities: development of treatment plan with patient and/or surrogate as well as nursing, discussions with consultants, evaluation of patient's response to treatment, examination of patient, obtaining history from patient or surrogate, ordering and performing treatments and interventions, ordering and review of laboratory studies, ordering and review of radiographic studies, pulse oximetry and re-evaluation of patient's condition.  ____________________________________________   INITIAL IMPRESSION / ASSESSMENT AND PLAN / ED COURSE  Pertinent labs & imaging results that were available during my care of the patient were reviewed by me and considered in my medical decision making (see chart for details).  Patient presents unresponsive with respiratory distress.  Possibility of overdose versus aspiration versus pneumonia.  Patient satting  low 80s on nonrebreather, decision made to intubate upon arrival.  Black sticky substance noted in pharynx during intubation question aspiration, question GI bleed.  Pending labs including chest x-ray, coronavirus swab.  Propofol drip  Post intubation greatly improved oxygen saturations, blood pressure stable  Family updated on status, patient apparently lost her husband 3 weeks ago, raising concern for possible overdose.   Chest x-ray appears consistent with pneumonia, lactic acid is 4 critically elevated, the patient is developed a fever.  We will cover with broad-spectrum antibiotics, call code sepsis, give IV fluids,, 30 mils per kilogram   Sepsis - Repeat Assessment  Performed at:    1300  Vitals     Blood pressure 128/72, pulse (!) 118, temperature (!) 101.3 F (38.5 C), resp. rate (!) 36, height 1.702 m (5\' 7" ), weight 72.6 kg, SpO2 92 %.  Heart:     Tachycardic  Lungs:    Rales  Capillary Refill:   <2 sec  Peripheral Pulse:   Radial pulse palpable  Skin:     Normal Color       ____________________________________________   FINAL CLINICAL IMPRESSION(S) / ED DIAGNOSES  Final diagnoses:  Severe sepsis (HCC)  Community acquired pneumonia of right lung, unspecified part of lung  Acute respiratory failure with hypoxia (HCC)        Note:  This document was prepared using Dragon voice recognition software and may include unintentional dictation errors.   Jene EveryKinner, Elmer Merwin, MD 2018/11/03 1353

## 2019-03-31 NOTE — ED Notes (Signed)
Pt provided with 2 warm blankets, repositioned in bed at this time.

## 2019-03-31 NOTE — ED Notes (Signed)
Admitting MD paged regarding admitting orders. Dr. Benjie Karvonen reviewed admission orders at this time.

## 2019-03-31 NOTE — Progress Notes (Signed)
CODE SEPSIS - PHARMACY COMMUNICATION  **Broad Spectrum Antibiotics should be administered within 1 hour of Sepsis diagnosis**  Time Code Sepsis Called/Page Received: 12:24  Antibiotics Ordered: Ceftriaxone, Azithromycin  Time of 1st antibiotic administration: Ceftriaxone given at 12:45  Additional action taken by pharmacy:    If necessary, Name of Provider/Nurse Contacted:      Vira Blanco ,PharmD Clinical Pharmacist  06-Apr-2019  1:03 PM

## 2019-03-31 NOTE — ED Notes (Signed)
This RN spoke with RN, RT aware pt's O2 sats 91-92% on most recent vent setting. Per RT notify if patient drops below 90%. Will continue to monitor.

## 2019-03-31 NOTE — ED Notes (Signed)
Pt changed and repositioned in bed, clean linens provided due to patient vomiting. RT assisted this RN in changing patient.

## 2019-03-31 NOTE — ED Triage Notes (Signed)
Pt presents to ED via [facility] with c/o unresponsive. Per EMS pt found unresponsive this AM by family, pt has been taking antidepressants due to death of family member. Per EMS pt unresponsive to pain en route, bilateral 18G AC placed PTA, 800cc's fluid given PTA.   85% on NRB 126/68 125 ST CBG 178 ETCO2 14   EDP at bedside, OPA placed and patient being bagged upon arrival to ED by EDP and RT.

## 2019-03-31 NOTE — Consult Note (Signed)
Pharmacy Antibiotic Note  Sonia Farrell is a 68 y.o. female admitted on Apr 07, 2019 with aspiration pneumonia.  Pharmacy has been consulted for aspiration PNA dosing.  Plan: Unasyn 3g q6h  Height: 5\' 7"  (170.2 cm) Weight: 173 lb 4.5 oz (78.6 kg) IBW/kg (Calculated) : 61.6  Temp (24hrs), Avg:100.7 F (38.2 C), Min:99.7 F (37.6 C), Max:101.8 F (38.8 C)  Recent Labs  Lab 2019-04-07 1112 04-07-19 1138 2019-04-07 1330  WBC 8.2  --   --   CREATININE 1.09*  --   --   LATICACIDVEN  --  4.0* 3.7*    Estimated Creatinine Clearance: 53.3 mL/min (A) (by C-G formula based on SCr of 1.09 mg/dL (H)).    Allergies  Allergen Reactions  . Escitalopram Oxalate Other (See Comments)    MUSCLE SPASMS    Antimicrobials this admission: 9/12 Azithromycin and Ceftriaxone x 1 9/12 Unasyn >>  Dose adjustments this admission: None  Microbiology results: 9/12 BCx: pending 9/12 UCx: pending   9/12 MRSA PCR: NEG  Thank you for allowing pharmacy to be a part of this patient's care.  Lu Duffel, PharmD, BCPS Clinical Pharmacist 2019/04/07 7:18 PM

## 2019-03-31 NOTE — ED Notes (Signed)
This RN and Margreta Journey, RN at bedside, pt's propofol drip adjusted per Dr. Alva Garnet and titrated per order, while this RN and Chrissy, RN at bedside, pt began having large amounts of dark brown vomit. Pt's mouth immediately suction, pt's O2 sats noted to drop to 83%, pt noted to be clenched down on tube. This RN spoke with Dr. Joan Mayans and [facility] for 40mg  bolus of propofol due to patient biting down on tube. RT called to bedside, adjusted patient's FiO2 to 75%. Pt no longer biting tube, sats 95%.

## 2019-03-31 NOTE — H&P (Signed)
[employer] [facility] [place] [facility]   PATIENT NAME: Sonia Farrell    MR#:  [id number]  DATE OF BIRTH:  1951/07/17  DATE OF ADMISSION:  03/30/2019  PRIMARY CARE PHYSICIAN: Jerl Mina, MD   REQUESTING/REFERRING PHYSICIAN:  Dr Cyril Loosen  CHIEF COMPLAINT:   Unresponsive HISTORY OF PRESENT ILLNESS:  Sonia Farrell  is a 68 y.o. female with a known history of hypothyroidism who presents via EMS due to unresponsiveness.  Patient is intubated.  I am unable to obtain HPI from patient obviously.  HPI taken from ER physician.  Apparently patient was found unresponsive this morning by family.  Patient has had depression since her husband passed away a few weeks ago.  Patient has been taking an SSRI.  No report of suicidal ideations.  Patient was intubated due to inability to protect airway.  She is found to have pneumonia and started on antibiotics while in the emergency room.  PAST MEDICAL HISTORY:   Past Medical History:  Diagnosis Date  . Arthritis    fingers  . GERD (gastroesophageal reflux disease)   . Hyperlipidemia   . Hypothyroidism     PAST SURGICAL HISTORY:   Past Surgical History:  Procedure Laterality Date  . CATARACT EXTRACTION W/PHACO Left 12/26/2018   Procedure: CATARACT EXTRACTION PHACO AND INTRAOCULAR LENS PLACEMENT ([place]) LEFT;  Surgeon: Galen Manila, MD;  Location: [facility];  Service: Ophthalmology;  Laterality: Left;  . CATARACT EXTRACTION W/PHACO Right 01/23/2019   Procedure: CATARACT EXTRACTION PHACO AND INTRAOCULAR LENS PLACEMENT ([facility]) RIGHT;  Surgeon: Galen Manila, MD;  Location: [facility];  Service: Ophthalmology;  Laterality: Right;  . COLONOSCOPY      SOCIAL HISTORY:   Social History   Tobacco Use  . Smoking status: Never Smoker  . Smokeless tobacco: Never Used  Substance Use Topics  . Alcohol use: Yes    Alcohol/week: 1.0 standard drinks    Types: 1 Glasses of wine per week    FAMILY HISTORY:   Family  History  Problem Relation Age of Onset  . Breast cancer Neg Hx     DRUG ALLERGIES:   Allergies  Allergen Reactions  . Escitalopram Oxalate Other (See Comments)    MUSCLE SPASMS    REVIEW OF SYSTEMS:   Review of Systems  Unable to perform ROS: Acuity of condition    MEDICATIONS AT HOME:   Prior to Admission medications   Medication Sig Start Date End Date Taking? Authorizing Provider  ALPRAZolam Prudy Feeler) 0.5 MG tablet Take 0.5 mg by mouth as needed for anxiety.   Yes [provider]  levothyroxine (SYNTHROID) 75 MCG tablet Take 75 mcg by mouth daily before breakfast.   Yes [provider]  pantoprazole (PROTONIX) 40 MG tablet Take 40 mg by mouth daily as needed.   Yes [provider]  PARoxetine (PAXIL) 10 MG tablet Take 10 mg by mouth daily.   Yes [provider]  rosuvastatin (CRESTOR) 10 MG tablet Take 10 mg by mouth daily.   Yes [provider]  acetaminophen (TYLENOL) 325 MG tablet Take 650 mg by mouth every 6 (six) hours as needed.    [provider]      VITAL SIGNS:  Blood pressure 123/75, pulse (!) 122, temperature (!) 100.6 F (38.1 C), resp. rate (!) 40, height 5\' 7"  (1.702 m), weight 72.6 kg, SpO2 91 %.  PHYSICAL EXAMINATION:   Physical Exam Constitutional:      Appearance: She is diaphoretic. She is not ill-appearing.  Comments: Intubated sedated on vent  HENT:     Head: Normocephalic.     Nose: Nose normal.     Mouth/Throat:     Pharynx: Oropharyngeal exudate present. No posterior oropharyngeal erythema.  Eyes:     General: Scleral icterus present.        Left eye: No discharge.     Comments: Pupils are 4 mm sluggish  Cardiovascular:     Rate and Rhythm: Tachycardia present.     Pulses: Normal pulses.     Heart sounds: No murmur. Friction rub present. No gallop.   Pulmonary:     Effort: Pulmonary effort is normal.     Breath sounds: Rhonchi present.  Musculoskeletal:     Right lower leg: No  edema.     Left lower leg: No edema.  Lymphadenopathy:     Cervical: No cervical adenopathy.  Skin:    General: Skin is warm.  Neurological:     Comments: Sedated  Psychiatric:     Comments: Sedated       LABORATORY PANEL:   CBC Recent Labs  Lab 04/05/2019 1112  WBC 8.2  HGB 14.7  HCT 44.5  PLT 274   ------------------------------------------------------------------------------------------------------------------  Chemistries  Recent Labs  Lab 04/06/2019 1112  NA 140  K 4.4  CL 107  CO2 21*  GLUCOSE 148*  BUN 18  CREATININE 1.09*  CALCIUM 8.2*  AST 42*  ALT 22  ALKPHOS 70  BILITOT 0.8   ------------------------------------------------------------------------------------------------------------------  Cardiac Enzymes No results for input(s): TROPONINI in the last 168 hours. ------------------------------------------------------------------------------------------------------------------  RADIOLOGY:  Dg Abdomen 1 View  Result Date: 03/30/2019 CLINICAL DATA:  Unresponsive.  NG tube placement EXAM: ABDOMEN - 1 VIEW COMPARISON:  None. FINDINGS: 1149 hours. Prominent gastric bubble noted. NG tube tip is in the mid stomach with proximal port of the NG tube below the EG junction. No gaseous bowel distention within the visualized abdomen. Visualized bony anatomy unremarkable. IMPRESSION: NG tube tip is in the mid stomach. Electronically Signed   By: Misty Stanley M.D.   On: 03/26/2019 12:25   Ct Head Wo Contrast  Result Date: 04/12/2019 CLINICAL DATA:  Altered level of consciousness. EXAM: CT HEAD WITHOUT CONTRAST TECHNIQUE: Contiguous axial images were obtained from the base of the skull through the vertex without intravenous contrast. COMPARISON:  None. FINDINGS: Brain: There is no evidence for acute hemorrhage, hydrocephalus, mass lesion, or abnormal extra-axial fluid collection. No definite CT evidence for acute infarction. Vascular: No hyperdense vessel or unexpected  calcification. Skull: No evidence for fracture. No worrisome lytic or sclerotic lesion. Sinuses/Orbits: The visualized paranasal sinuses and mastoid air cells are clear. Visualized portions of the globes and intraorbital fat are unremarkable. Other: None. IMPRESSION: Unremarkable CT evaluation of the brain. No acute intracranial abnormality. Electronically Signed   By: Misty Stanley M.D.   On: 03/22/2019 12:46   Dg Chest Port 1 View  Result Date: 04/08/2019 CLINICAL DATA:  Status post intubation and OG tube placement. EXAM: PORTABLE CHEST 1 VIEW COMPARISON:  No comparison studies available. FINDINGS: 1147 hours. Endotracheal tube tip is 5.2 cm above the base of the carina. The NG tube passes into the stomach although the distal tip position is not included on the film. Volume loss noted right hemithorax patchy airspace disease noted right upper lobe and to a lesser degree in the right mid lung. Left lung appears hyperexpanded but clear. Interstitial markings are diffusely coarsened with chronic features. The cardiopericardial silhouette is within normal limits for  size. IMPRESSION: Volume loss right hemithorax with right upper and mid lung patchy airspace disease. Endotracheal tube and NG tube as above. Electronically Signed   By: Kennith CenterEric  Mansell M.D.   On: 24-Oct-2018 12:23    EKG:  Sinus tachycardia no ST elevation or depression  IMPRESSION AND PLAN:   68 year old female who was found unresponsive by family members and brought to ER and was quickly intubated due to inability to protect airway.   1.  Acute hypoxic respiratory failure: Patient presented to the emergency room on nonrebreather with 80% saturations.  Patient was immediately intubated. Chest x-ray consistent with right upper and mid lung patchy airspace disease consistent with aspiration pneumonia. Intensivist contacted via epic. Unasyn for aspiration pneumonia with aspiration precautions Further vent management by intensivist.  2.   Sepsis due to aspiration pneumonia: Patient has fever, tachycardia, tachypnea  all consistent with sepsis. Tinea IV fluids and monitor lactic acid level.  3.  Aspiration pneumonia with elevated procalcitonin level: Continue Unasyn for aspiration pneumonia  4.  Unresponsiveness of unclear etiology urine toxicology positive for benzos. CT head negative for acute pathology   All the records are reviewed and case discussed with ED provider.   CODE STATUS: FULL  CRITICAL CARE TOTAL TIME TAKING CARE OF THIS PATIENT: 60 minutes.    Adrian SaranSital Tinesha Siegrist M.D on 24-Feb-2019 at 2:13 PM  Between 7am to 6pm - Pager - [phone]  After 6pm go to [email or web address] - Social research officer, governmentpassword EPAS [email or web address]  [facility]  Office  [phone]  CC: Primary care physician; Jerl MinaHedrick, James, MD

## 2019-03-31 NOTE — ED Notes (Signed)
This RN checked in on patient, patient resting and vital signs WDL. Will continue to monitor.

## 2019-04-01 ENCOUNTER — Inpatient Hospital Stay: Payer: Medicare Other

## 2019-04-01 DIAGNOSIS — R401 Stupor: Secondary | ICD-10-CM

## 2019-04-01 DIAGNOSIS — R569 Unspecified convulsions: Secondary | ICD-10-CM

## 2019-04-01 LAB — CBC
HCT: 38.9 % (ref 36.0–46.0)
Hemoglobin: 12.6 g/dL (ref 12.0–15.0)
MCH: 30.9 pg (ref 26.0–34.0)
MCHC: 32.4 g/dL (ref 30.0–36.0)
MCV: 95.3 fL (ref 80.0–100.0)
Platelets: 203 10*3/uL (ref 150–400)
RBC: 4.08 MIL/uL (ref 3.87–5.11)
RDW: 13.2 % (ref 11.5–15.5)
WBC: 8.6 10*3/uL (ref 4.0–10.5)
nRBC: 0 % (ref 0.0–0.2)

## 2019-04-01 LAB — COMPREHENSIVE METABOLIC PANEL
ALT: 36 U/L (ref 0–44)
AST: 72 U/L — ABNORMAL HIGH (ref 15–41)
Albumin: 2.9 g/dL — ABNORMAL LOW (ref 3.5–5.0)
Alkaline Phosphatase: 52 U/L (ref 38–126)
Anion gap: 11 (ref 5–15)
BUN: 21 mg/dL (ref 8–23)
CO2: 20 mmol/L — ABNORMAL LOW (ref 22–32)
Calcium: 7.7 mg/dL — ABNORMAL LOW (ref 8.9–10.3)
Chloride: 111 mmol/L (ref 98–111)
Creatinine, Ser: 0.92 mg/dL (ref 0.44–1.00)
GFR calc Af Amer: >60 mL/min (ref >60)
GFR calc non Af Amer: >60 mL/min (ref >60)
Glucose, Bld: 121 mg/dL — ABNORMAL HIGH (ref 70–99)
Potassium: 3.8 mmol/L (ref 3.5–5.1)
Sodium: 142 mmol/L (ref 135–145)
Total Bilirubin: 0.7 mg/dL (ref 0.3–1.2)
Total Protein: 5.8 g/dL — ABNORMAL LOW (ref 6.5–8.1)

## 2019-04-01 LAB — GLUCOSE, CAPILLARY
Glucose-Capillary: 107 mg/dL — ABNORMAL HIGH (ref 70–99)
Glucose-Capillary: 105 mg/dL — ABNORMAL HIGH (ref 70–99)

## 2019-04-01 LAB — PROCALCITONIN: Procalcitonin: 14.37 ng/mL

## 2019-04-01 LAB — STREP PNEUMONIAE URINARY ANTIGEN: Strep Pneumo Urinary Antigen: NEGATIVE

## 2019-04-01 LAB — MAGNESIUM: Magnesium: 1.7 mg/dL (ref 1.7–2.4)

## 2019-04-01 LAB — PHOSPHORUS: Phosphorus: 2.3 mg/dL — ABNORMAL LOW (ref 2.5–4.6)

## 2019-04-01 LAB — TRIGLYCERIDES: Triglycerides: 103 mg/dL (ref <150)

## 2019-04-01 IMAGING — MR MR HEAD W/O CM
12 series · 48 of 48 positions shown · non-contrast
Comparison: Head CT [DATE]

CLINICAL DATA: Acute presentation unresponsive.

EXAM:
MRI HEAD WITHOUT CONTRAST
TECHNIQUE: Multiplanar, multiecho pulse sequences of the brain and surrounding
structures were obtained without intravenous contrast.

[Series 2: ax dwi_tracew · axial · 3.0mm · 1.31mm/px · z∈[-30,+113]mm · 5 of 46 slices shown]
[im 1/46]
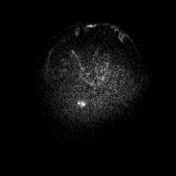
[im 12/46]
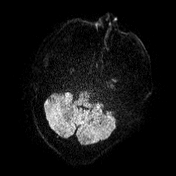
[im 23/46]
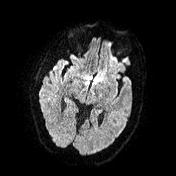
[im 34/46]
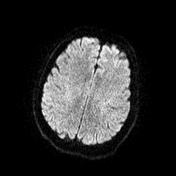
[im 46/46]
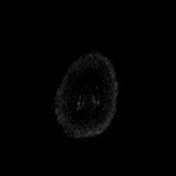

[Series 3: ax dwi_adc · axial · 3.0mm · 1.31mm/px · z∈[-30,+113]mm · 6 of 46 slices shown]
[im 1/46]
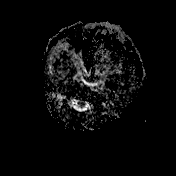
[im 10/46]
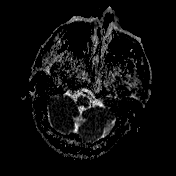
[im 19/46]
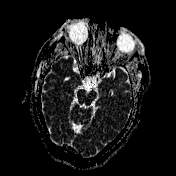
[im 28/46]
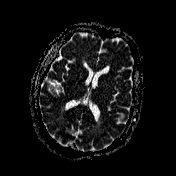
[im 37/46]
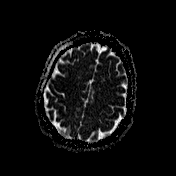
[im 46/46]
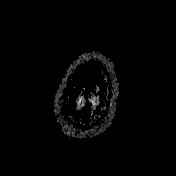

[Series 4: cor dwi_tracew · coronal · 5.0mm · 1.31mm/px · 5 of 36 slices shown]
[im 1/36]
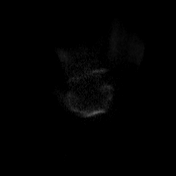
[im 9/36]
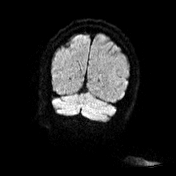
[im 18/36]
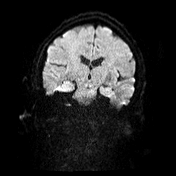
[im 27/36]
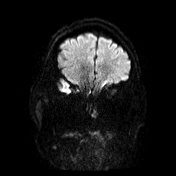
[im 36/36]
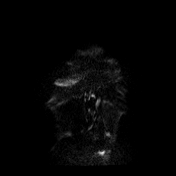

[Series 5: cor dwi_adc · coronal · 5.0mm · 1.31mm/px · 5 of 36 slices shown]
[im 1/36]
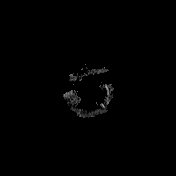
[im 9/36]
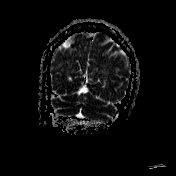
[im 18/36]
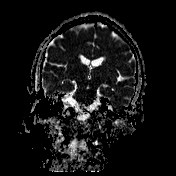
[im 27/36]
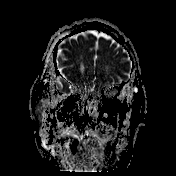
[im 36/36]
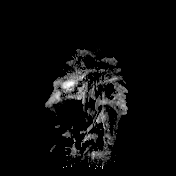

[Series 6: T1 · sagittal · 5.0mm · 0.94mm/px · 3 of 21 slices shown (1 of 2)]
[im 1/21]
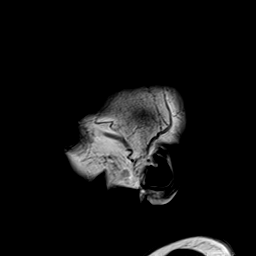
[im 11/21]
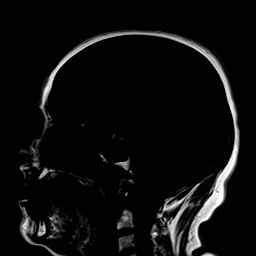
[im 21/21]
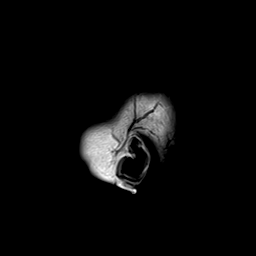

[Series 7: T2 · axial · 5.0mm · 0.45mm/px · z∈[-34,+110]mm · 3 of 26 slices shown (1 of 3)]
[im 1/26]
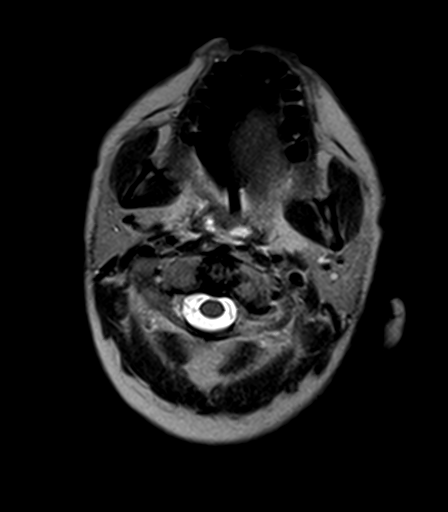
[im 13/26]
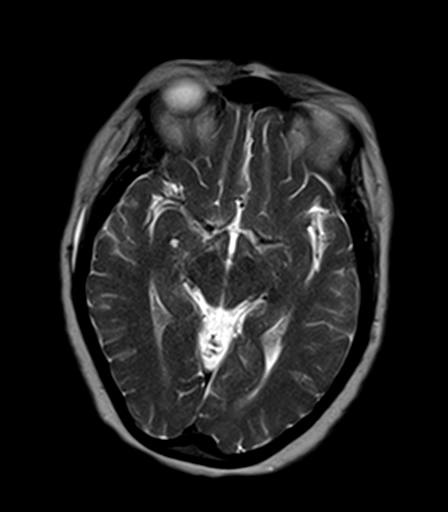
[im 26/26]
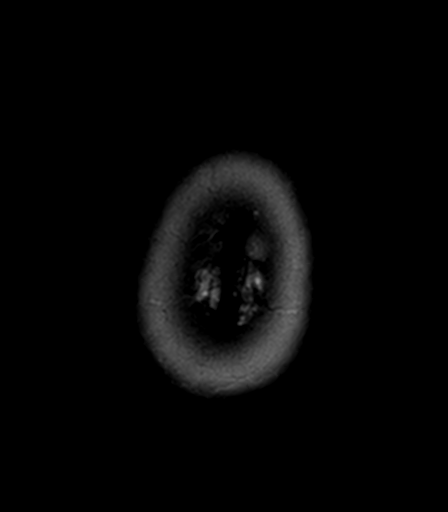

[Series 8: T2-star · axial · 5.0mm · 0.45mm/px · z∈[-34,+110]mm · 3 of 26 slices shown]
[im 1/26]
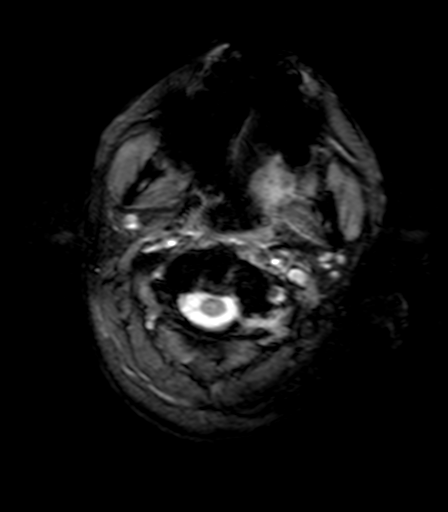
[im 13/26]
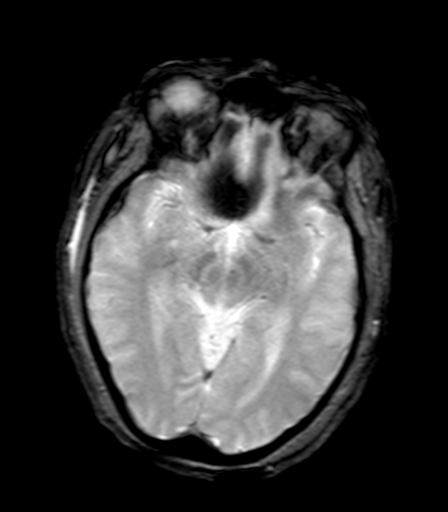
[im 26/26]
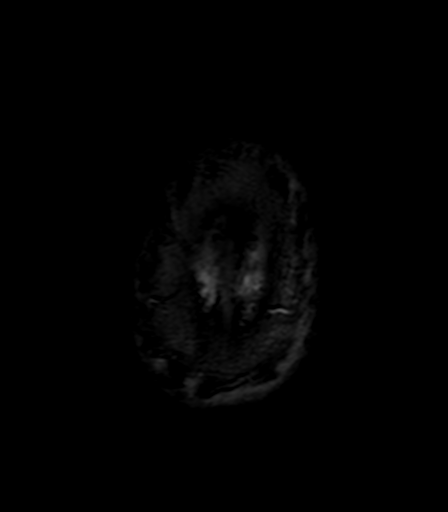

[Series 9: FLAIR · axial · 5.0mm · 1.20mm/px · z∈[-31,+114]mm · 3 of 26 slices shown (1 of 2)]
[im 1/26]
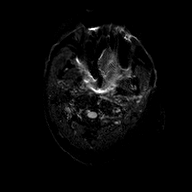
[im 13/26]
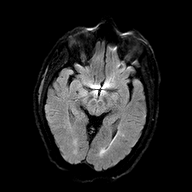
[im 26/26]
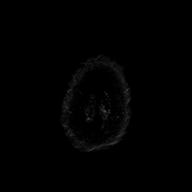

[Series 10: T1 · axial · 5.0mm · 0.90mm/px · z∈[-34,+110]mm · 3 of 26 slices shown (2 of 2)]
[im 1/26]
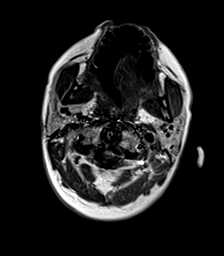
[im 13/26]
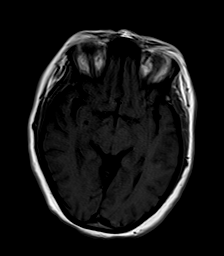
[im 26/26]
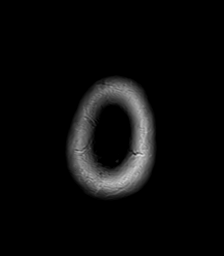

[Series 11: T2 · coronal · 5.0mm · 0.45mm/px · 4 of 31 slices shown (2 of 3)]
[im 1/31]
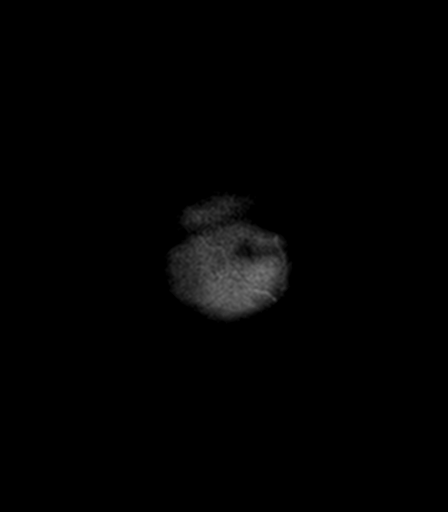
[im 11/31]
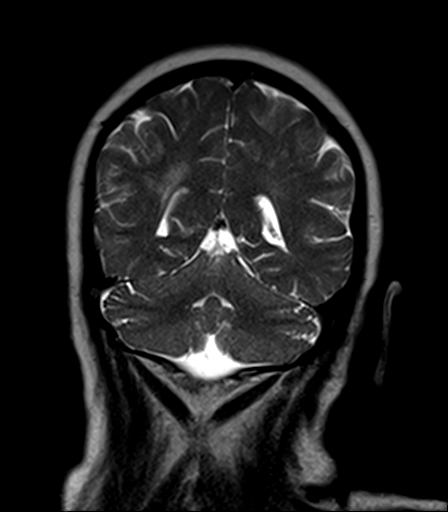
[im 21/31]
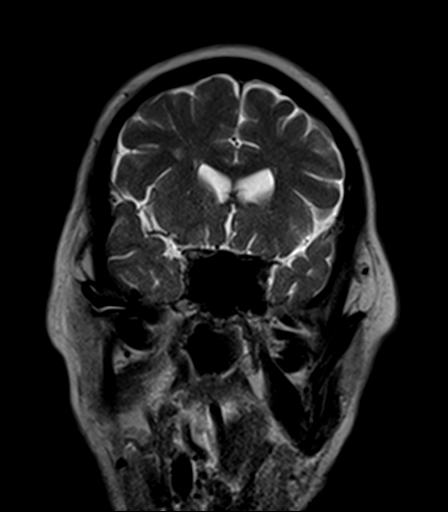
[im 31/31]
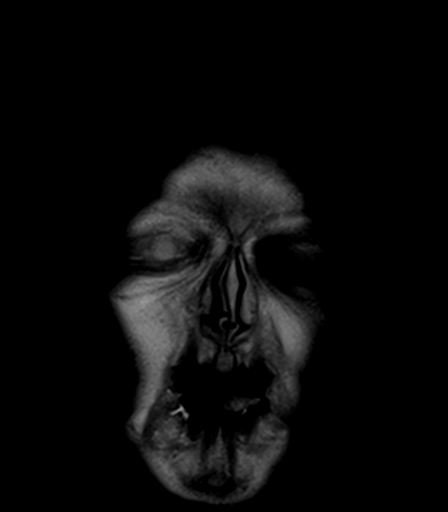

[Series 12: T2 · coronal · 3.0mm · 0.47mm/px · 4 of 30 slices shown (3 of 3)]
[im 1/30]
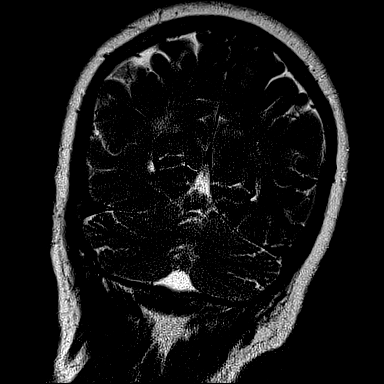
[im 10/30]
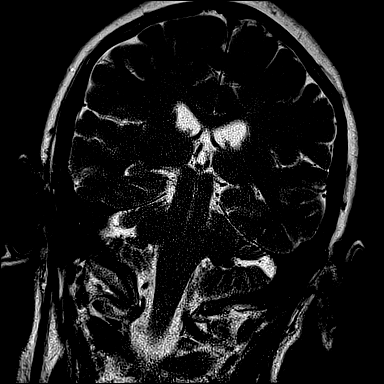
[im 20/30]
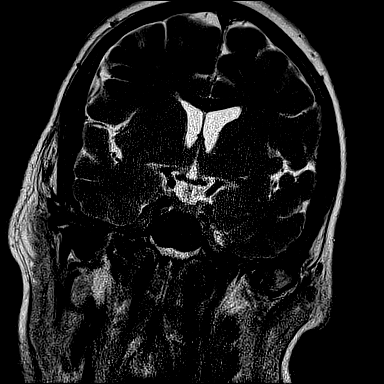
[im 30/30]
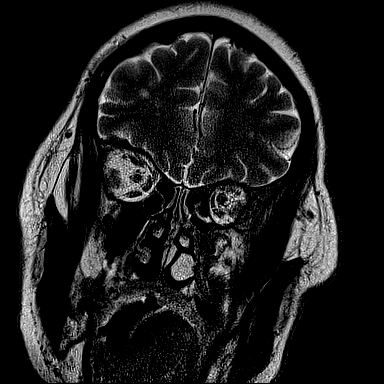

[Series 13: FLAIR · coronal · 3.0mm · 0.47mm/px · 4 of 30 slices shown (2 of 2)]
[im 1/30]
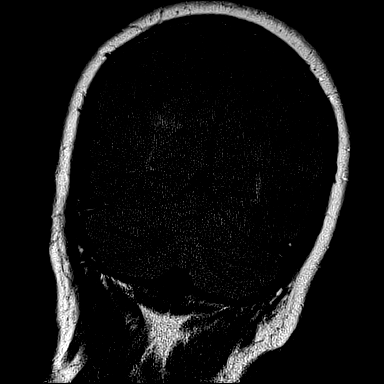
[im 10/30]
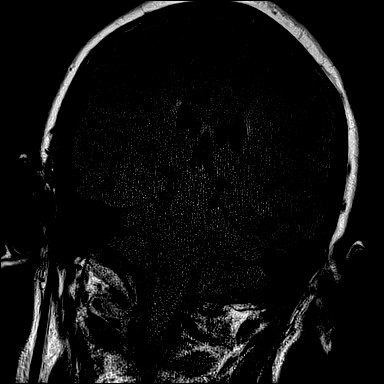
[im 20/30]
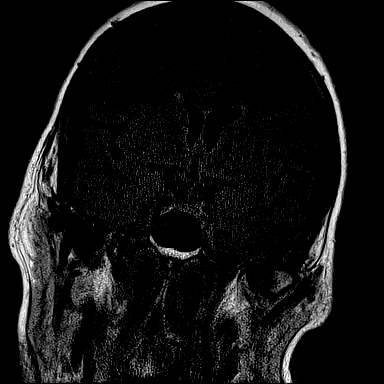
[im 30/30]
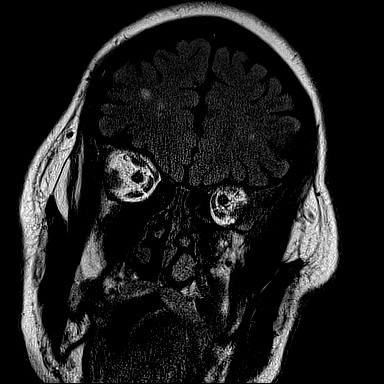

[48 of 48 positions shown; findings below may reference images not displayed]

FINDINGS: Brain: Diffusion imaging does not show any acute or subacute
infarction. Brainstem and cerebellum are normal. Cerebral
hemispheres show moderate changes of chronic small vessel disease
affecting the deep and subcortical white matter. No cortical or
large vessel territory infarction. No mass lesion, hemorrhage,
hydrocephalus or extra-axial collection.

Vascular: Major vessels at the base of the brain show flow.

Skull and upper cervical spine: Negative

Sinuses/Orbits: Clear/normal

Other: None
IMPRESSION: No abnormality seen to explain the clinical presentation. No acute
or subacute infarction or any reversible finding. Moderate chronic
small-vessel ischemic changes the cerebral hemispheric white matter.

## 2019-04-01 IMAGING — DX DG CHEST 1V PORT
1 series · 1 of 1 positions shown · non-contrast
Comparison: Chest x-ray [DATE].

CLINICAL DATA: 68-year-old female with history of respiratory
failure.

EXAM:
PORTABLE CHEST 1 VIEW

[chest ap]
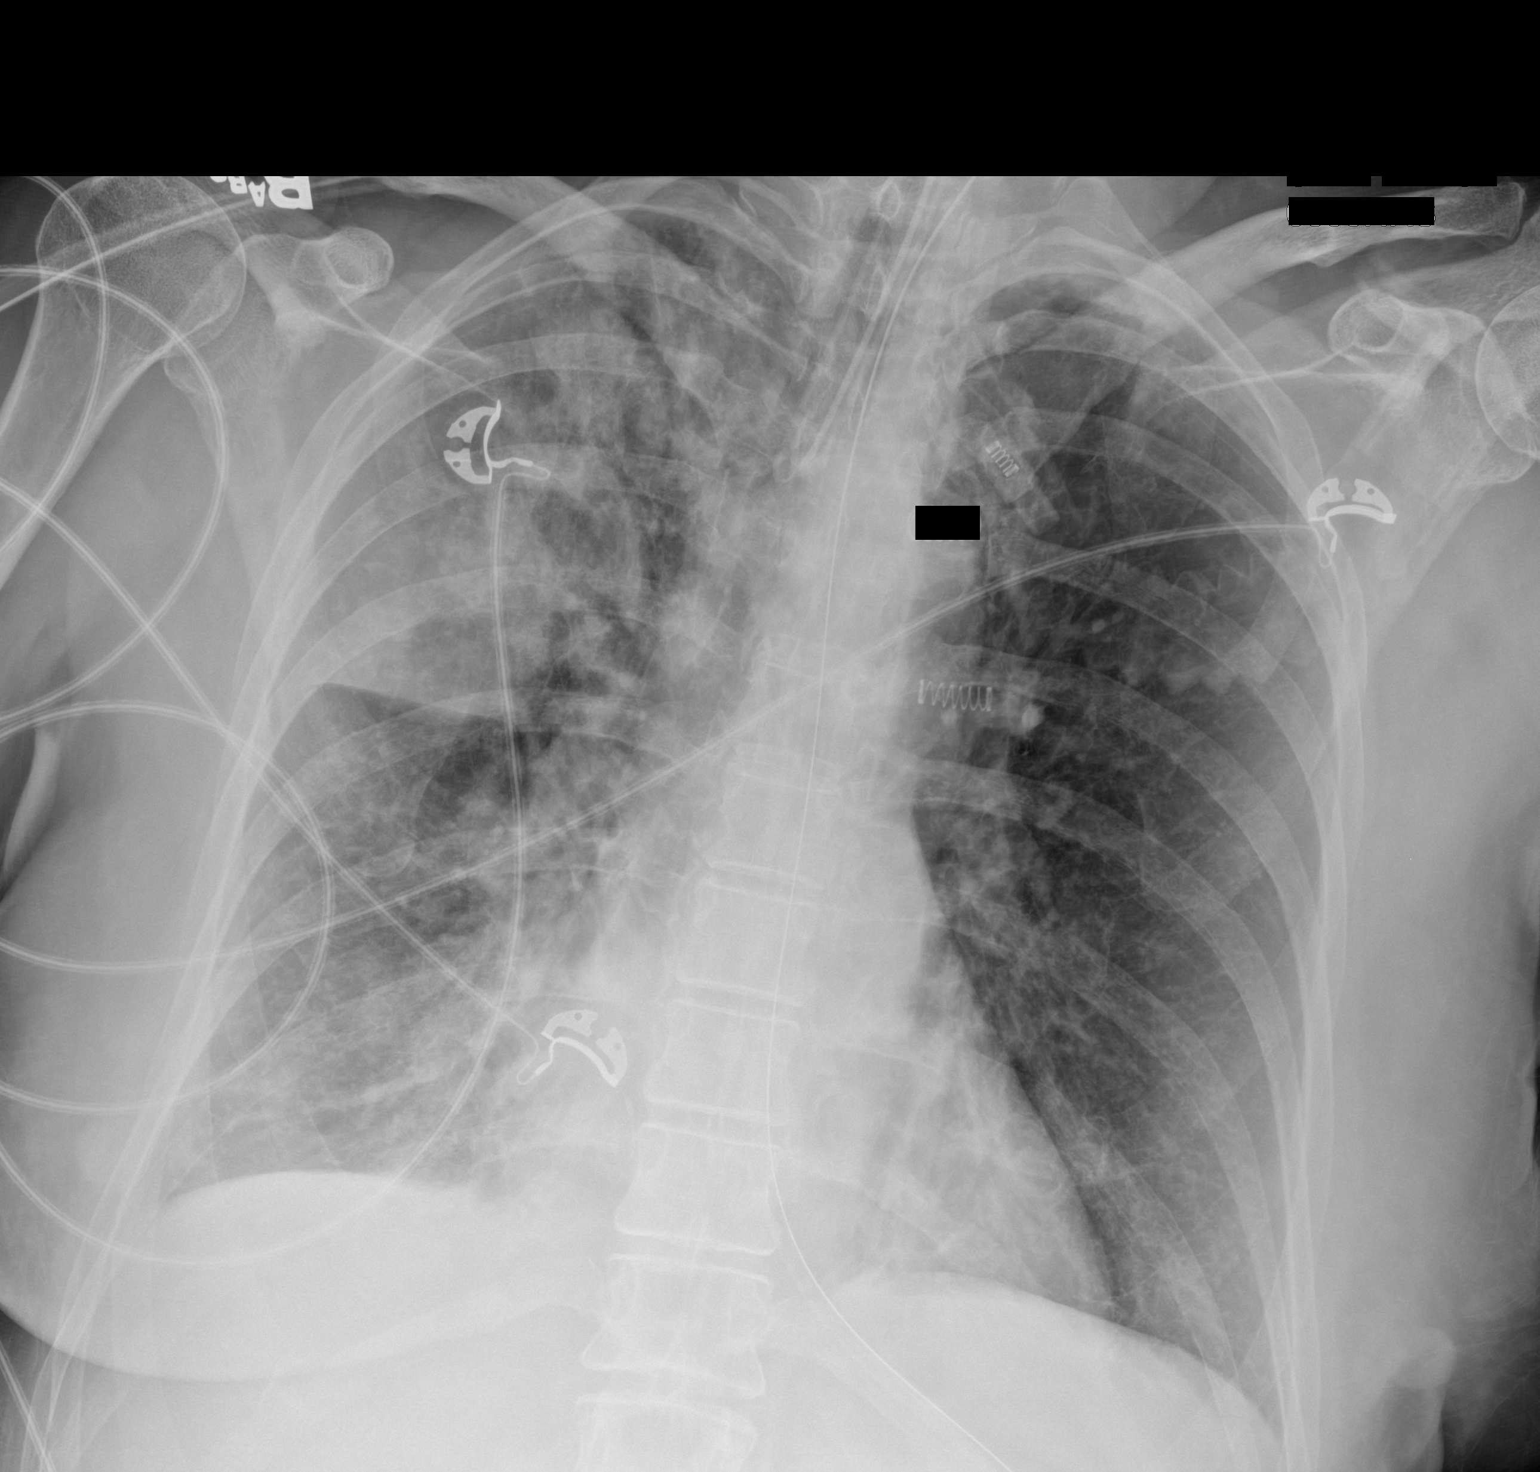

[1 of 1 positions shown; findings below may reference images not displayed]

FINDINGS: An endotracheal tube is in place with tip 5.1 cm above the carina. A
nasogastric tube is seen extending into the stomach, however, the
tip of the nasogastric tube extends below the lower margin of the
image. Patchy multifocal interstitial and airspace disease noted
throughout the right lung, most confluent in the right upper lobe,
with slightly worsened aeration compared to yesterday's examination.
Left lung is clear. No pleural effusions. No evidence of pulmonary
edema. Heart size is normal. Upper mediastinal contours are within
normal limits.
IMPRESSION: 1. Severe multilobar pneumonia in the right lung, most pronounced in
the right upper lobe, with worsening aeration compared to
yesterday's examination.
2. Support apparatus, as above.

## 2019-04-01 MED ORDER — ENOXAPARIN SODIUM 40 MG/0.4ML ~~LOC~~ SOLN
40.0000 mg | Freq: Every day | SUBCUTANEOUS | Status: DC
  Administered 2019-04-01 – 2019-04-11 (×11): 40 mg via SUBCUTANEOUS
  Filled 2019-04-01 (×11): qty 0.4

## 2019-04-01 MED ORDER — VITAL 1.5 CAL PO LIQD
1000.0000 mL | ORAL | Status: DC
  Administered 2019-04-04: 02:00:00 1000 mL

## 2019-04-01 MED ORDER — FENTANYL CITRATE (PF) 100 MCG/2ML IJ SOLN
25.0000 ug | INTRAMUSCULAR | Status: DC | PRN
  Administered 2019-04-04 (×2): 25 ug via INTRAVENOUS
  Administered 2019-04-06: 50 ug via INTRAVENOUS
  Filled 2019-04-01 (×2): qty 2

## 2019-04-01 MED ORDER — MIDAZOLAM HCL 2 MG/2ML IJ SOLN
1.0000 mg | INTRAMUSCULAR | Status: DC | PRN
  Administered 2019-04-04 – 2019-04-09 (×11): 1 mg via INTRAVENOUS
  Filled 2019-04-01 (×14): qty 2

## 2019-04-01 MED ORDER — LEVETIRACETAM IN NACL 1000 MG/100ML IV SOLN
1000.0000 mg | Freq: Two times a day (BID) | INTRAVENOUS | Status: AC
  Administered 2019-04-01: 1000 mg via INTRAVENOUS
  Filled 2019-04-01 (×2): qty 100

## 2019-04-01 MED ORDER — PRO-STAT SUGAR FREE PO LIQD
30.0000 mL | Freq: Two times a day (BID) | ORAL | Status: DC
  Administered 2019-04-01 – 2019-04-09 (×16): 30 mL

## 2019-04-01 MED ORDER — FREE WATER
200.0000 mL | Freq: Three times a day (TID) | Status: DC
  Administered 2019-04-01 – 2019-04-02 (×2): 200 mL

## 2019-04-01 MED ORDER — LORAZEPAM 2 MG/ML IJ SOLN
INTRAMUSCULAR | Status: AC
  Filled 2019-04-01: qty 1

## 2019-04-01 MED ORDER — LEVETIRACETAM IN NACL 500 MG/100ML IV SOLN
500.0000 mg | Freq: Two times a day (BID) | INTRAVENOUS | Status: DC
  Administered 2019-04-01: 500 mg via INTRAVENOUS
  Filled 2019-04-01 (×3): qty 100

## 2019-04-01 MED ORDER — FAMOTIDINE 20 MG PO TABS
20.0000 mg | ORAL_TABLET | Freq: Every day | ORAL | Status: DC
  Administered 2019-04-01 – 2019-04-08 (×8): 20 mg
  Filled 2019-04-01 (×8): qty 1

## 2019-04-01 MED ORDER — POLYETHYLENE GLYCOL 3350 17 G PO PACK: 17.0000 g | PACK | Freq: Every day | ORAL | Status: DC | PRN

## 2019-04-01 MED ORDER — VITAL HIGH PROTEIN PO LIQD: 1000.0000 mL | ORAL | Status: DC

## 2019-04-01 NOTE — Consult Note (Signed)
Pharmacy Antibiotic Note  Sonia Farrell is a 68 y.o. female admitted on Apr 07, 2019 with aspiration pneumonia.  Pharmacy has been consulted for Unasyn dosing. Patient is currently intubated.   Plan: Continue Unasyn 3g q6h  Height: 5\' 7"  (170.2 cm) Weight: 173 lb 4.5 oz (78.6 kg) IBW/kg (Calculated) : 61.6  Temp (24hrs), Avg:100 F (37.8 C), Min:98.6 F (37 C), Max:101.1 F (38.4 C)  Recent Labs  Lab 2019/04/07 1112 2019/04/07 1138 Apr 07, 2019 1330 04/01/19 0547  WBC 8.2  --   --  8.6  CREATININE 1.09*  --   --  0.92  LATICACIDVEN  --  4.0* 3.7*  --     Estimated Creatinine Clearance: 63.2 mL/min (by C-G formula based on SCr of 0.92 mg/dL).    Allergies  Allergen Reactions  . Escitalopram Oxalate Other (See Comments)    MUSCLE SPASMS    Antimicrobials this admission: 9/12 Azithromycin and Ceftriaxone x 1 9/12 Unasyn >>  Dose adjustments this admission: None  Microbiology results: 9/12 BCx: no growth < 24 hours  9/12 UCx: pending  9/12 MRSA PCR: negative  9/12 COVID: negative   Thank you for allowing pharmacy to be a part of this patient's care.  Aliayah Tyer L, RPh 04/01/2019 2:57 PM

## 2019-04-01 NOTE — Consult Note (Signed)
Reason for Consult:Possible seizure Referring Physician: Alva Garnet  CC: Possible seizure  HPI: Sonia Farrell is an 68 y.o. female who is intubated and unable to provide any history therefore all history obtained from the chart.  Patient with a known history of hypothyroidism who presents via EMS due to unresponsiveness.  Apparently patient was found unresponsive this morning by family.  Patient has had depression since her husband passed away a few weeks ago.  Patient has been taking an SSRI.  No report of suicidal ideations.  Patient was intubated due to inability to protect airway.  She is found to have pneumonia and started on antibiotics while in the emergency room.  Today noted to have eyes deviated upward with some unususal eye movements at times concerning for seizure.    Past Medical History:  Diagnosis Date  . Arthritis    fingers  . GERD (gastroesophageal reflux disease)   . Hyperlipidemia   . Hypothyroidism     Past Surgical History:  Procedure Laterality Date  . CATARACT EXTRACTION W/PHACO Left 12/26/2018   Procedure: CATARACT EXTRACTION PHACO AND INTRAOCULAR LENS PLACEMENT ([place]) LEFT;  Surgeon: Birder Robson, MD;  Location: [facility];  Service: Ophthalmology;  Laterality: Left;  . CATARACT EXTRACTION W/PHACO Right 01/23/2019   Procedure: CATARACT EXTRACTION PHACO AND INTRAOCULAR LENS PLACEMENT ([place]) RIGHT;  Surgeon: Birder Robson, MD;  Location: [facility];  Service: Ophthalmology;  Laterality: Right;  . COLONOSCOPY      Family History  Problem Relation Age of Onset  . Breast cancer Neg Hx     Social History:  reports that she has never smoked. She has never used smokeless tobacco. She reports current alcohol use of about 1.0 standard drinks of alcohol per week. No history on file for drug.  Allergies  Allergen Reactions  . Escitalopram Oxalate Other (See Comments)    MUSCLE SPASMS    Medications:  I have reviewed the patient's current  medications. Prior to Admission:  Medications Prior to Admission  Medication Sig Dispense Refill Last Dose  . ALPRAZolam (XANAX) 0.5 MG tablet Take 0.5 mg by mouth as needed for anxiety.   Past Week at Unknown time  . levothyroxine (SYNTHROID) 75 MCG tablet Take 75 mcg by mouth daily before breakfast.   Past Week at Unknown time  . pantoprazole (PROTONIX) 40 MG tablet Take 40 mg by mouth daily as needed.   Past Week at Unknown time  . PARoxetine (PAXIL) 10 MG tablet Take 10 mg by mouth daily.   Past Week at Unknown time  . rosuvastatin (CRESTOR) 10 MG tablet Take 10 mg by mouth daily.   Past Week at Unknown time  . acetaminophen (TYLENOL) 325 MG tablet Take 650 mg by mouth every 6 (six) hours as needed.   prn at prn   Scheduled: . chlorhexidine gluconate (MEDLINE KIT)  15 mL Mouth Rinse BID  . Chlorhexidine Gluconate Cloth  6 each Topical Daily  . enoxaparin (LOVENOX) injection  40 mg Subcutaneous Q24H  . hydrocortisone sod succinate (SOLU-CORTEF) inj  50 mg Intravenous Q8H  . LORazepam      . mouth rinse  15 mL Mouth Rinse 10 times per day    ROS: Unable to provide  Physical Examination: Blood pressure 124/67, pulse 99, temperature 99.3 F (37.4 C), resp. rate (!) 25, height _0  (1.702 m), weight 78.6 kg, SpO2 94 %.  HEENT-  Normocephalic, no lesions, without obvious abnormality.  Normal external eye and conjunctiva.  Normal TM's bilaterally.  Normal auditory canals and external ears. Normal external nose, mucus membranes and septum.  Normal pharynx. Cardiovascular- S1, S2 normal, pulses palpable throughout   Lungs- chest clear, no wheezing, rales, normal symmetric air entry Abdomen- soft, non-tender; bowel sounds normal; no masses,  no organomegaly Extremities- no edema Lymph-no adenopathy palpable Musculoskeletal-no joint tenderness, deformity or swelling Skin-warm and dry, no hyperpigmentation, vitiligo, or suspicious lesions  Neurological Examination   Mental  Status: Patient does not respond to verbal stimuli.  Grimaces to deep sternal rub.  Does not follow commands.  No verbalizations noted.  Cranial Nerves: II: patient does not respond confrontation bilaterally, pupils right 3 mm, left 3 mm,and reactive bilaterally III,IV,VI: Oculocephalic response present bilaterally.  V,VII: corneal reflex present bilaterally  VIII: patient does not respond to verbal stimuli IX,X: gag reflex unable to be tested, XI: trapezius strength unable to test bilaterally XII: tongue strength unable to test Motor: Extremities flaccid throughout.  No spontaneous movement noted.  No purposeful movements noted. Sensory: Grimaces with painful stimuli in all extremities.  Some withdrawal noted in the lower extremities with painful stimuli Deep Tendon Reflexes:  3+ throughout. Plantars: Mute bilaterally Cerebellar: Unable to perform   Laboratory Studies:   Basic Metabolic Panel: Recent Labs  Lab 04/07/2019 1112 04/01/19 0547  NA 140 142  K 4.4 3.8  CL 107 111  CO2 21* 20*  GLUCOSE 148* 121*  BUN 18 21  CREATININE 1.09* 0.92  CALCIUM 8.2* 7.7*  MG  --  1.7  PHOS  --  2.3*    Liver Function Tests: Recent Labs  Lab 03/28/2019 1112 04/01/19 0547  AST 42* 72*  ALT 22 36  ALKPHOS 70 52  BILITOT 0.8 0.7  PROT 7.4 5.8*  ALBUMIN 4.0 2.9*   No results for input(s): LIPASE, AMYLASE in the last 168 hours. No results for input(s): AMMONIA in the last 168 hours.  CBC: Recent Labs  Lab 04/09/2019 1112 04/01/19 0547  WBC 8.2 8.6  NEUTROABS 7.1  --   HGB 14.7 12.6  HCT 44.5 38.9  MCV 93.9 95.3  PLT 274 203    Cardiac Enzymes: No results for input(s): CKTOTAL, CKMB, CKMBINDEX, TROPONINI in the last 168 hours.  BNP: Invalid input(s): POCBNP  CBG: Recent Labs  Lab 03/26/2019 1744 04/08/2019 2356  GLUCAP 125* 114*    Microbiology: Results for orders placed or performed during the hospital encounter of 03/25/2019  Blood Cultures (routine x 2)      Status: None (Preliminary result)   Collection Time: 03/27/2019 11:38 AM   Specimen: BLOOD  Result Value Ref Range Status   Specimen Description BLOOD BLOOD RIGHT HAND  Final   Special Requests   Final    BOTTLES DRAWN AEROBIC AND ANAEROBIC Blood Culture results may not be optimal due to an excessive volume of blood received in culture bottles   Culture   Final    NO GROWTH < 24 HOURS Performed at [facility], [address] [place], [place] [address]    Report Status PENDING  Incomplete  SARS Coronavirus 2 [facility] order, Performed in [place] hospital lab) Nasopharyngeal     Status: None   Collection Time: 03/30/2019 11:38 AM   Specimen: Nasopharyngeal  Result Value Ref Range Status   SARS Coronavirus 2 NEGATIVE NEGATIVE Final    Comment: (NOTE) If result is NEGATIVE SARS-CoV-2 target nucleic acids are NOT DETECTED. The SARS-CoV-2 RNA is generally detectable in upper and lower  respiratory specimens during the acute phase of  infection. The lowest  concentration of SARS-CoV-2 viral copies this assay can detect is 250  copies / mL. A negative result does not preclude SARS-CoV-2 infection  and should not be used as the sole basis for treatment or other  patient management decisions.  A negative result may occur with  improper specimen collection / handling, submission of specimen other  than nasopharyngeal swab, presence of viral mutation(s) within the  areas targeted by this assay, and inadequate number of viral copies  (<250 copies / mL). A negative result must be combined with clinical  observations, patient history, and epidemiological information. If result is POSITIVE SARS-CoV-2 target nucleic acids are DETECTED. The SARS-CoV-2 RNA is generally detectable in upper and lower  respiratory specimens dur ing the acute phase of infection.  Positive  results are indicative of active infection with SARS-CoV-2.  Clinical  correlation with patient history and other  diagnostic information is  necessary to determine patient infection status.  Positive results do  not rule out bacterial infection or co-infection with other viruses. If result is PRESUMPTIVE POSTIVE SARS-CoV-2 nucleic acids MAY BE PRESENT.   A presumptive positive result was obtained on the submitted specimen  and confirmed on repeat testing.  While 2019 novel coronavirus  (SARS-CoV-2) nucleic acids may be present in the submitted sample  additional confirmatory testing may be necessary for epidemiological  and / or clinical management purposes  to differentiate between  SARS-CoV-2 and other Sarbecovirus currently known to infect humans.  If clinically indicated additional testing with an alternate test  methodology [phone]) is advised. The SARS-CoV-2 RNA is generally  detectable in upper and lower respiratory sp ecimens during the acute  phase of infection. The expected result is Negative. Fact Sheet for Patients:  StrictlyIdeas.no Fact Sheet for Healthcare Providers: BankingDealers.co.za This test is not yet approved or cleared by the [place] FDA and has been authorized for detection and/or diagnosis of SARS-CoV-2 by FDA under an Emergency Use Authorization (EUA).  This EUA will remain in effect (meaning this test can be used) for the duration of the COVID-19 declaration under Section 564(b)(1) of the Act, 21 U.S.C. section 360bbb-3(b)(1), unless the authorization is terminated or revoked sooner. Performed at [facility], [address] [place], [place] [address]   Blood Cultures (routine x 2)     Status: None (Preliminary result)   Collection Time: 03/30/2019 11:39 AM   Specimen: BLOOD  Result Value Ref Range Status   Specimen Description BLOOD BLOOD RIGHT FOREARM  Final   Special Requests   Final    BOTTLES DRAWN AEROBIC AND ANAEROBIC Blood Culture adequate volume   Culture   Final    NO GROWTH < 24  HOURS Performed at [facility], [address], [place], [place] [address]    Report Status PENDING  Incomplete  MRSA PCR Screening     Status: None   Collection Time: 04/10/2019  5:46 PM   Specimen: Nasopharyngeal  Result Value Ref Range Status   MRSA by PCR NEGATIVE NEGATIVE Final    Comment:        The GeneXpert MRSA Assay (FDA approved for NASAL specimens only), is one component of a comprehensive MRSA colonization surveillance program. It is not intended to diagnose MRSA infection nor to guide or monitor treatment for MRSA infections. Performed at [facility], [address] [place], [place] [address]     Coagulation Studies: Recent Labs    04/16/2019 1112  LABPROT 12.8  INR 1.0  Urinalysis:  Recent Labs  Lab 03/27/2019 1112  COLORURINE YELLOW*  LABSPEC 1.012  PHURINE 5.0  GLUCOSEU NEGATIVE  HGBUR LARGE*  BILIRUBINUR NEGATIVE  KETONESUR NEGATIVE  PROTEINUR NEGATIVE  NITRITE NEGATIVE  LEUKOCYTESUR NEGATIVE    Lipid Panel:     Component Value Date/Time   TRIG 103 04/01/2019 0547    HgbA1C: No results found for: HGBA1C  Urine Drug Screen:      Component Value Date/Time   LABOPIA NONE DETECTED 04/01/2019 1112   COCAINSCRNUR NONE DETECTED 04/17/2019 1112   LABBENZ POSITIVE (A) 03/20/2019 1112   AMPHETMU NONE DETECTED 03/27/2019 1112   THCU NONE DETECTED 04/08/2019 1112   LABBARB NONE DETECTED 04/10/2019 1112    Alcohol Level:  Recent Labs  Lab 03/26/2019 1138  ETH <10    Other results: EKG: sinus tachycardia at 120 bpm.  Imaging: Dg Abdomen 1 View  Result Date: 03/23/2019 CLINICAL DATA:  Unresponsive.  NG tube placement EXAM: ABDOMEN - 1 VIEW COMPARISON:  None. FINDINGS: 1149 hours. Prominent gastric bubble noted. NG tube tip is in the mid stomach with proximal port of the NG tube below the EG junction. No gaseous bowel distention within the visualized abdomen. Visualized bony anatomy unremarkable. IMPRESSION: NG tube tip  is in the mid stomach. Electronically Signed   By: Misty Stanley M.D.   On: 04/03/2019 12:25   Ct Head Wo Contrast  Result Date: 03/27/2019 CLINICAL DATA:  Altered level of consciousness. EXAM: CT HEAD WITHOUT CONTRAST TECHNIQUE: Contiguous axial images were obtained from the base of the skull through the vertex without intravenous contrast. COMPARISON:  None. FINDINGS: Brain: There is no evidence for acute hemorrhage, hydrocephalus, mass lesion, or abnormal extra-axial fluid collection. No definite CT evidence for acute infarction. Vascular: No hyperdense vessel or unexpected calcification. Skull: No evidence for fracture. No worrisome lytic or sclerotic lesion. Sinuses/Orbits: The visualized paranasal sinuses and mastoid air cells are clear. Visualized portions of the globes and intraorbital fat are unremarkable. Other: None. IMPRESSION: Unremarkable CT evaluation of the brain. No acute intracranial abnormality. Electronically Signed   By: Misty Stanley M.D.   On: 03/20/2019 12:46   Dg Chest Port 1 View  Result Date: 04/01/2019 CLINICAL DATA:  68 year old female with history of respiratory failure. EXAM: PORTABLE CHEST 1 VIEW COMPARISON:  Chest x-ray 03/29/2019. FINDINGS: An endotracheal tube is in place with tip 5.1 cm above the carina. A nasogastric tube is seen extending into the stomach, however, the tip of the nasogastric tube extends below the lower margin of the image. Patchy multifocal interstitial and airspace disease noted throughout the right lung, most confluent in the right upper lobe, with slightly worsened aeration compared to yesterday's examination. Left lung is clear. No pleural effusions. No evidence of pulmonary edema. Heart size is normal. Upper mediastinal contours are within normal limits. IMPRESSION: 1. Severe multilobar pneumonia in the right lung, most pronounced in the right upper lobe, with worsening aeration compared to yesterday's examination. 2. Support apparatus, as above.  Electronically Signed   By: Vinnie Langton M.D.   On: 04/01/2019 08:03   Dg Chest Port 1 View  Result Date: 03/30/2019 CLINICAL DATA:  Status post intubation and OG tube placement. EXAM: PORTABLE CHEST 1 VIEW COMPARISON:  No comparison studies available. FINDINGS: 1147 hours. Endotracheal tube tip is 5.2 cm above the base of the carina. The NG tube passes into the stomach although the distal tip position is not included on the film. Volume loss noted right hemithorax patchy airspace disease  noted right upper lobe and to a lesser degree in the right mid lung. Left lung appears hyperexpanded but clear. Interstitial markings are diffusely coarsened with chronic features. The cardiopericardial silhouette is within normal limits for size. IMPRESSION: Volume loss right hemithorax with right upper and mid lung patchy airspace disease. Endotracheal tube and NG tube as above. Electronically Signed   By: Misty Stanley M.D.   On: 03/29/2019 12:23     Assessment/Plan: 68 year old female presenting after being found down.  Now intubated.  Eye movements noted today.  Some concern for seizure activity.  Head CT on admission shows no acute changes.   Recommendations: 1. Keppra 1085m IV now with maintenance of 5050mIV q 12 hours 2. EEG 3. MRI of the brain without contrast 4. Seizure precautions   LeAlexis GoodellMD Neurology [phone]/13/2020, 10:46 AM

## 2019-04-01 NOTE — Progress Notes (Addendum)
Initial Nutrition Assessment  DOCUMENTATION CODES:   Not applicable  INTERVENTION:  Initiate Vital 1.5 Cal at 45 mL/hr (1080 mL goal daily volume) + Pro-Stat 30 mL BID per tube. Provides 1820 kcal, 103 grams of protein, 821 mL H2O daily.  Provide minimum free water flush of 30 mL Q4hrs to maintain tube patency.  Goal TF regimen meets 100% RDIs for vitamins/minerals.  NUTRITION DIAGNOSIS:   Inadequate oral intake related to inability to eat as evidenced by NPO status.  GOAL:   Provide needs based on ASPEN/SCCM guidelines  MONITOR:   Vent status, Weight trends, Labs, TF tolerance, I & O's  REASON FOR ASSESSMENT:   Ventilator, Consult Enteral/tube feeding initiation and management  ASSESSMENT:   68 year old female with PMHx of hypothyroidism, GERD, arthritis, HLD admitted with decreased LOC and acute hypoxemic respiratory failure requiring intubation on 9/12, with PNA and sepsis.   Patient intubated. Not currently on any continuous sedation. On PRVC mode with FiO2 35% and PEEP 5 cmH2O. Abdomen soft today. Last BM unknown/PTA. Skin is intact. Limited weight history in chart to trend. Patient is currently 78.6 kg (173.28 lbs).  Enteral Access: 18 Fr. OGT placed 9/12; terminates in stomach per abdominal x-ray 9/12; 68 cm at corner of mouth  MAP: 72-83 mmHg  Patient is currently intubated on ventilator support Ve: 9.4 L/min Temp (24hrs), Avg:100.3 F (37.9 C), Min:98.6 F (37 C), Max:101.8 F (38.8 C)  Propofol: N/A  Medications reviewed and include: Solu-Cortef 50 mg Q8hrs IV, Unasyn, famotidine, LR at 100 mL/hr, Keppra, phenylephrine gtt now off, propofol gtt now off.  Labs reviewed: CBG 114-125, CO2 20, Phosphorus 2.3.  I/O: 800 mL UOP yesterday  Discussed with RN.  NUTRITION - FOCUSED PHYSICAL EXAM:    Most Recent Value  Orbital Region  No depletion  Upper Arm Region  No depletion  Thoracic and Lumbar Region  No depletion  Buccal Region  Unable to assess   Temple Region  No depletion  Clavicle Bone Region  No depletion  Clavicle and Acromion Bone Region  No depletion  Scapular Bone Region  Unable to assess  Dorsal Hand  No depletion  Patellar Region  No depletion  Anterior Thigh Region  No depletion  Posterior Calf Region  No depletion  Edema (RD Assessment)  None  Hair  Reviewed  Eyes  Unable to assess  Mouth  Unable to assess  Skin  Reviewed  Nails  Reviewed     Diet Order:   Diet Order    None     EDUCATION NEEDS:   No education needs have been identified at this time  Skin:  Skin Assessment: Reviewed RN Assessment  Last BM:  Unknown/PTA  Height:   Ht Readings from Last 1 Encounters:  04-01-2019 5\' 7"  (1.702 m)   Weight:   Wt Readings from Last 1 Encounters:  04/01/2019 78.6 kg   Ideal Body Weight:  61.4 kg  BMI:  Body mass index is 27.14 kg/m.  Estimated Nutritional Needs:   Kcal:  1610 (PSU 2003b w/ MSJ 1353, Ve 9.4, Tmax 38.8)  Protein:  94-118 grams (1.2-1.5 grams/kg)  Fluid:  2-2.4 L/day  Willey Blade, MS, RD, LDN Office: [phone] Pager: [phone] After Hours/Weekend Pager: [phone]

## 2019-04-01 NOTE — Progress Notes (Signed)
[facility] NAME: Sonia Farrell    MR#:  [id number]  DATE OF BIRTH:  1951/04/15  SUBJECTIVE:   Remains intubated this morning. No acute events overnight.  REVIEW OF SYSTEMS:  ROS- unable to obtain due to intubation and sedation  DRUG ALLERGIES:   Allergies  Allergen Reactions  . Escitalopram Oxalate Other (See Comments)    MUSCLE SPASMS   VITALS:  Blood pressure 129/64, pulse (!) 102, temperature 99.7 F (37.6 C), resp. rate (!) 27, height 5\' 7"  (1.702 m), weight 78.6 kg, SpO2 93 %. PHYSICAL EXAMINATION:  Physical Exam  GENERAL:  Laying in the bed with no acute distress.  HEENT: Head atraumatic, normocephalic. Pupils equal, round, reactive to light and accommodation. No scleral icterus. Extraocular muscles intact. Oropharynx and nasopharynx clear. ETT in place NECK:  Supple, no jugular venous distention. No thyroid enlargement. LUNGS: +diminished breath sounds throughout all lung fields. No wheezes, crackles, rhonchi. No use of accessory muscles of respiration.  CARDIOVASCULAR: RRR, S1, S2 normal. No murmurs, rubs, or gallops.  ABDOMEN: Soft, nontender, nondistended. Bowel sounds present.  EXTREMITIES: No pedal edema, cyanosis, or clubbing.  NEUROLOGIC: intubated and sedated  PSYCHIATRIC: intubated and sedated SKIN: No obvious rash, lesion, or ulcer.  LABORATORY PANEL:  Female CBC Recent Labs  Lab 04/01/19 0547  WBC 8.6  HGB 12.6  HCT 38.9  PLT 203   ------------------------------------------------------------------------------------------------------------------ Chemistries  Recent Labs  Lab 04/01/19 0547  NA 142  K 3.8  CL 111  CO2 20*  GLUCOSE 121*  BUN 21  CREATININE 0.92  CALCIUM 7.7*  MG 1.7  AST 72*  ALT 36  ALKPHOS 52  BILITOT 0.7   RADIOLOGY:  Dg Chest Port 1 View  Result Date: 04/01/2019 CLINICAL DATA:  68 year old female with history of respiratory failure. EXAM: PORTABLE CHEST 1 VIEW  COMPARISON:  Chest x-ray 04-28-19. FINDINGS: An endotracheal tube is in place with tip 5.1 cm above the carina. A nasogastric tube is seen extending into the stomach, however, the tip of the nasogastric tube extends below the lower margin of the image. Patchy multifocal interstitial and airspace disease noted throughout the right lung, most confluent in the right upper lobe, with slightly worsened aeration compared to yesterday's examination. Left lung is clear. No pleural effusions. No evidence of pulmonary edema. Heart size is normal. Upper mediastinal contours are within normal limits. IMPRESSION: 1. Severe multilobar pneumonia in the right lung, most pronounced in the right upper lobe, with worsening aeration compared to yesterday's examination. 2. Support apparatus, as above. Electronically Signed   By: Vinnie Langton M.D.   On: 04/01/2019 08:03   ASSESSMENT AND PLAN:   Acute hypoxic respiratory failure due to right upper lobe pneumonia- remains intubated this morning -Vent management per CCM -Continue unasyn -Respiratory culture ordered  Sepsis due to CAP- meeting sepsis criteria on admission with fever, tachycardia, and tachypnea. Procalcitonin elevated. Sepsis is resolving. -Trend lactic acid -Continue IVFs -Blood cultures with no growth x < 24 hours  Altered mental status due to unclear etiology- concern for possible seizures -Started on keppra -Neurology following -MRI brain ordered for today -EEG planned 9/14  All the records are reviewed and case discussed with Care Management/Social Worker. Management plans discussed with the patient, family and they are in agreement.  CODE STATUS: Full Code  TOTAL TIME TAKING CARE OF THIS PATIENT: 40 minutes.   More than 50% of the time was spent in counseling/coordination of care: YES  POSSIBLE D/C IN 3-4 DAYS, DEPENDING ON CLINICAL CONDITION.   Jinny BlossomKaty D Mayo M.D on 04/01/2019 at 2:03 PM  Between 7am to 6pm - Pager - [phone]   After 6pm go to [email or web address] - Social research officer, governmentpassword EPAS [facility]  [facility]  Office  [phone]  CC: Primary care physician; Jerl MinaHedrick, James, MD  Note: This dictation was prepared with Dragon dictation along with smaller phrase technology. Any transcriptional errors that result from this process are unintentional.

## 2019-04-01 NOTE — Progress Notes (Signed)
PHARMACIST - PHYSICIAN COMMUNICATION  CONCERNING: IV to Oral Route Change Policy  RECOMMENDATION: This patient is receiving famotidine by the intravenous route.  Based on criteria approved by the Pharmacy and Therapeutics Committee, the intravenous medication(s) is/are being converted to the equivalent oral dose form(s).   DESCRIPTION: These criteria include:  The patient is eating (either orally or via tube) and/or has been taking other orally administered medications for a least 24 hours  The patient has no evidence of active gastrointestinal bleeding or impaired GI absorption (gastrectomy, short bowel, patient on TNA or NPO).  If you have questions about this conversion, please contact the Pharmacy Department at [phone].   Simpson,Michael L, [facility] 04/01/2019 3:01 PM

## 2019-04-01 NOTE — Consult Note (Signed)
PULMONARY/CCM PROGRESS NOTE  PT PROFILE: 68 y.o. F never smoker intubated in ED for decreased LOC and acute hypoxemic respiratory failure. Little history available. CXR c/w PNA  MAJOR EVENTS/TEST RESULTS: 09/12 Admitted via ED, intubated with admission dx of CAP 09/12 CT head: No intracranial abnormalities noted 09/13 Minimally responsive off of all sedation X several hours. Staring blankly with upward gaze preference and rhythmic blinking. Neurology consultation requested. Keppra initiated. MRI brain ordered. EEG planned 09/13 MRI brain:   INDWELLING DEVICES: ETT 09/12 >>    MICRO DATA: SARS-CoV-2 PCR 09/12 >> NEG MRSA PCR 09/12 >> NEG Urine 09/12 >>  Resp 09/12 >>  Blood 09/12 >>  Strep urine antigen 9/12 >> NEG Legionella urine antigen 9/12 >>   PCT 09/12: 2.52, 14.37,   ANTIMICROBIALS:  Anti-infectives (From admission, onward)   Start     Dose/Rate Route Frequency Ordered Stop   04/10/2019 2200  Ampicillin-Sulbactam (UNASYN) 3 g in sodium chloride 0.9 % 100 mL IVPB     3 g 200 mL/hr over 30 Minutes Intravenous Every 6 hours 03/28/2019 1915     03/22/2019 1230  cefTRIAXone (ROCEPHIN) 2 g in sodium chloride 0.9 % 100 mL IVPB  Status:  Discontinued     2 g 200 mL/hr over 30 Minutes Intravenous Every 24 hours 04/04/2019 1224 03/28/2019 1915   04/09/2019 1230  azithromycin (ZITHROMAX) 500 mg in sodium chloride 0.9 % 250 mL IVPB  Status:  Discontinued     500 mg 250 mL/hr over 60 Minutes Intravenous Every 24 hours 04/17/2019 1224 04/03/2019 1915        SUBJ: Belated history was obtained from patient's sister-in-law who is a very close friend.  Patient's husband is recently deceased.  She has been grieving his loss.  However, per her sister-in-law, it is not possible that she took an intentional overdose of medication in her state of despondency.  Patient was in [place] the evening of 9/11 with plans to travel to the [place] with her sister-in-law on 9/12.  Sister-in-law found her unresponsive  morning of 9/12 at which time EMS was dispatched.  Patient had not soiled the bed sheets.  She reportedly felt "warm to the touch".  This morning, patient has been off of all sedative infusions and medications for several hours.  I found her responsive to painful stimuli.  She would open eyes but not follow commands.  She was staring blankly with rhythmic blinking and an upward gaze preference.  OBJ: Vitals:   04/01/19 0900 04/01/19 1000 04/01/19 1100 04/01/19 1200  BP: 124/67 123/60 126/68 129/64  Pulse: 99 98 98 (!) 102  Resp: (!) 25 (!) 26 (!) 26 (!) 27  Temp: 99.3 F (37.4 C) 99.5 F (37.5 C) 99.7 F (37.6 C) 99.7 F (37.6 C)  TempSrc:      SpO2: 94% 92% 92% 93%  Weight:      Height:        Vent Mode: PRVC FiO2 (%):  [40 %-75 %] 40 % Set Rate:  [20 bmp] 20 bmp Vt Set:  [500 mL] 500 mL PEEP:  [5 cmH20-8 cmH20] 5 cmH20 Plateau Pressure:  [18 cmH20-23 cmH20] 18 cmH20  Gen: Intubated, RASS -3, not F/C staring blankly, rhythmic blinking, upward gaze preference HEENT: NCAT, sclerae white Neck: No LAN, no JVD noted Lungs: Minimal R >L rhonchi, no wheezes.  Synchronous with vent Cardiovascular: Regular, no M noted Abdomen: Soft, NT, +BS Ext: Warm, no edema Neuro: Upward gaze PERRL, EOMI, + withdrawal from pain,  minimal spontaneous movement, mildly hyperreflexic, 3-4 beat R ankle clonus which could not be reproduced Skin: No lesions noted   BMP Latest Ref Rng & Units 04/01/2019 04/06/2019  Glucose 70 - 99 mg/dL 121(H) 148(H)  BUN 8 - 23 mg/dL 21 18  Creatinine 0.44 - 1.00 mg/dL 0.92 1.09(H)  Sodium 135 - 145 mmol/L 142 140  Potassium 3.5 - 5.1 mmol/L 3.8 4.4  Chloride 98 - 111 mmol/L 111 107  CO2 22 - 32 mmol/L 20(L) 21(L)  Calcium 8.9 - 10.3 mg/dL 7.7(L) 8.2(L)    Hepatic Function Latest Ref Rng & Units 04/01/2019 03/25/2019  Total Protein 6.5 - 8.1 g/dL 5.8(L) 7.4  Albumin 3.5 - 5.0 g/dL 2.9(L) 4.0  AST 15 - 41 U/L 72(H) 42(H)  ALT 0 - 44 U/L 36 22  Alk Phosphatase 38  - 126 U/L 52 70  Total Bilirubin 0.3 - 1.2 mg/dL 0.7 0.8    CBC Latest Ref Rng & Units 04/01/2019 03/27/2019  WBC 4.0 - 10.5 K/uL 8.6 8.2  Hemoglobin 12.0 - 15.0 g/dL 12.6 14.7  Hematocrit 36.0 - 46.0 % 38.9 44.5  Platelets 150 - 400 K/uL 203 274    ABG    Component Value Date/Time   PHART 7.31 (L) 03/30/2019 1112   PCO2ART 48 04/13/2019 1112   PO2ART 136 (H) 03/22/2019 1112   HCO3 24.2 04/07/2019 1112   ACIDBASEDEF 2.5 (H) 03/27/2019 1112   O2SAT 98.9 04/05/2019 1112   CXR: Increased RUL consolidation   IMPRESSION: Acute hypoxemic respiratory failure RUL community-acquired pneumonia Severe sepsis Sinus tachycardia, resolved Altered mental status Concern for seizure  PLAN/REC: Cont vent support - settings reviewed and adjusted Cont vent bundle Daily SBT if/when meets criteria Monitor temp, WBC count Micro and abx as above Initiate TF protocol 09/13 Neurology consultation requested 09/13 Keppra initiated 09/13 MRI brain ordered 09/13 EEG planned 09/14 DVT px: enoxaparin Monitor CBC intermittently Transfuse per usual guidelines PAD protocol: intermittent midaz, fentanyl. RASS goal 0, -1  Sister-in-law (closest family member) updated in detail in conference room.   CCM time: 35 mins The above time includes time spent in consultation with patient and/or family members and reviewing care plan on multidisciplinary rounds  Merton Border, MD PCCM service Mobile [phone] Pager [phone] 04/01/2019 1:34 PM

## 2019-04-02 ENCOUNTER — Inpatient Hospital Stay: Payer: Medicare Other

## 2019-04-02 DIAGNOSIS — G9340 Encephalopathy, unspecified: Secondary | ICD-10-CM

## 2019-04-02 LAB — CBC
HCT: 33.7 % — ABNORMAL LOW (ref 36.0–46.0)
Hemoglobin: 11.2 g/dL — ABNORMAL LOW (ref 12.0–15.0)
MCH: 31.3 pg (ref 26.0–34.0)
MCHC: 33.2 g/dL (ref 30.0–36.0)
MCV: 94.1 fL (ref 80.0–100.0)
Platelets: 194 10*3/uL (ref 150–400)
RBC: 3.58 MIL/uL — ABNORMAL LOW (ref 3.87–5.11)
RDW: 13.2 % (ref 11.5–15.5)
WBC: 12.7 10*3/uL — ABNORMAL HIGH (ref 4.0–10.5)
nRBC: 0 % (ref 0.0–0.2)

## 2019-04-02 LAB — BASIC METABOLIC PANEL
Anion gap: 7 (ref 5–15)
BUN: 19 mg/dL (ref 8–23)
CO2: 23 mmol/L (ref 22–32)
Calcium: 7.9 mg/dL — ABNORMAL LOW (ref 8.9–10.3)
Chloride: 110 mmol/L (ref 98–111)
Creatinine, Ser: 0.84 mg/dL (ref 0.44–1.00)
GFR calc Af Amer: >60 mL/min (ref >60)
GFR calc non Af Amer: >60 mL/min (ref >60)
Glucose, Bld: 137 mg/dL — ABNORMAL HIGH (ref 70–99)
Potassium: 3.5 mmol/L (ref 3.5–5.1)
Sodium: 140 mmol/L (ref 135–145)

## 2019-04-02 LAB — URINE CULTURE: Culture: NO GROWTH

## 2019-04-02 LAB — TSH: TSH: 3.114 u[IU]/mL (ref 0.350–4.500)

## 2019-04-02 LAB — PHOSPHORUS: Phosphorus: 1.6 mg/dL — ABNORMAL LOW (ref 2.5–4.6)

## 2019-04-02 LAB — TRIGLYCERIDES: Triglycerides: 162 mg/dL — ABNORMAL HIGH (ref <150)

## 2019-04-02 LAB — GLUCOSE, CAPILLARY
Glucose-Capillary: 105 mg/dL — ABNORMAL HIGH (ref 70–99)
Glucose-Capillary: 123 mg/dL — ABNORMAL HIGH (ref 70–99)

## 2019-04-02 LAB — PROCALCITONIN: Procalcitonin: 7.88 ng/mL

## 2019-04-02 LAB — MAGNESIUM: Magnesium: 2.1 mg/dL (ref 1.7–2.4)

## 2019-04-02 IMAGING — DX DG CHEST 1V PORT
1 series · 1 of 1 positions shown · non-contrast
Comparison: Radiograph [DATE].

CLINICAL DATA: Respiratory failure.

EXAM:
PORTABLE CHEST 1 VIEW

[chest ap]
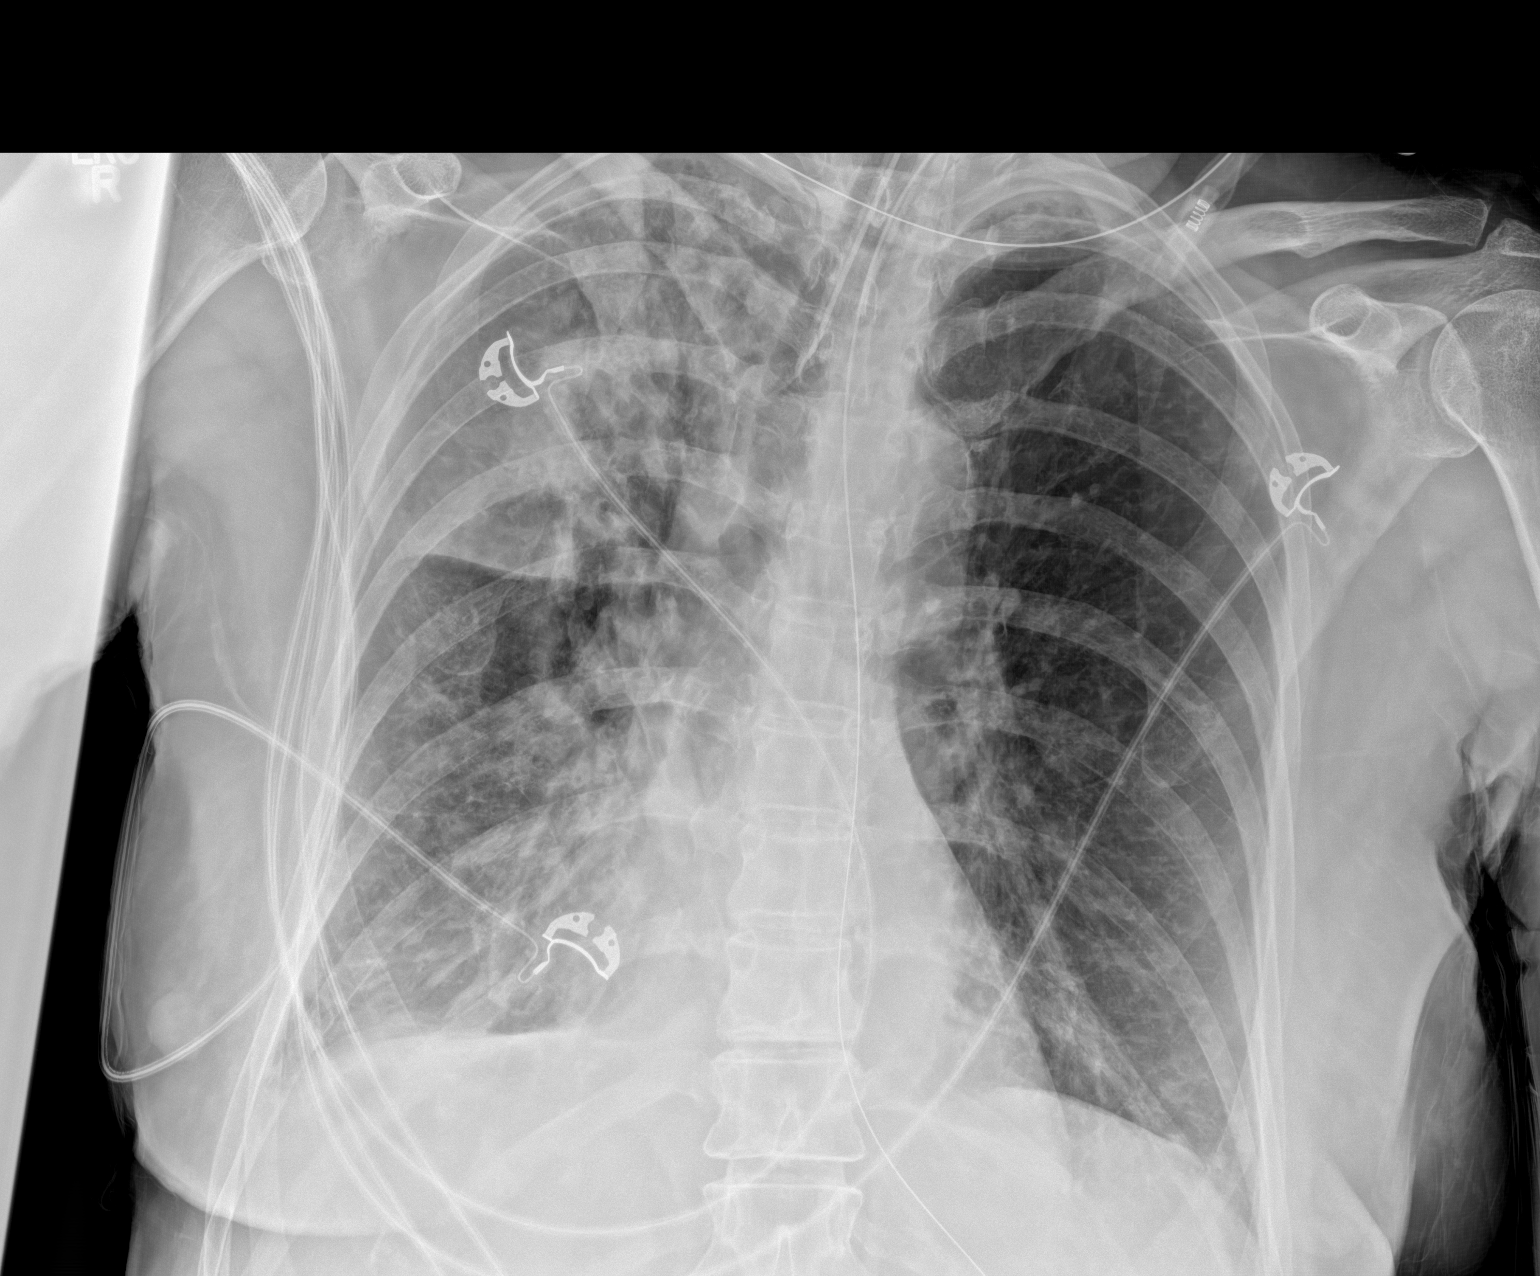

[1 of 1 positions shown; findings below may reference images not displayed]

FINDINGS: Endotracheal and nasogastric tubes are unchanged in position. No
pneumothorax is noted. No significant pleural effusion is noted.
Left lung is clear. Stable right upper and lower lobe airspace
opacities are noted concerning for pneumonia. Bony thorax is
unremarkable.
IMPRESSION: Stable right lung opacities are noted concerning for pneumonia.
Stable support apparatus.

## 2019-04-02 MED ORDER — POTASSIUM PHOSPHATES 15 MMOLE/5ML IV SOLN
30.0000 mmol | Freq: Once | INTRAVENOUS | Status: AC
  Administered 2019-04-02: 30 mmol via INTRAVENOUS
  Filled 2019-04-02: qty 10

## 2019-04-02 MED ORDER — VITAMIN B-1 100 MG PO TABS
100.0000 mg | ORAL_TABLET | Freq: Every day | ORAL | Status: DC
  Administered 2019-04-02 – 2019-04-11 (×10): 100 mg
  Filled 2019-04-02 (×10): qty 1

## 2019-04-02 MED ORDER — LEVETIRACETAM IN NACL 500 MG/100ML IV SOLN
500.0000 mg | Freq: Two times a day (BID) | INTRAVENOUS | Status: DC
  Administered 2019-04-03 – 2019-04-05 (×6): 500 mg via INTRAVENOUS
  Filled 2019-04-02 (×7): qty 100

## 2019-04-02 MED ORDER — FOLIC ACID 1 MG PO TABS
1.0000 mg | ORAL_TABLET | Freq: Every day | ORAL | Status: DC
  Administered 2019-04-02 – 2019-04-11 (×10): 1 mg
  Filled 2019-04-02 (×10): qty 1

## 2019-04-02 MED ORDER — FREE WATER
100.0000 mL | Freq: Three times a day (TID) | Status: DC
  Administered 2019-04-02 – 2019-04-06 (×12): 100 mL

## 2019-04-02 MED ORDER — ADULT MULTIVITAMIN LIQUID CH
15.0000 mL | Freq: Every day | ORAL | Status: DC
  Administered 2019-04-02 – 2019-04-09 (×8): 15 mL
  Filled 2019-04-02 (×9): qty 15

## 2019-04-02 NOTE — Progress Notes (Signed)
Reason for Consult:Possible seizure Referring Physician: Alva Garnet  9/14: MRI On 9/13No abnormality seen to explain the clinical presentation. No acute or subacute infarction or any reversible finding. Moderate chronic small-vessel ischemic changes the cerebral hemispheric white matter.  EEG pending  Neuro exam: Intubated, on no sedation Mental Status: Patient would close eyes and open eyes intermittently to commands.  Grimaces to deep sternal rub.  Does not follow commands.  No verbalizations noted.  Cranial Nerves: II: patient does not respond confrontation bilaterally, pupils right 3 mm, left 3 mm,and reactive bilaterally III,IV,VI: Oculocephalic response present bilaterally.  V,VII: corneal reflex present bilaterally  VIII: patient does not respond to verbal stimuli IX,X: gag reflex unable to be tested, XI: trapezius strength unable to test bilaterally XII: tongue strength unable to test Motor: Will withdraw to painful stimuli on LLEx not UEX Sensory: Grimaces to painfull stimuli.  Deep Tendon Reflexes:  3+ throughout. Plantars: Mute bilaterally Cerebellar: Unable to perform  Neuro protective measures including normothermia, normoglycemia, correct electrolytes/metaboic abnlites Continue Keprra Will f/up on EEG Seizure precautions. Ativan prn for sz.    HPI: Sonia Farrell is an 68 y.o. female who is intubated and unable to provide any history therefore all history obtained from the chart.  Patient with a known history of hypothyroidism who presents via EMS due to unresponsiveness.  Apparently patient was found unresponsive this morning by family.  Patient has had depression since her husband passed away a few weeks ago.  Patient has been taking an SSRI.  No report of suicidal ideations.  Patient was intubated due to inability to protect airway.  She is found to have pneumonia and started on antibiotics while in the emergency room.  Today noted to have eyes deviated upward  with some unususal eye movements at times concerning for seizure.    Past Medical History:  Diagnosis Date  . Arthritis    fingers  . GERD (gastroesophageal reflux disease)   . Hyperlipidemia   . Hypothyroidism     Past Surgical History:  Procedure Laterality Date  . CATARACT EXTRACTION W/PHACO Left 12/26/2018   Procedure: CATARACT EXTRACTION PHACO AND INTRAOCULAR LENS PLACEMENT ([place]) LEFT;  Surgeon: Birder Robson, MD;  Location: [facility];  Service: Ophthalmology;  Laterality: Left;  . CATARACT EXTRACTION W/PHACO Right 01/23/2019   Procedure: CATARACT EXTRACTION PHACO AND INTRAOCULAR LENS PLACEMENT ([place]) RIGHT;  Surgeon: Birder Robson, MD;  Location: [facility];  Service: Ophthalmology;  Laterality: Right;  . COLONOSCOPY      Family History  Problem Relation Age of Onset  . Breast cancer Neg Hx     Social History:  reports that she has never smoked. She has never used smokeless tobacco. She reports current alcohol use of about 1.0 standard drinks of alcohol per week. No history on file for drug.  Allergies  Allergen Reactions  . Escitalopram Oxalate Other (See Comments)    MUSCLE SPASMS    Medications:  I have reviewed the patient's current medications. Prior to Admission:  Medications Prior to Admission  Medication Sig Dispense Refill Last Dose  . ALPRAZolam (XANAX) 0.5 MG tablet Take 0.5 mg by mouth as needed for anxiety.   Past Week at Unknown time  . levothyroxine (SYNTHROID) 75 MCG tablet Take 75 mcg by mouth daily before breakfast.   Past Week at Unknown time  . pantoprazole (PROTONIX) 40 MG tablet Take 40 mg by mouth daily as needed.   Past Week at Unknown time  . PARoxetine (PAXIL) 10 MG tablet  Take 10 mg by mouth daily.   Past Week at Unknown time  . rosuvastatin (CRESTOR) 10 MG tablet Take 10 mg by mouth daily.   Past Week at Unknown time  . acetaminophen (TYLENOL) 325 MG tablet Take 650 mg by mouth every 6 (six) hours as needed.   prn at  prn   Scheduled: . chlorhexidine gluconate (MEDLINE KIT)  15 mL Mouth Rinse BID  . Chlorhexidine Gluconate Cloth  6 each Topical Daily  . enoxaparin (LOVENOX) injection  40 mg Subcutaneous Daily  . famotidine  20 mg Per Tube QHS  . feeding supplement (PRO-STAT SUGAR FREE 64)  30 mL Per Tube BID  . folic acid  1 mg Per Tube Daily  . free water  200 mL Per Tube Q8H  . mouth rinse  15 mL Mouth Rinse 10 times per day  . multivitamin  15 mL Per Tube Daily  . thiamine  100 mg Per Tube Daily    ROS: Unable to provide  Physical Examination: Blood pressure (!) 141/71, pulse (!) 103, temperature (!) 100.5 F (38.1 C), temperature source Axillary, resp. rate (!) 27, height '5\' 7"'  (1.702 m), weight 78.6 kg, SpO2 96 %.  HEENT-  Normocephalic, no lesions, without obvious abnormality.  Normal external eye and conjunctiva.  Normal TM's bilaterally.  Normal auditory canals and external ears. Normal external nose, mucus membranes and septum.  Normal pharynx. Cardiovascular- S1, S2 normal, pulses palpable throughout   Lungs- chest clear, no wheezing, rales, normal symmetric air entry Abdomen- soft, non-tender; bowel sounds normal; no masses,  no organomegaly Extremities- no edema Lymph-no adenopathy palpable Musculoskeletal-no joint tenderness, deformity or swelling Skin-warm and dry, no hyperpigmentation, vitiligo, or suspicious lesions  Neurological Examination   Mental Status: Patient does not respond to verbal stimuli.  Grimaces to deep sternal rub.  Does not follow commands.  No verbalizations noted.  Cranial Nerves: II: patient does not respond confrontation bilaterally, pupils right 3 mm, left 3 mm,and reactive bilaterally III,IV,VI: Oculocephalic response present bilaterally.  V,VII: corneal reflex present bilaterally  VIII: patient does not respond to verbal stimuli IX,X: gag reflex unable to be tested, XI: trapezius strength unable to test bilaterally XII: tongue strength unable to  test Motor: Extremities flaccid throughout.  No spontaneous movement noted.  No purposeful movements noted. Sensory: Grimaces with painful stimuli in all extremities.  Some withdrawal noted in the lower extremities with painful stimuli Deep Tendon Reflexes:  3+ throughout. Plantars: Mute bilaterally Cerebellar: Unable to perform   Laboratory Studies:   Basic Metabolic Panel: Recent Labs  Lab 04/03/2019 1112 04/01/19 0547 04/02/19 0501  NA 140 142 140  K 4.4 3.8 3.5  CL 107 111 110  CO2 21* 20* 23  GLUCOSE 148* 121* 137*  BUN '18 21 19  ' CREATININE 1.09* 0.92 0.84  CALCIUM 8.2* 7.7* 7.9*  MG  --  1.7 2.1  PHOS  --  2.3* 1.6*    Liver Function Tests: Recent Labs  Lab 04/14/2019 1112 04/01/19 0547  AST 42* 72*  ALT 22 36  ALKPHOS 70 52  BILITOT 0.8 0.7  PROT 7.4 5.8*  ALBUMIN 4.0 2.9*   No results for input(s): LIPASE, AMYLASE in the last 168 hours. No results for input(s): AMMONIA in the last 168 hours.  CBC: Recent Labs  Lab 04/03/2019 1112 04/01/19 0547 04/02/19 0501  WBC 8.2 8.6 12.7*  NEUTROABS 7.1  --   --   HGB 14.7 12.6 11.2*  HCT 44.5 38.9 33.7*  MCV  93.9 95.3 94.1  PLT 274 203 194    Cardiac Enzymes: No results for input(s): CKTOTAL, CKMB, CKMBINDEX, TROPONINI in the last 168 hours.  BNP: Invalid input(s): POCBNP  CBG: Recent Labs  Lab 04/09/2019 1744 04/10/2019 2356 04/01/19 1120 04/01/19 2329 04/02/19 0418  GLUCAP 125* 114* 107* 105* 105*    Microbiology: Results for orders placed or performed during the hospital encounter of 03/21/2019  Urine culture     Status: None   Collection Time: 03/28/2019 11:12 AM   Specimen: Urine, Random  Result Value Ref Range Status   Specimen Description   Final    URINE, RANDOM Performed at [facility], [address], [place], [place] [address]    Special Requests   Final    NONE Performed at [facility], [address], [place], [place] [address]    Culture   Final    NO  GROWTH Performed at [facility], [place] [address] [place], [place] [address]    Report Status 04/02/2019 FINAL  Final  Blood Cultures (routine x 2)     Status: None (Preliminary result)   Collection Time: 04/12/2019 11:38 AM   Specimen: BLOOD  Result Value Ref Range Status   Specimen Description BLOOD BLOOD RIGHT HAND  Final   Special Requests   Final    BOTTLES DRAWN AEROBIC AND ANAEROBIC Blood Culture results may not be optimal due to an excessive volume of blood received in culture bottles   Culture   Final    NO GROWTH 2 DAYS Performed at [facility], [address] [place], [place] [address]    Report Status PENDING  Incomplete  SARS Coronavirus 2 [facility] order, Performed in [facility] hospital lab) Nasopharyngeal     Status: None   Collection Time: 04/05/2019 11:38 AM   Specimen: Nasopharyngeal  Result Value Ref Range Status   SARS Coronavirus 2 NEGATIVE NEGATIVE Final    Comment: (NOTE) If result is NEGATIVE SARS-CoV-2 target nucleic acids are NOT DETECTED. The SARS-CoV-2 RNA is generally detectable in upper and lower  respiratory specimens during the acute phase of infection. The lowest  concentration of SARS-CoV-2 viral copies this assay can detect is 250  copies / mL. A negative result does not preclude SARS-CoV-2 infection  and should not be used as the sole basis for treatment or other  patient management decisions.  A negative result may occur with  improper specimen collection / handling, submission of specimen other  than nasopharyngeal swab, presence of viral mutation(s) within the  areas targeted by this assay, and inadequate number of viral copies  (<250 copies / mL). A negative result must be combined with clinical  observations, patient history, and epidemiological information. If result is POSITIVE SARS-CoV-2 target nucleic acids are DETECTED. The SARS-CoV-2 RNA is generally detectable in upper and lower  respiratory specimens dur ing the  acute phase of infection.  Positive  results are indicative of active infection with SARS-CoV-2.  Clinical  correlation with patient history and other diagnostic information is  necessary to determine patient infection status.  Positive results do  not rule out bacterial infection or co-infection with other viruses. If result is PRESUMPTIVE POSTIVE SARS-CoV-2 nucleic acids MAY BE PRESENT.   A presumptive positive result was obtained on the submitted specimen  and confirmed on repeat testing.  While 2019 novel coronavirus  (SARS-CoV-2) nucleic acids may be present in the submitted sample  additional confirmatory testing may be necessary for epidemiological  and / or clinical management purposes  to differentiate between  SARS-CoV-2 and other Sarbecovirus currently known to infect humans.  If clinically indicated additional testing with an alternate test  methodology [phone]) is advised. The SARS-CoV-2 RNA is generally  detectable in upper and lower respiratory sp ecimens during the acute  phase of infection. The expected result is Negative. Fact Sheet for Patients:  StrictlyIdeas.no Fact Sheet for Healthcare Providers: BankingDealers.co.za This test is not yet approved or cleared by the [place] FDA and has been authorized for detection and/or diagnosis of SARS-CoV-2 by FDA under an Emergency Use Authorization (EUA).  This EUA will remain in effect (meaning this test can be used) for the duration of the COVID-19 declaration under Section 564(b)(1) of the Act, 21 U.S.C. section 360bbb-3(b)(1), unless the authorization is terminated or revoked sooner. Performed at [facility], [address] [place], [place] [address]   Blood Cultures (routine x 2)     Status: None (Preliminary result)   Collection Time: 04/04/2019 11:39 AM   Specimen: BLOOD  Result Value Ref Range Status   Specimen Description BLOOD BLOOD RIGHT FOREARM   Final   Special Requests   Final    BOTTLES DRAWN AEROBIC AND ANAEROBIC Blood Culture adequate volume   Culture   Final    NO GROWTH 2 DAYS Performed at [facility], [address] [place], [place] [address]    Report Status PENDING  Incomplete  MRSA PCR Screening     Status: None   Collection Time: 04/12/2019  5:46 PM   Specimen: Nasopharyngeal  Result Value Ref Range Status   MRSA by PCR NEGATIVE NEGATIVE Final    Comment:        The GeneXpert MRSA Assay (FDA approved for NASAL specimens only), is one component of a comprehensive MRSA colonization surveillance program. It is not intended to diagnose MRSA infection nor to guide or monitor treatment for MRSA infections. Performed at [facility], [address] [place], [place] [address]     Coagulation Studies: Recent Labs    03/27/2019 1112  LABPROT 12.8  INR 1.0    Urinalysis:  Recent Labs  Lab 03/25/2019 1112  COLORURINE YELLOW*  LABSPEC 1.012  PHURINE 5.0  GLUCOSEU NEGATIVE  HGBUR LARGE*  BILIRUBINUR NEGATIVE  KETONESUR NEGATIVE  PROTEINUR NEGATIVE  NITRITE NEGATIVE  LEUKOCYTESUR NEGATIVE    Lipid Panel:     Component Value Date/Time   TRIG 162 (H) 04/02/2019 0501    HgbA1C: No results found for: HGBA1C  Urine Drug Screen:      Component Value Date/Time   LABOPIA NONE DETECTED 03/30/2019 1112   COCAINSCRNUR NONE DETECTED 04/09/2019 1112   LABBENZ POSITIVE (A) 03/27/2019 1112   AMPHETMU NONE DETECTED 04/17/2019 1112   THCU NONE DETECTED 04/08/2019 1112   LABBARB NONE DETECTED 03/30/2019 1112    Alcohol Level:  Recent Labs  Lab 03/25/2019 1138  ETH <10    Other results: EKG: sinus tachycardia at 120 bpm.  Imaging: Dg Abdomen 1 View  Result Date: 03/22/2019 CLINICAL DATA:  Unresponsive.  NG tube placement EXAM: ABDOMEN - 1 VIEW COMPARISON:  None. FINDINGS: 1149 hours. Prominent gastric bubble noted. NG tube tip is in the mid stomach with proximal port of the NG tube  below the EG junction. No gaseous bowel distention within the visualized abdomen. Visualized bony anatomy unremarkable. IMPRESSION: NG tube tip is in the mid stomach. Electronically Signed   By: Misty Stanley M.D.   On: 03/26/2019  12:25   Ct Head Wo Contrast  Result Date: 04/09/2019 CLINICAL DATA:  Altered level of consciousness. EXAM: CT HEAD WITHOUT CONTRAST TECHNIQUE: Contiguous axial images were obtained from the base of the skull through the vertex without intravenous contrast. COMPARISON:  None. FINDINGS: Brain: There is no evidence for acute hemorrhage, hydrocephalus, mass lesion, or abnormal extra-axial fluid collection. No definite CT evidence for acute infarction. Vascular: No hyperdense vessel or unexpected calcification. Skull: No evidence for fracture. No worrisome lytic or sclerotic lesion. Sinuses/Orbits: The visualized paranasal sinuses and mastoid air cells are clear. Visualized portions of the globes and intraorbital fat are unremarkable. Other: None. IMPRESSION: Unremarkable CT evaluation of the brain. No acute intracranial abnormality. Electronically Signed   By: Misty Stanley M.D.   On: 03/30/2019 12:46   Mr Brain Wo Contrast  Result Date: 04/01/2019 CLINICAL DATA:  Acute presentation unresponsive. EXAM: MRI HEAD WITHOUT CONTRAST TECHNIQUE: Multiplanar, multiecho pulse sequences of the brain and surrounding structures were obtained without intravenous contrast. COMPARISON:  Head CT 03/25/2019 FINDINGS: Brain: Diffusion imaging does not show any acute or subacute infarction. Brainstem and cerebellum are normal. Cerebral hemispheres show moderate changes of chronic small vessel disease affecting the deep and subcortical white matter. No cortical or large vessel territory infarction. No mass lesion, hemorrhage, hydrocephalus or extra-axial collection. Vascular: Major vessels at the base of the brain show flow. Skull and upper cervical spine: Negative Sinuses/Orbits: Clear/normal Other: None  IMPRESSION: No abnormality seen to explain the clinical presentation. No acute or subacute infarction or any reversible finding. Moderate chronic small-vessel ischemic changes the cerebral hemispheric white matter. Electronically Signed   By: Nelson Chimes M.D.   On: 04/01/2019 14:55   Dg Chest Port 1 View  Result Date: 04/02/2019 CLINICAL DATA:  Respiratory failure. EXAM: PORTABLE CHEST 1 VIEW COMPARISON:  Radiograph of April 01, 2019. FINDINGS: Endotracheal and nasogastric tubes are unchanged in position. No pneumothorax is noted. No significant pleural effusion is noted. Left lung is clear. Stable right upper and lower lobe airspace opacities are noted concerning for pneumonia. Bony thorax is unremarkable. IMPRESSION: Stable right lung opacities are noted concerning for pneumonia. Stable support apparatus. Electronically Signed   By: Marijo Conception M.D.   On: 04/02/2019 07:46   Dg Chest Port 1 View  Result Date: 04/01/2019 CLINICAL DATA:  68 year old female with history of respiratory failure. EXAM: PORTABLE CHEST 1 VIEW COMPARISON:  Chest x-ray 04/12/2019. FINDINGS: An endotracheal tube is in place with tip 5.1 cm above the carina. A nasogastric tube is seen extending into the stomach, however, the tip of the nasogastric tube extends below the lower margin of the image. Patchy multifocal interstitial and airspace disease noted throughout the right lung, most confluent in the right upper lobe, with slightly worsened aeration compared to yesterday's examination. Left lung is clear. No pleural effusions. No evidence of pulmonary edema. Heart size is normal. Upper mediastinal contours are within normal limits. IMPRESSION: 1. Severe multilobar pneumonia in the right lung, most pronounced in the right upper lobe, with worsening aeration compared to yesterday's examination. 2. Support apparatus, as above. Electronically Signed   By: Vinnie Langton M.D.   On: 04/01/2019 08:03   Dg Chest Port 1  View  Result Date: 04/13/2019 CLINICAL DATA:  Status post intubation and OG tube placement. EXAM: PORTABLE CHEST 1 VIEW COMPARISON:  No comparison studies available. FINDINGS: 1147 hours. Endotracheal tube tip is 5.2 cm above the base of the carina. The NG tube passes into the stomach  although the distal tip position is not included on the film. Volume loss noted right hemithorax patchy airspace disease noted right upper lobe and to a lesser degree in the right mid lung. Left lung appears hyperexpanded but clear. Interstitial markings are diffusely coarsened with chronic features. The cardiopericardial silhouette is within normal limits for size. IMPRESSION: Volume loss right hemithorax with right upper and mid lung patchy airspace disease. Endotracheal tube and NG tube as above. Electronically Signed   By: Misty Stanley M.D.   On: 04/14/2019 12:23

## 2019-04-02 NOTE — Consult Note (Signed)
PHARMACY CONSULT NOTE - FOLLOW UP  Pharmacy Consult for Electrolyte Monitoring and Replacement   Recent Labs: Potassium (mmol/L)  Date Value  04/02/2019 3.5   Magnesium (mg/dL)  Date Value  04/02/2019 2.1   Calcium (mg/dL)  Date Value  04/02/2019 7.9 (L)   Albumin (g/dL)  Date Value  04/01/2019 2.9 (L)   Phosphorus (mg/dL)  Date Value  04/02/2019 1.6 (L)   Sodium (mmol/L)  Date Value  04/02/2019 140  Corrected calcium: 8.8 mg/dL   Assessment: 68 y/o F with acute hypoxic respiratory failure due to aspiration pneumonia in the setting of possible intentional overdose with benzos and alcohol. Patient remains intubated and sedating in the ICU.   Goal of Therapy:  K ~4 and Mg ~2  Plan:  Potassium phosphate 30 mmol IV x1.   Re-check BMP, phosphorous, and magnesium tomorrow AM.   [place] Resident 04/02/2019 3:31 PM

## 2019-04-02 NOTE — Progress Notes (Signed)
[place] [facility] [place] NAME: Sonia Farrell    MR#:  [id number]  DATE OF BIRTH:  September 10, 1950  SUBJECTIVE:   Remains intubated this morning. No acute events overnight.  REVIEW OF SYSTEMS:  ROS- unable to obtain due to intubation and sedation  DRUG ALLERGIES:   Allergies  Allergen Reactions  . Escitalopram Oxalate Other (See Comments)    MUSCLE SPASMS   VITALS:  Blood pressure (!) 149/79, pulse (!) 103, temperature 100.2 F (37.9 C), temperature source Axillary, resp. rate (!) 28, height 5\' 7"  (1.702 m), weight 78.6 kg, SpO2 96 %. PHYSICAL EXAMINATION:  Physical Exam  GENERAL:  Laying in the bed with no acute distress.  HEENT: Head atraumatic, normocephalic. Pupils equal, round, reactive to light and accommodation. No scleral icterus. Extraocular muscles intact. Oropharynx and nasopharynx clear. ETT in place NECK:  Supple, no jugular venous distention. No thyroid enlargement. LUNGS: +diminished breath sounds throughout all lung fields. No wheezes, crackles, rhonchi. No use of accessory muscles of respiration.  CARDIOVASCULAR: RRR, S1, S2 normal. No murmurs, rubs, or gallops.  ABDOMEN: Soft, nontender, nondistended. Bowel sounds present.  EXTREMITIES: No pedal edema, cyanosis, or clubbing.  NEUROLOGIC: intubated and sedated  PSYCHIATRIC: intubated and sedated SKIN: No obvious rash, lesion, or ulcer.  LABORATORY PANEL:  Female CBC Recent Labs  Lab 04/02/19 0501  WBC 12.7*  HGB 11.2*  HCT 33.7*  PLT 194   ------------------------------------------------------------------------------------------------------------------ Chemistries  Recent Labs  Lab 04/01/19 0547 04/02/19 0501  NA 142 140  K 3.8 3.5  CL 111 110  CO2 20* 23  GLUCOSE 121* 137*  BUN 21 19  CREATININE 0.92 0.84  CALCIUM 7.7* 7.9*  MG 1.7 2.1  AST 72*  --   ALT 36  --   ALKPHOS 52  --   BILITOT 0.7  --    RADIOLOGY:  Mr Brain Wo Contrast  Result Date:  04/01/2019 CLINICAL DATA:  Acute presentation unresponsive. EXAM: MRI HEAD WITHOUT CONTRAST TECHNIQUE: Multiplanar, multiecho pulse sequences of the brain and surrounding structures were obtained without intravenous contrast. COMPARISON:  Head CT 04-04-2019 FINDINGS: Brain: Diffusion imaging does not show any acute or subacute infarction. Brainstem and cerebellum are normal. Cerebral hemispheres show moderate changes of chronic small vessel disease affecting the deep and subcortical white matter. No cortical or large vessel territory infarction. No mass lesion, hemorrhage, hydrocephalus or extra-axial collection. Vascular: Major vessels at the base of the brain show flow. Skull and upper cervical spine: Negative Sinuses/Orbits: Clear/normal Other: None IMPRESSION: No abnormality seen to explain the clinical presentation. No acute or subacute infarction or any reversible finding. Moderate chronic small-vessel ischemic changes the cerebral hemispheric white matter. Electronically Signed   By: Nelson Chimes M.D.   On: 04/01/2019 14:55   Dg Chest Port 1 View  Result Date: 04/02/2019 CLINICAL DATA:  Respiratory failure. EXAM: PORTABLE CHEST 1 VIEW COMPARISON:  Radiograph of April 01, 2019. FINDINGS: Endotracheal and nasogastric tubes are unchanged in position. No pneumothorax is noted. No significant pleural effusion is noted. Left lung is clear. Stable right upper and lower lobe airspace opacities are noted concerning for pneumonia. Bony thorax is unremarkable. IMPRESSION: Stable right lung opacities are noted concerning for pneumonia. Stable support apparatus. Electronically Signed   By: Marijo Conception M.D.   On: 04/02/2019 07:46   ASSESSMENT AND PLAN:   Acute hypoxic respiratory failure due to aspiration pneumonia- in the setting of possible intentional overdose with benzos. Remains intubated. -Vent  management per CCM -Continue unasyn -Respiratory culture ordered  Sepsis due to aspiration pneumonia-  meeting sepsis criteria on admission with fever, tachycardia, and tachypnea. Procalcitonin elevated. Sepsis is resolving. -Trend lactic acid -Continue IVFs -Blood cultures with no growth to date  Altered mental status due to unclear etiology- possible benzo overdose vs seizures -Continue keppra -Neurology following -MRI brain unremarkable -EEG planned for today  All the records are reviewed and case discussed with Care Management/Social Worker. Management plans discussed with the patient, family and they are in agreement.  CODE STATUS: Full Code  TOTAL TIME TAKING CARE OF THIS PATIENT: 40 minutes.   More than 50% of the time was spent in counseling/coordination of care: YES  POSSIBLE D/C unknown, DEPENDING ON CLINICAL CONDITION.   Jinny BlossomKaty D Mayo M.D on 04/02/2019 at 2:33 PM  Between 7am to 6pm - Pager - [phone]  After 6pm go to [email or web address] - Social research officer, governmentpassword [email or web address]  [facility]  Office  [phone]  CC: Primary care physician; Jerl MinaHedrick, James, MD  Note: This dictation was prepared with Dragon dictation along with smaller phrase technology. Any transcriptional errors that result from this process are unintentional.

## 2019-04-02 NOTE — Progress Notes (Addendum)
Name: Sonia Farrell MRN: [id number] DOB: Aug 24, 1950    ADMISSION DATE:  June 15, 2019 CONSULTATION DATE:  04/02/2019  CHIEF COMPLAINT:  Acute hypoxemic respiratory failure  BRIEF PATIENT DESCRIPTION: Sonia Farrell is a 68 year old female with a history of depression, hyperlipidemia, and hypothyroidism presenting to the ED unresponsive and sating in the 80s on nonrebreather. She was intubated upon arrival and transferred to the ICU. CXR shows right lung opacity suspicious for pneumonia and she is positive for benzodiazepines on urine drug screen, possible intentional overdose.  SIGNIFICANT EVENTS/STUDIES 9/12 admitted to ICU via ED, intubated and unresponsive 9/12 CXR suspicious for right lobe CAP, Lactic acid elevated 9/12 Possible seizure noted, CT head showed no intracranial abnormalities 9/13 MRI brain showed no abnormalities, still minimally responsive without sedation, remains on vent intubated  HISTORY OF PRESENT ILLNESS:  History obtained from family member. Sonia Farrell is a 68 year old female with hx of depression, hyperlipidemia, and hypothyroidism found unresponsive by sister-in-law with a bottle of wine and drug bottles around her. Her husband died 3 weeks ago which has reportedly exacerbated depression.   PAST MEDICAL HISTORY :   has a past medical history of Arthritis, GERD (gastroesophageal reflux disease), Hyperlipidemia, and Hypothyroidism.  has a past surgical history that includes Colonoscopy; Cataract extraction w/PHACO (Left, 12/26/2018); and Cataract extraction w/PHACO (Right, 01/23/2019). Prior to Admission medications   Medication Sig Start Date End Date Taking? Authorizing Provider  ALPRAZolam Prudy Feeler(XANAX) 0.5 MG tablet Take 0.5 mg by mouth as needed for anxiety.   Yes [provider]  levothyroxine (SYNTHROID) 75 MCG tablet Take 75 mcg by mouth daily before breakfast.   Yes [provider]  pantoprazole (PROTONIX) 40 MG tablet Take 40 mg by mouth  daily as needed.   Yes [provider]  PARoxetine (PAXIL) 10 MG tablet Take 10 mg by mouth daily.   Yes [provider]  rosuvastatin (CRESTOR) 10 MG tablet Take 10 mg by mouth daily.   Yes [provider]  acetaminophen (TYLENOL) 325 MG tablet Take 650 mg by mouth every 6 (six) hours as needed.    [provider]   Allergies  Allergen Reactions  . Escitalopram Oxalate Other (See Comments)    MUSCLE SPASMS    FAMILY HISTORY:  family history is not on file. SOCIAL HISTORY:  reports that she has never smoked. She has never used smokeless tobacco. She reports current alcohol use of about 1.0 standard drinks of alcohol per week.  REVIEW OF SYSTEMS:   Unable to obtain, pt intubated and minimally responsive  SUBJECTIVE:  Unable to obtain, pt intubated and minimally responsive  VITAL SIGNS: Temp:  [99.4 F (37.4 C)-100.7 F (38.2 C)] 100.5 F (38.1 C) (09/14 0800) Pulse Rate:  [102-120] 103 (09/14 1000) Resp:  [25-31] 27 (09/14 1000) BP: (109-148)/(46-74) 141/71 (09/14 1000) SpO2:  [90 %-99 %] 96 % (09/14 1000) FiO2 (%):  [28 %-50 %] 28 % (09/14 0753)  PHYSICAL EXAMINATION: General:  Intubated, RASS -3, opens eyes to voice but stares blankly Neuro:  Upward gaze, PERRL, +withdrawal from painful stimuli, minimal spontaneous movement HEENT:  Atraumatic normocephalic Cardiovascular:  Regular rate and rhythm, no m/r/g Lungs:  Lungs clear to auscultation Abdomen:  Soft, non-tender, non-distended Extremities: warm, no edema noted Skin:  Warm, dry, intact  Recent Labs  Lab 08/24/2018 1112 04/01/19 0547 04/02/19 0501  NA 140 142 140  K 4.4 3.8 3.5  CL 107 111 110  CO2 21* 20* 23  BUN 18 21 19   CREATININE  1.09* 0.92 0.84  GLUCOSE 148* 121* 137*   Recent Labs  Lab 04/06/2019 1112 04/01/19 0547 04/02/19 0501  HGB 14.7 12.6 11.2*  HCT 44.5 38.9 33.7*  WBC 8.2 8.6 12.7*  PLT 274 203 194   Dg Abdomen 1 View  Result Date: 2019-04-06  CLINICAL DATA:  Unresponsive.  NG tube placement EXAM: ABDOMEN - 1 VIEW COMPARISON:  None. FINDINGS: 1149 hours. Prominent gastric bubble noted. NG tube tip is in the mid stomach with proximal port of the NG tube below the EG junction. No gaseous bowel distention within the visualized abdomen. Visualized bony anatomy unremarkable. IMPRESSION: NG tube tip is in the mid stomach. Electronically Signed   By: Misty Stanley M.D.   On: 06-Apr-2019 12:25   Ct Head Wo Contrast  Result Date: 06-Apr-2019 CLINICAL DATA:  Altered level of consciousness. EXAM: CT HEAD WITHOUT CONTRAST TECHNIQUE: Contiguous axial images were obtained from the base of the skull through the vertex without intravenous contrast. COMPARISON:  None. FINDINGS: Brain: There is no evidence for acute hemorrhage, hydrocephalus, mass lesion, or abnormal extra-axial fluid collection. No definite CT evidence for acute infarction. Vascular: No hyperdense vessel or unexpected calcification. Skull: No evidence for fracture. No worrisome lytic or sclerotic lesion. Sinuses/Orbits: The visualized paranasal sinuses and mastoid air cells are clear. Visualized portions of the globes and intraorbital fat are unremarkable. Other: None. IMPRESSION: Unremarkable CT evaluation of the brain. No acute intracranial abnormality. Electronically Signed   By: Misty Stanley M.D.   On: 04-06-19 12:46   Mr Brain Wo Contrast  Result Date: 04/01/2019 CLINICAL DATA:  Acute presentation unresponsive. EXAM: MRI HEAD WITHOUT CONTRAST TECHNIQUE: Multiplanar, multiecho pulse sequences of the brain and surrounding structures were obtained without intravenous contrast. COMPARISON:  Head CT 04-06-19 FINDINGS: Brain: Diffusion imaging does not show any acute or subacute infarction. Brainstem and cerebellum are normal. Cerebral hemispheres show moderate changes of chronic small vessel disease affecting the deep and subcortical white matter. No cortical or large vessel territory  infarction. No mass lesion, hemorrhage, hydrocephalus or extra-axial collection. Vascular: Major vessels at the base of the brain show flow. Skull and upper cervical spine: Negative Sinuses/Orbits: Clear/normal Other: None IMPRESSION: No abnormality seen to explain the clinical presentation. No acute or subacute infarction or any reversible finding. Moderate chronic small-vessel ischemic changes the cerebral hemispheric white matter. Electronically Signed   By: Nelson Chimes M.D.   On: 04/01/2019 14:55   Dg Chest Port 1 View  Result Date: 04/02/2019 CLINICAL DATA:  Respiratory failure. EXAM: PORTABLE CHEST 1 VIEW COMPARISON:  Radiograph of April 01, 2019. FINDINGS: Endotracheal and nasogastric tubes are unchanged in position. No pneumothorax is noted. No significant pleural effusion is noted. Left lung is clear. Stable right upper and lower lobe airspace opacities are noted concerning for pneumonia. Bony thorax is unremarkable. IMPRESSION: Stable right lung opacities are noted concerning for pneumonia. Stable support apparatus. Electronically Signed   By: Marijo Conception M.D.   On: 04/02/2019 07:46   Dg Chest Port 1 View  Result Date: 04/01/2019 CLINICAL DATA:  68 year old female with history of respiratory failure. EXAM: PORTABLE CHEST 1 VIEW COMPARISON:  Chest x-ray 04-06-19. FINDINGS: An endotracheal tube is in place with tip 5.1 cm above the carina. A nasogastric tube is seen extending into the stomach, however, the tip of the nasogastric tube extends below the lower margin of the image. Patchy multifocal interstitial and airspace disease noted throughout the right lung, most confluent in the right upper lobe, with  slightly worsened aeration compared to yesterday's examination. Left lung is clear. No pleural effusions. No evidence of pulmonary edema. Heart size is normal. Upper mediastinal contours are within normal limits. IMPRESSION: 1. Severe multilobar pneumonia in the right lung, most  pronounced in the right upper lobe, with worsening aeration compared to yesterday's examination. 2. Support apparatus, as above. Electronically Signed   By: Trudie Reed M.D.   On: 04/01/2019 08:03   Dg Chest Port 1 View  Result Date: 04/04/2019 CLINICAL DATA:  Status post intubation and OG tube placement. EXAM: PORTABLE CHEST 1 VIEW COMPARISON:  No comparison studies available. FINDINGS: 1147 hours. Endotracheal tube tip is 5.2 cm above the base of the carina. The NG tube passes into the stomach although the distal tip position is not included on the film. Volume loss noted right hemithorax patchy airspace disease noted right upper lobe and to a lesser degree in the right mid lung. Left lung appears hyperexpanded but clear. Interstitial markings are diffusely coarsened with chronic features. The cardiopericardial silhouette is within normal limits for size. IMPRESSION: Volume loss right hemithorax with right upper and mid lung patchy airspace disease. Endotracheal tube and NG tube as above. Electronically Signed   By: Kennith Center M.D.   On: 04/10/2019 12:23    ASSESSMENT / PLAN:  68 yo WF admitted to ICU for acute hypoxic resp failure from acute aspiration pneumonia/pneumonitis secondary to possible ETOH abuse and possible intentional drug OD from Benzo's causing acute metabolic encephalopathy   Severe ACUTE Hypoxic  Respiratory Failure -continue Mechanical Ventilator support -continue Bronchodilator Therapy -Wean Fio2 and PEEP as tolerated -VAP/VENT bundle implementation  INFECTIOUS DISEASE-ASPIRATION pneumonia RT lung -continue antibiotics as prescribed(unasyn) -follow up cultures   NEUROLOGY-no significant improvement in mentation - intubated and off sedation Probable seizures-on Keppra Seizure precautions CT/MRI WNL EEG pending Follow up neurology recs  ELECTROLYTES -follow labs as needed -replace as needed -pharmacy consultation and following   DVT/GI PRX ordered  TRANSFUSIONS AS NEEDED MONITOR FSBS ASSESS the need for LABS as needed   GI GI PROPHYLAXIS as indicated  NUTRITIONAL STATUS DIET-->TF's as tolerated Constipation protocol as indicated  Family updated at bedside, I strongly suggest DNR status   Critical Care Time devoted to patient care services described in this note is 34 minutes.   Overall, patient is critically ill, prognosis is guarded.     Lucie Leather, M.D.  [facility]  Medical Director [facility] Medical Director [facility] Cardio-Pulmonary Department

## 2019-04-02 NOTE — Consult Note (Signed)
Pharmacy Antibiotic Note  Sonia Farrell is a 68 y.o. female admitted on 03/21/2019 with aspiration pneumonia.  Pharmacy has been consulted for Unasyn dosing. Patient is currently intubated.   Today is day 2 of therapy with Unasyn for aspiration PNA. Worsening leukocytosis today. PCT down-trending. Patient with low grade fevers.   Plan: Continue Unasyn 3g q6h  Continue to monitor labs, vitals, cultures for signs of clinical improvement with antibiotic therapy.   Height: 5\' 7"  (170.2 cm) Weight: 173 lb 4.5 oz (78.6 kg) IBW/kg (Calculated) : 61.6  Temp (24hrs), Avg:100.2 F (37.9 C), Min:99.4 F (37.4 C), Max:100.7 F (38.2 C)  Recent Labs  Lab 04/09/2019 1112 04/02/2019 1138 03/25/2019 1330 04/01/19 0547 04/02/19 0501  WBC 8.2  --   --  8.6 12.7*  CREATININE 1.09*  --   --  0.92 0.84  LATICACIDVEN  --  4.0* 3.7*  --   --     Estimated Creatinine Clearance: 69.2 mL/min (by C-G formula based on SCr of 0.84 mg/dL).    Allergies  Allergen Reactions  . Escitalopram Oxalate Other (See Comments)    MUSCLE SPASMS    Antimicrobials this admission: 9/12 Azithromycin and Ceftriaxone x 1 9/12 Unasyn >>  Dose adjustments this admission: None  Microbiology results: 9/12 BCx: no growth 2 days  9/12 UCx: No growth 9/12 MRSA PCR: negative  9/12 COVID: negative   Thank you for allowing pharmacy to be a part of this patient's care.  [facility] Resident 04/02/2019 3:07 PM

## 2019-04-03 ENCOUNTER — Inpatient Hospital Stay: Payer: Medicare Other

## 2019-04-03 ENCOUNTER — Other Ambulatory Visit: Payer: Medicare Other

## 2019-04-03 DIAGNOSIS — R4182 Altered mental status, unspecified: Secondary | ICD-10-CM

## 2019-04-03 LAB — MAGNESIUM: Magnesium: 2.1 mg/dL (ref 1.7–2.4)

## 2019-04-03 LAB — CBC
HCT: 32.6 % — ABNORMAL LOW (ref 36.0–46.0)
Hemoglobin: 10.7 g/dL — ABNORMAL LOW (ref 12.0–15.0)
MCH: 30.7 pg (ref 26.0–34.0)
MCHC: 32.8 g/dL (ref 30.0–36.0)
MCV: 93.7 fL (ref 80.0–100.0)
Platelets: 201 10*3/uL (ref 150–400)
RBC: 3.48 MIL/uL — ABNORMAL LOW (ref 3.87–5.11)
RDW: 13.1 % (ref 11.5–15.5)
WBC: 15.6 10*3/uL — ABNORMAL HIGH (ref 4.0–10.5)
nRBC: 0 % (ref 0.0–0.2)

## 2019-04-03 LAB — LEGIONELLA PNEUMOPHILA SEROGP 1 UR AG: L. pneumophila Serogp 1 Ur Ag: NEGATIVE

## 2019-04-03 LAB — TRIGLYCERIDES: Triglycerides: 128 mg/dL (ref <150)

## 2019-04-03 LAB — BASIC METABOLIC PANEL
Anion gap: 8 (ref 5–15)
BUN: 11 mg/dL (ref 8–23)
CO2: 23 mmol/L (ref 22–32)
Calcium: 8.3 mg/dL — ABNORMAL LOW (ref 8.9–10.3)
Chloride: 110 mmol/L (ref 98–111)
Creatinine, Ser: 0.68 mg/dL (ref 0.44–1.00)
GFR calc Af Amer: >60 mL/min (ref >60)
GFR calc non Af Amer: >60 mL/min (ref >60)
Glucose, Bld: 130 mg/dL — ABNORMAL HIGH (ref 70–99)
Potassium: 3.5 mmol/L (ref 3.5–5.1)
Sodium: 141 mmol/L (ref 135–145)

## 2019-04-03 LAB — AMMONIA: Ammonia: 44 umol/L — ABNORMAL HIGH (ref 9–35)

## 2019-04-03 LAB — GLUCOSE, CAPILLARY
Glucose-Capillary: 106 mg/dL — ABNORMAL HIGH (ref 70–99)
Glucose-Capillary: 108 mg/dL — ABNORMAL HIGH (ref 70–99)

## 2019-04-03 LAB — HIV ANTIBODY (ROUTINE TESTING W REFLEX): HIV Screen 4th Generation wRfx: NONREACTIVE

## 2019-04-03 LAB — PHOSPHORUS: Phosphorus: 3 mg/dL (ref 2.5–4.6)

## 2019-04-03 IMAGING — DX DG CHEST 1V PORT
1 series · 1 of 1 positions shown · non-contrast
Comparison: Chest radiograph [DATE], [DATE]

CLINICAL DATA: Evaluate for pneumonia.

EXAM:
PORTABLE CHEST 1 VIEW

[chest ap]
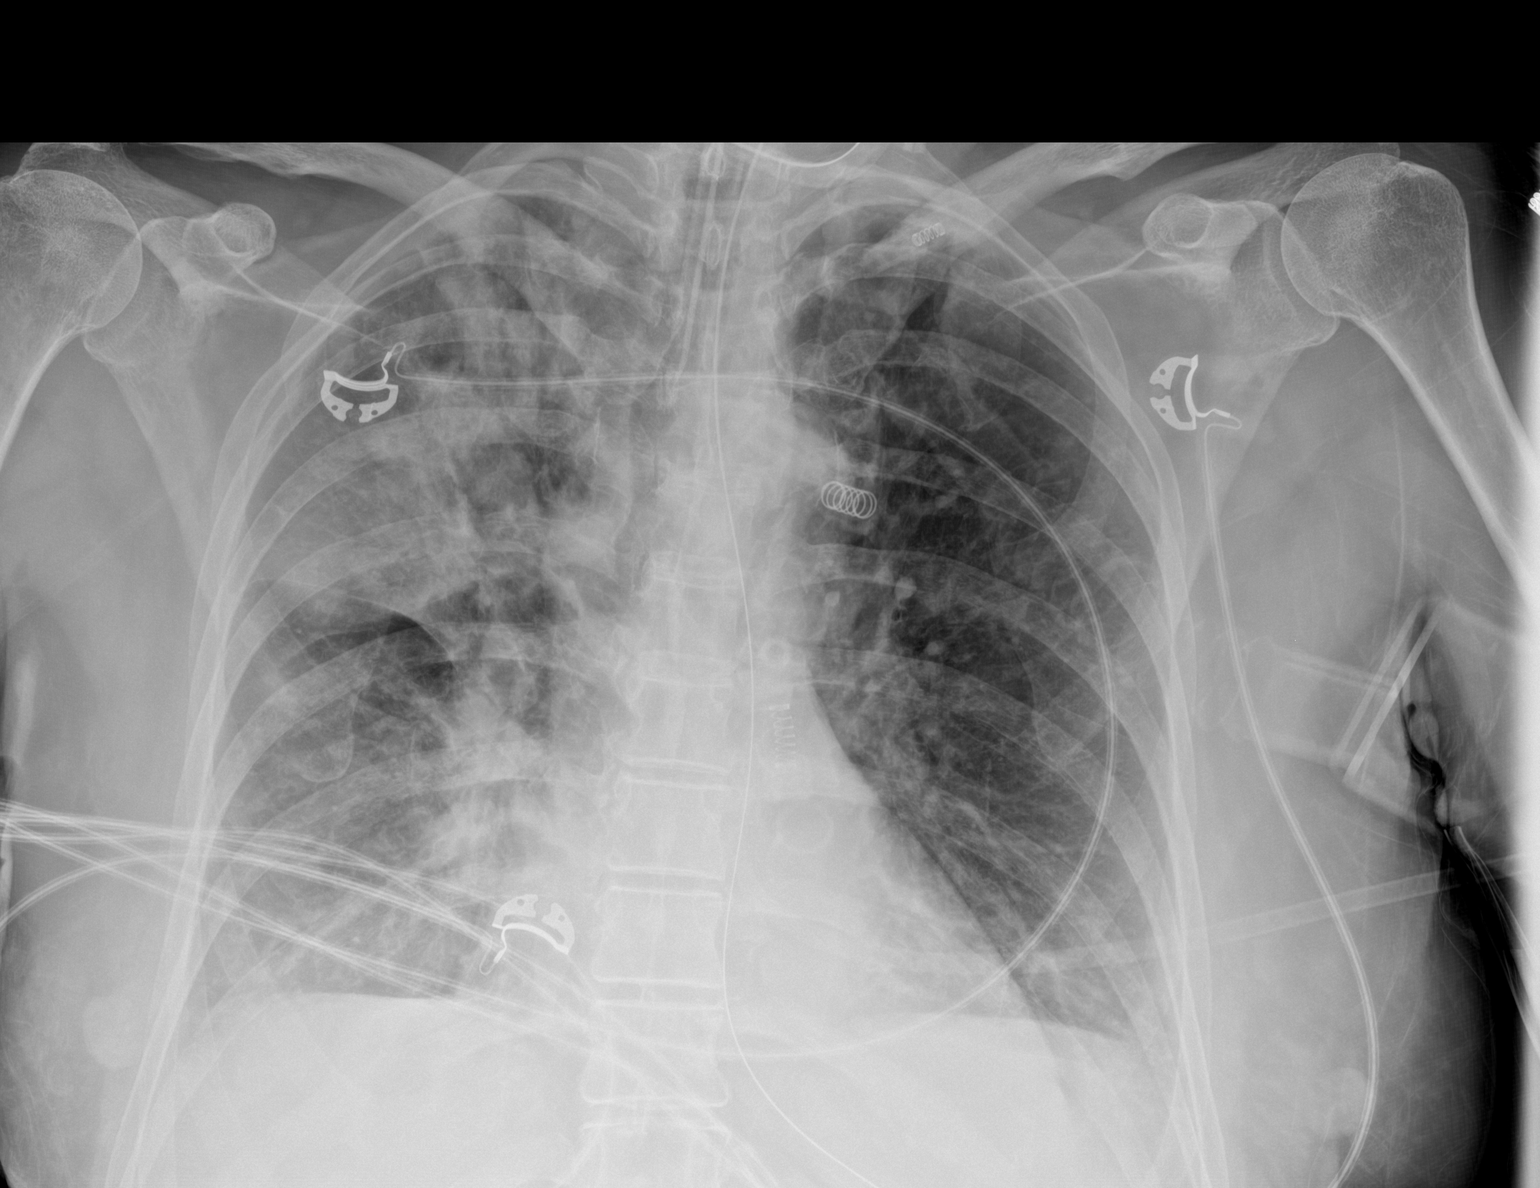

[1 of 1 positions shown; findings below may reference images not displayed]

FINDINGS: Stable cardiomediastinal contours. The endotracheal tip terminates
between the thoracic inlet and carina. Nasogastric tube courses
below the diaphragm with distal aspect out of field of view. No
pneumothorax or pleural effusion. There are persistent diffuse
right-sided heterogeneous pulmonary opacities. The left lung is
clear. Visualized bony skeleton is unremarkable.
IMPRESSION: Persistent diffuse right-sided heterogeneous pulmonary opacities
suspicious for infection.

## 2019-04-03 IMAGING — MR MR HEAD WO/W CM
13 series · 48 of 48 positions shown · IV contrast (gadavist)
Comparison: [DATE]

CLINICAL DATA: In cephalopathy.

EXAM:
MRI HEAD WITHOUT AND WITH CONTRAST
TECHNIQUE: Multiplanar, multiecho pulse sequences of the brain and surrounding
structures were obtained without and with intravenous contrast.
CONTRAST:  7mL GADAVIST GADOBUTROL 1 MMOL/ML IV SOLN

[Series 11: ax dwi_tracew · axial · 3.0mm · 0.60mm/px · z∈[-90,+61]mm · 4 of 48 slices shown]
[im 1/48]
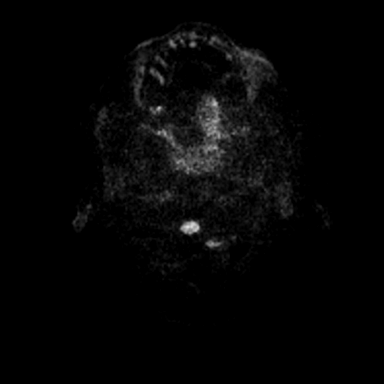
[im 16/48]
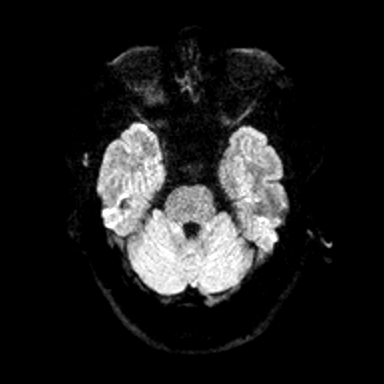
[im 32/48]
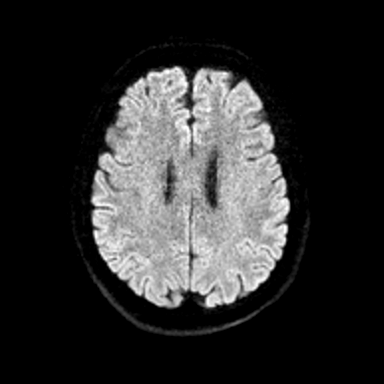
[im 48/48]
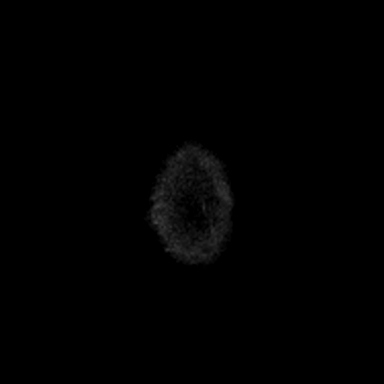

[Series 12: ax dwi_adc · axial · 3.0mm · 0.60mm/px · z∈[-90,+61]mm · 3 of 48 slices shown]
[im 1/48]
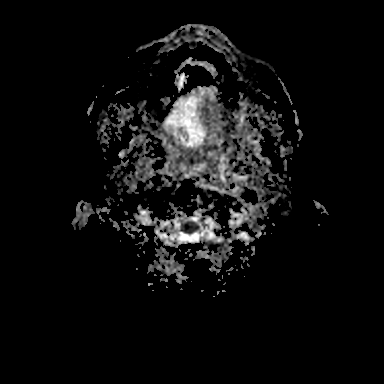
[im 24/48]
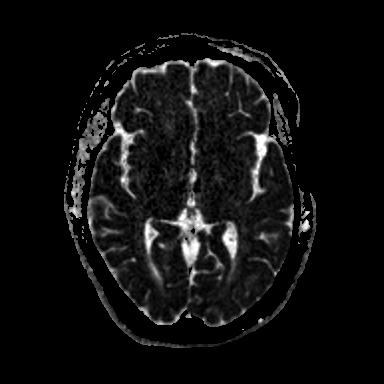
[im 48/48]
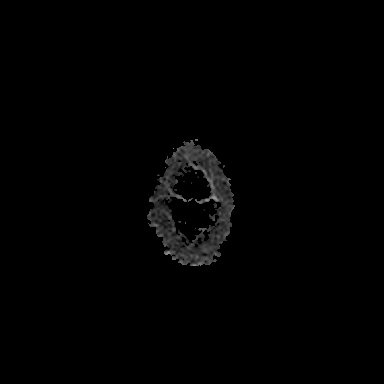

[Series 13: cor dwi_tracew · coronal · 5.0mm · 0.60mm/px · 2 of 40 slices shown]
[im 1/40]
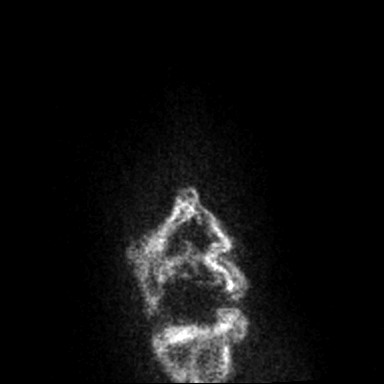
[im 40/40]
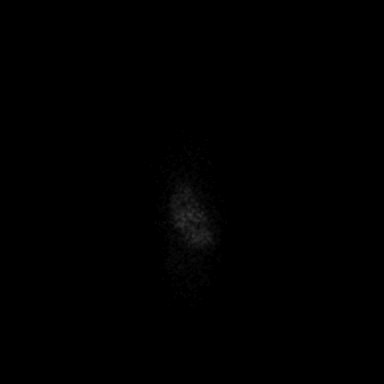

[Series 14: cor dwi_adc · coronal · 5.0mm · 0.60mm/px · 2 of 39 slices shown]
[im 1/39]
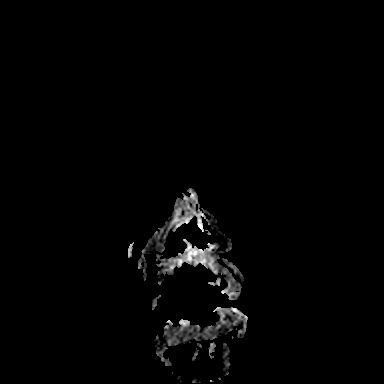
[im 39/39]
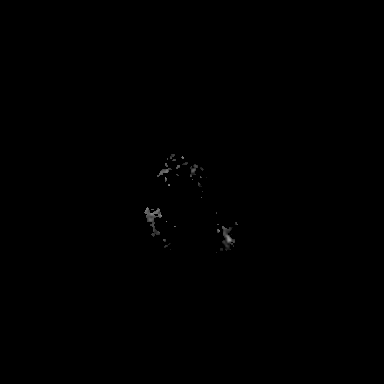

[Series 15: T1 · sagittal · 5.0mm · 0.62mm/px · 1 of 23 slices shown (1 of 2)]
[im 1/23]
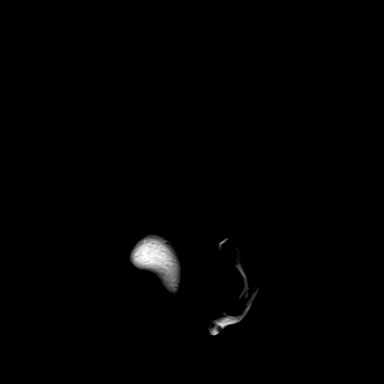

[Series 16: T2 · axial · 5.0mm · 0.53mm/px · 1 of 25 slices shown]
[im 1/25]
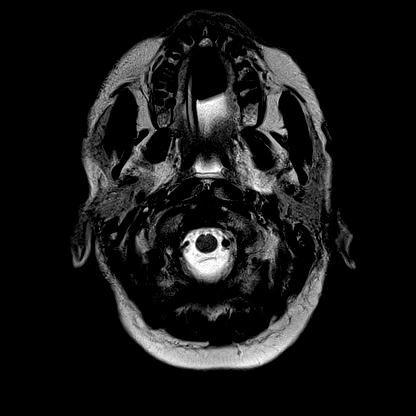

[Series 18: pha_images · axial · 3.0mm · 0.90mm/px · z∈[-102,+68]mm · 4 of 59 slices shown]
[im 1/59]
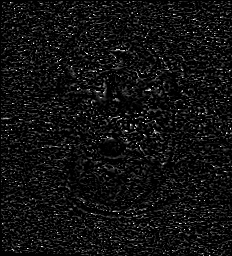
[im 20/59]
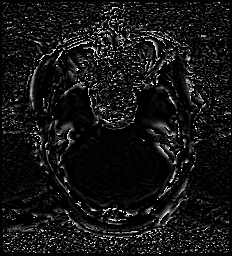
[im 39/59]
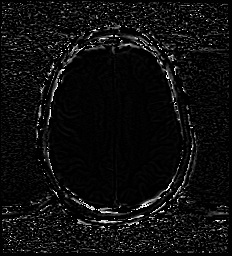
[im 59/59]
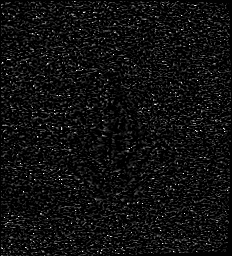

[Series 19: swi_images · axial · 3.0mm · 0.90mm/px · z∈[-102,+71]mm · 4 of 60 slices shown]
[im 1/60]
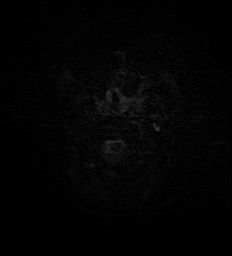
[im 20/60]
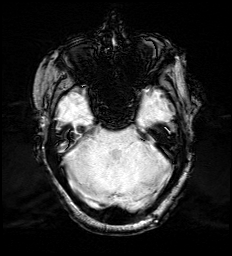
[im 40/60]
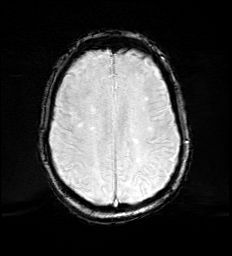
[im 60/60]
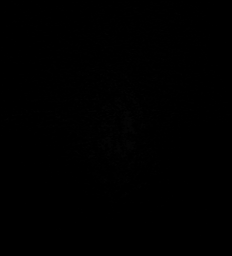

[Series 21: FLAIR · axial · 3.0mm · 0.53mm/px · z∈[-93,+65]mm · 3 of 55 slices shown]
[im 1/55]
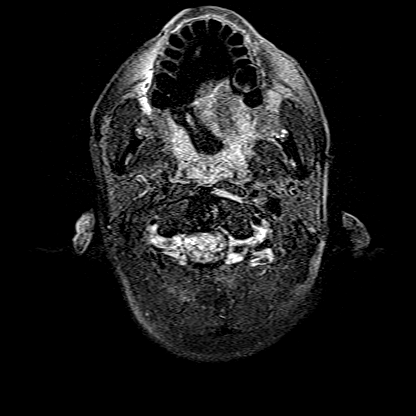
[im 28/55]
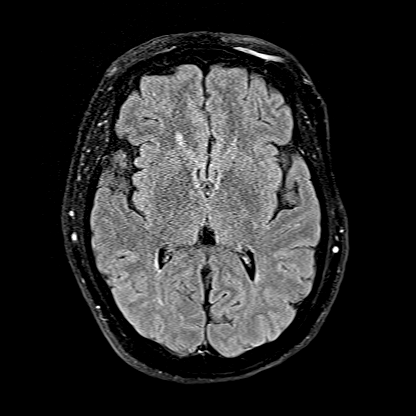
[im 55/55]
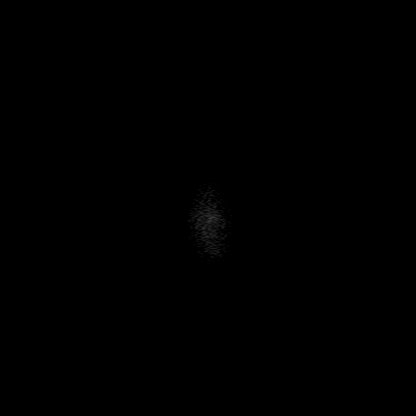

[Series 22: T1 · axial · 1.0mm · 0.98mm/px · z∈[-96,+74]mm · 10 of 176 slices shown (2 of 2)]
[im 1/176]
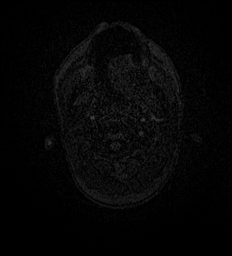
[im 20/176]
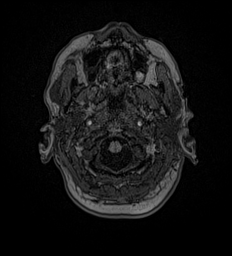
[im 39/176]
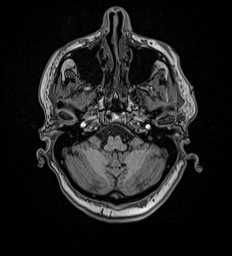
[im 59/176]
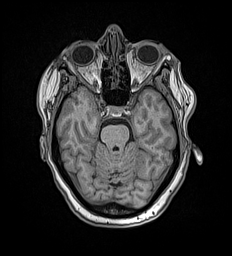
[im 78/176]
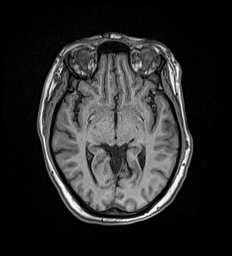
[im 98/176]
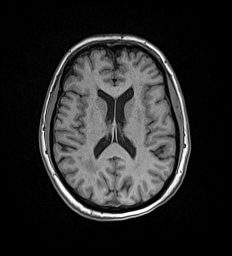
[im 117/176]
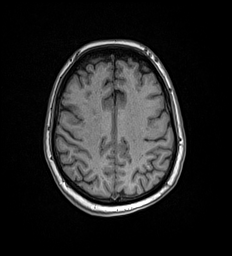
[im 137/176]
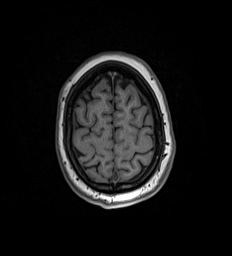
[im 156/176]
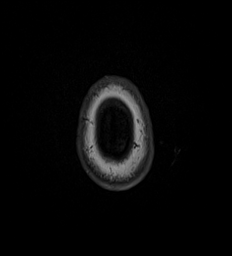
[im 176/176]
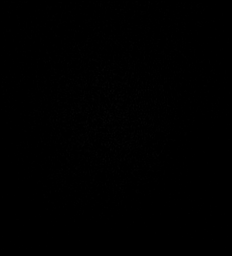

[Series 23: T2 post-contrast · coronal · 5.0mm · 0.57mm/px · 2 of 29 slices shown]
[im 1/29]
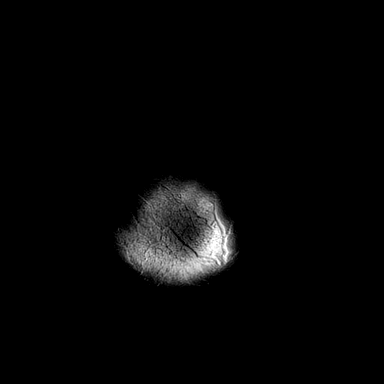
[im 29/29]
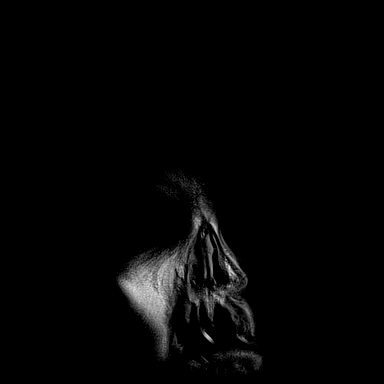

[Series 24: T1 post-contrast · axial · 1.0mm · 0.98mm/px · z∈[-98,+72]mm · 10 of 176 slices shown (1 of 2)]
[im 1/176]
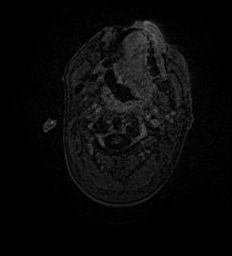
[im 20/176]
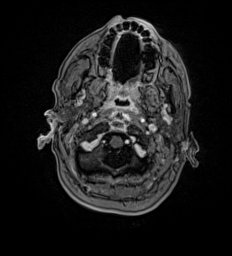
[im 39/176]
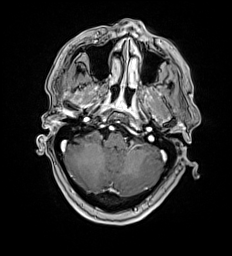
[im 59/176]
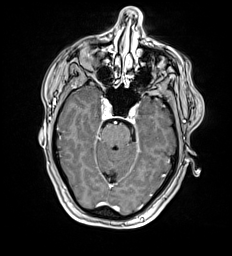
[im 78/176]
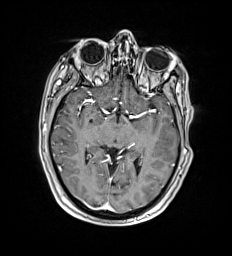
[im 98/176]
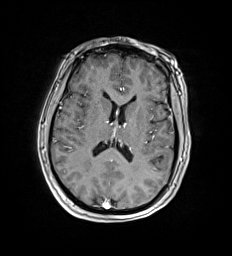
[im 117/176]
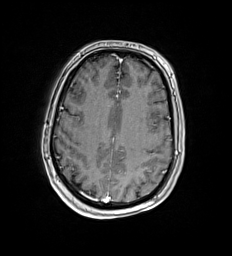
[im 137/176]
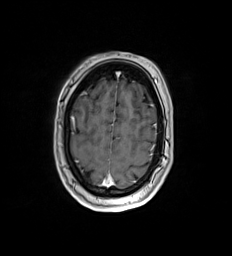
[im 156/176]
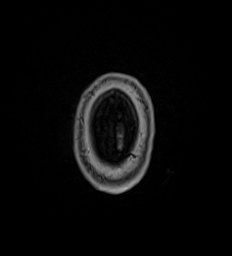
[im 176/176]
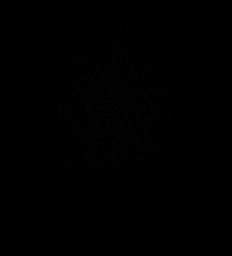

[Series 25: T1 post-contrast · coronal · 5.0mm · 0.86mm/px · 2 of 29 slices shown (2 of 2)]
[im 1/29]
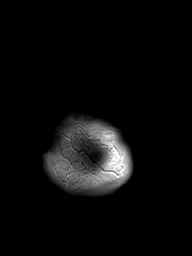
[im 29/29]
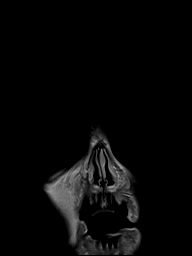

[48 of 48 positions shown; findings below may reference images not displayed]

FINDINGS: Brain: Diffusion imaging does not show any acute or subacute
infarction. The brainstem and cerebellum are normal. Cerebral
hemispheres show moderate chronic small-vessel ischemic changes of
the white matter. No cortical or large vessel territory infarction.
No mass lesion, hemorrhage, hydrocephalus or extra-axial collection.

Vascular: Major vessels at the base of the brain show flow.

Skull and upper cervical spine: Negative

Sinuses/Orbits: Clear/normal.

Other: Nasopharyngeal fluid related to intubation.
IMPRESSION: No change. No acute intracranial finding. Moderate chronic
small-vessel ischemic changes of the white matter.

## 2019-04-03 IMAGING — MR MR CERVICAL SPINE WO/W CM
5 of 8 series · 28 of 48 positions shown · IV contrast (gadavist)
Comparison: None.

CLINICAL DATA: Myelopathy.  Weakness of the extremities.

EXAM:
MRI CERVICAL SPINE WITHOUT AND WITH CONTRAST
TECHNIQUE: Multiplanar and multiecho pulse sequences of the cervical spine, to
include the craniocervical junction and cervicothoracic junction,
were obtained without and with intravenous contrast.
CONTRAST:  7mL GADAVIST GADOBUTROL 1 MMOL/ML IV SOLN

[Series 5: T2 · sagittal · 3.0mm · 0.62mm/px · 4 of 15 slices shown (1 of 2)]
[im 1/15]
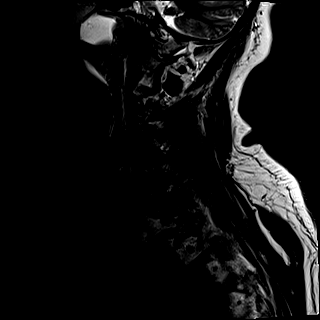
[im 5/15]
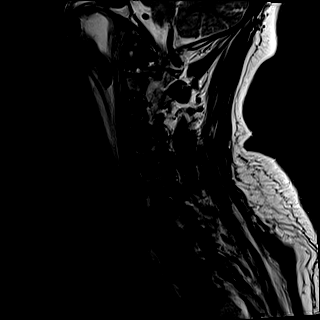
[im 10/15]
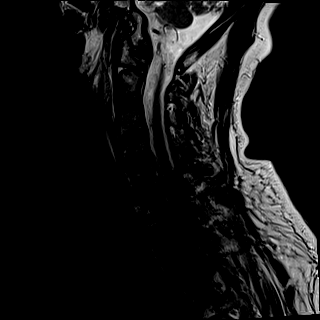
[im 15/15]
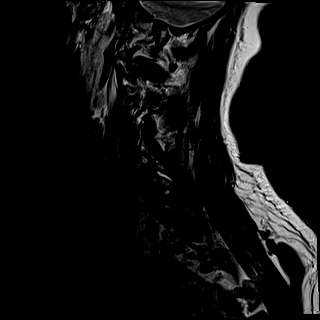

[Series 7: STIR · sagittal · 3.0mm · 0.62mm/px · 4 of 15 slices shown]
[im 1/15]
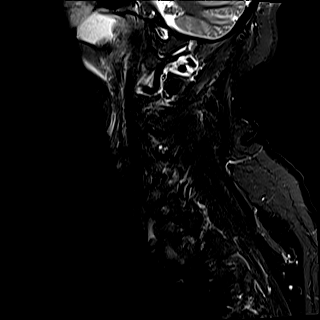
[im 5/15]
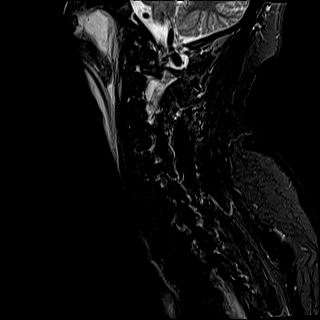
[im 10/15]
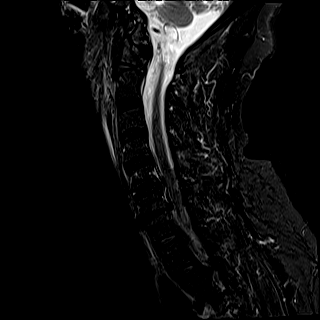
[im 15/15]
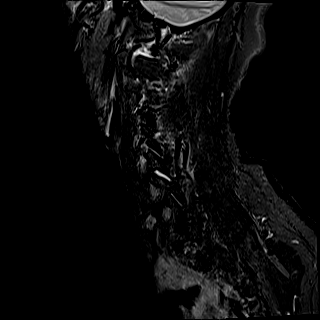

[Series 8: T2 · axial · 3.0mm · 0.70mm/px · z∈[-252,-152]mm · 8 of 31 slices shown (2 of 2)]
[im 1/31]
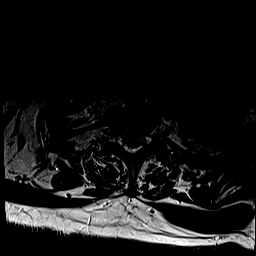
[im 5/31]
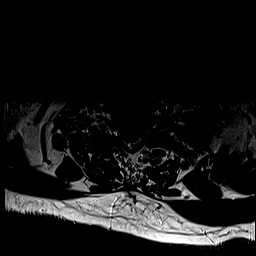
[im 9/31]
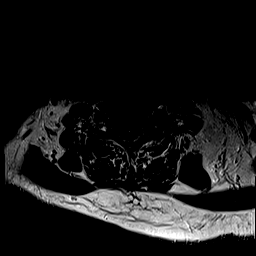
[im 13/31]
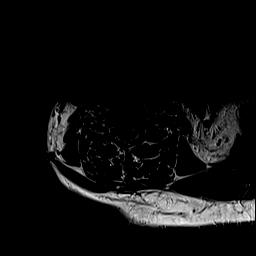
[im 18/31]
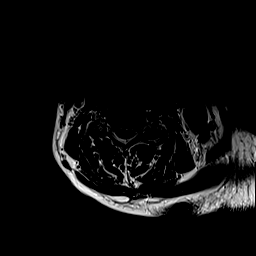
[im 22/31]
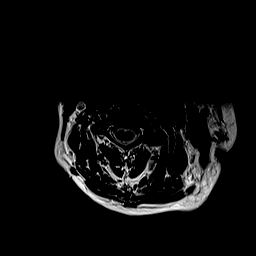
[im 26/31]
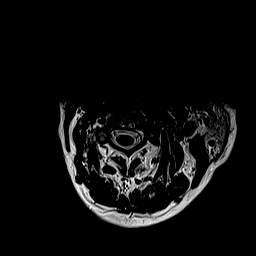
[im 31/31]
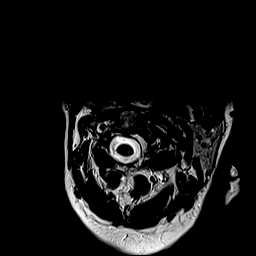

[Series 11: T1 · axial · non-contrast · 3.0mm · 0.35mm/px · z∈[-252,-152]mm · 8 of 31 slices shown]
[im 1/31]
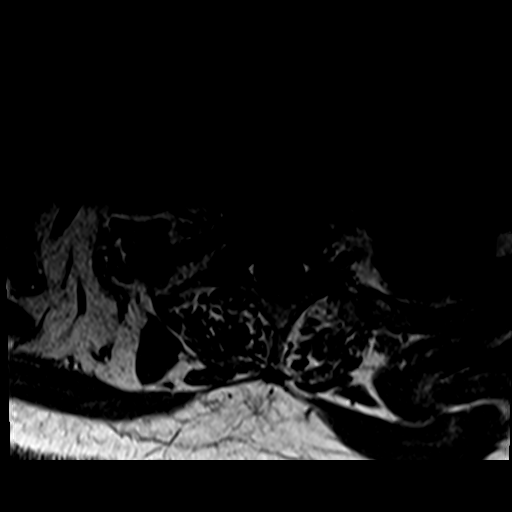
[im 5/31]
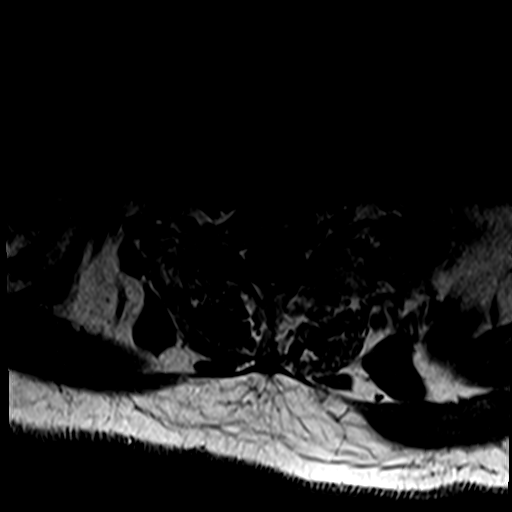
[im 9/31]
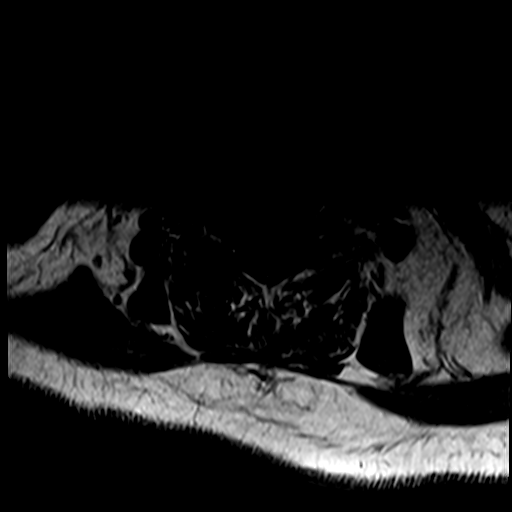
[im 13/31]
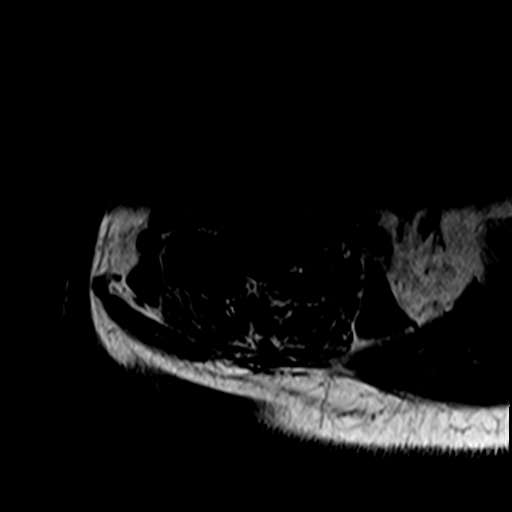
[im 18/31]
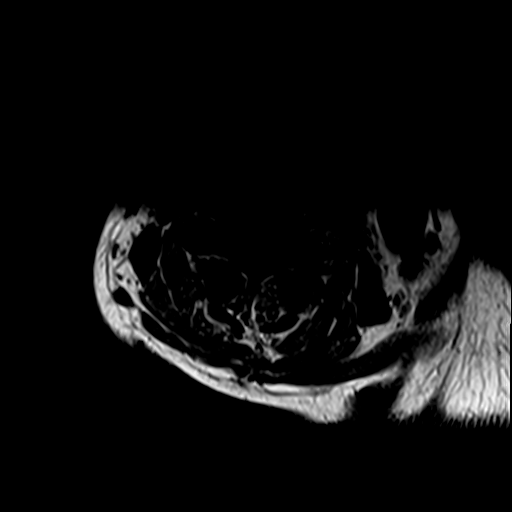
[im 22/31]
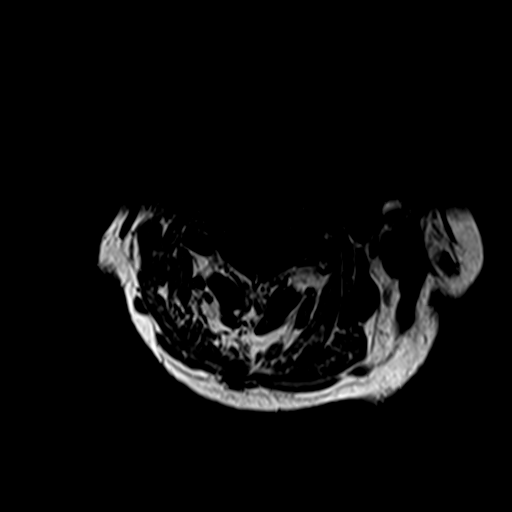
[im 26/31]
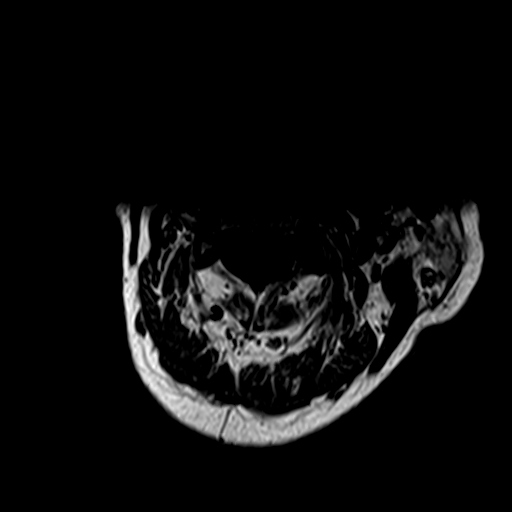
[im 31/31]
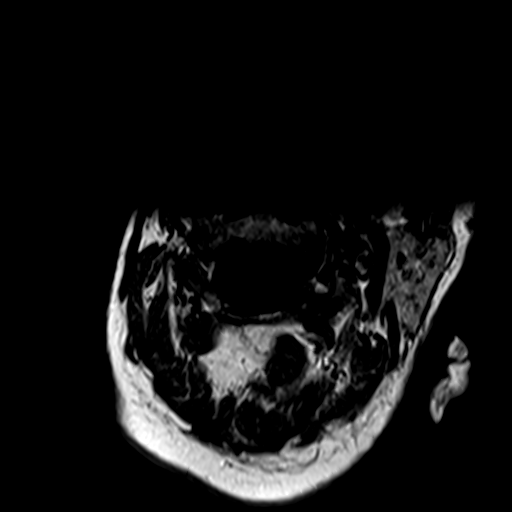

[Series 17: T1 post-contrast · axial · 3.0mm · 0.35mm/px · z∈[-266,-227]mm · 4 of 31 slices shown]
[im 1/31]
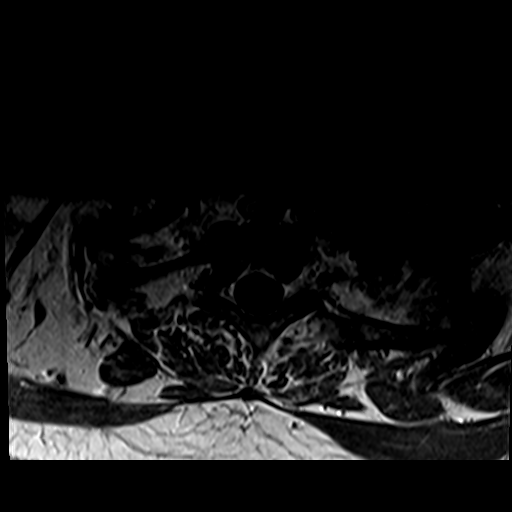
[im 5/31]
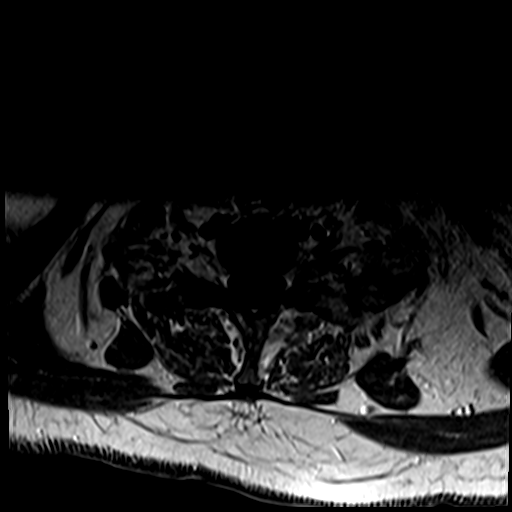
[im 9/31]
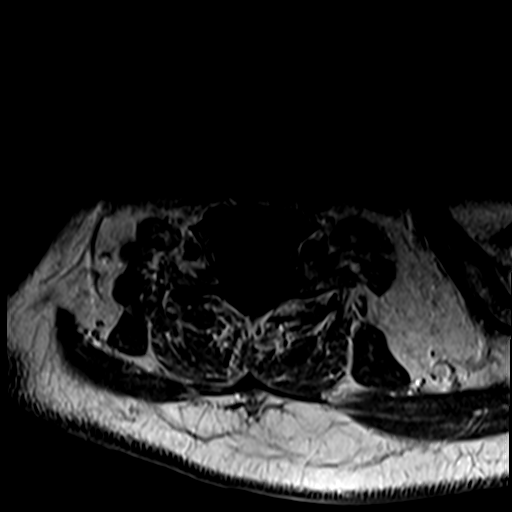
[im 13/31]
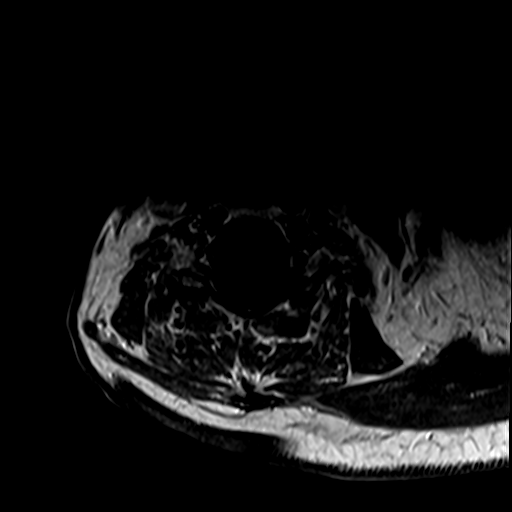

[28 of 48 positions shown; findings below may reference images not displayed]

FINDINGS: Alignment: Normal

Vertebrae: No fracture or primary bone lesion.

Cord: No primary cord lesion.  See below regarding stenosis.

Posterior Fossa, vertebral arteries, paraspinal tissues: Negative

Disc levels:

No abnormality at foramen magnum, C1-2 or C2-3.

C3-4: Uncovertebral hypertrophy and facet arthropathy on the left.
Left foraminal stenosis could affect the C4 nerve. No central canal
stenosis.

C4-5: Endplate osteophytes and mild bulging of the disc. Facet
arthropathy on the left. Mild left foraminal narrowing.

C5-6: Broad-based, right posterolateral predominant disc herniation
effaces the ventral subarachnoid space and indents the right side of
the cord. Proximal foraminal encroachment on the right. Facet
arthropathy on the right. Right foraminal narrowing could affect the
C6 nerve.

C6-7: Spondylosis with endplate osteophytes and shallow protrusion
of the disc. Narrowing the ventral subarachnoid space but no
compression of the cord. Bilateral foraminal narrowing.

C7-T1: No disc pathology.  Mild facet hypertrophy.

T1-2: Normal.

No significant enhancing pathology.
IMPRESSION: No primary cord pathology.

C3-4: Left foraminal stenosis due to encroachment by uncovertebral
osteophytes and facet arthropathy could affect the left C4 nerve.

C4-5: Mild left foraminal narrowing due to uncovertebral prominence
and facet degeneration, without definite compressive stenosis.

C5-6: Broad-based, right posterolateral predominant disc herniation
with effacement of the ventral subarachnoid space and slight
deformity of the right side of the cord. Facet arthropathy on the
right. Right foraminal stenosis could affect the right C6 nerve.

C6-7: Chronic degenerative spondylosis. Narrowing of the ventral
subarachnoid space but no compression of the cord. Bilateral
foraminal narrowing could affect either C7 nerve.

## 2019-04-03 MED ORDER — DEXMEDETOMIDINE HCL IN NACL 400 MCG/100ML IV SOLN
0.4000 ug/kg/h | INTRAVENOUS | Status: DC
  Administered 2019-04-03 – 2019-04-04 (×2): 0.4 ug/kg/h via INTRAVENOUS
  Administered 2019-04-05: 16:00:00 1 ug/kg/h via INTRAVENOUS
  Administered 2019-04-05: 0.8 ug/kg/h via INTRAVENOUS
  Administered 2019-04-05: 1 ug/kg/h via INTRAVENOUS
  Administered 2019-04-05: 0.6 ug/kg/h via INTRAVENOUS
  Administered 2019-04-06: 0.8 ug/kg/h via INTRAVENOUS
  Administered 2019-04-06: 18:00:00 0.702 ug/kg/h via INTRAVENOUS
  Administered 2019-04-06: 1 ug/kg/h via INTRAVENOUS
  Administered 2019-04-06: 21:00:00 0.8 ug/kg/h via INTRAVENOUS
  Administered 2019-04-06: 13:00:00 1.2 ug/kg/h via INTRAVENOUS
  Administered 2019-04-07: 02:00:00 0.8 ug/kg/h via INTRAVENOUS
  Administered 2019-04-07: 13:00:00 1.201 ug/kg/h via INTRAVENOUS
  Administered 2019-04-07: 1 ug/kg/h via INTRAVENOUS
  Administered 2019-04-07: 1.2 ug/kg/h via INTRAVENOUS
  Filled 2019-04-03 (×15): qty 100

## 2019-04-03 MED ORDER — POTASSIUM CHLORIDE 20 MEQ PO PACK
40.0000 meq | PACK | Freq: Once | ORAL | Status: AC
  Administered 2019-04-03: 40 meq
  Filled 2019-04-03: qty 2

## 2019-04-03 MED ORDER — GADOBUTROL 1 MMOL/ML IV SOLN
7.0000 mL | Freq: Once | INTRAVENOUS | Status: AC | PRN
  Administered 2019-04-03: 7 mL via INTRAVENOUS

## 2019-04-03 MED ORDER — LACTULOSE 10 GM/15ML PO SOLN
20.0000 g | Freq: Two times a day (BID) | ORAL | Status: DC
  Administered 2019-04-03 – 2019-04-05 (×5): 20 g via ORAL
  Filled 2019-04-03 (×5): qty 30

## 2019-04-03 NOTE — Progress Notes (Signed)
[facility] [place] NAME: Sonia Farrell    MR#:  [id number]  DATE OF BIRTH:  May 30, 1951  SUBJECTIVE:   Remains intubated this morning.  She is off sedation today, but  REVIEW OF SYSTEMS:  ROS- unable to obtain due to intubation.  DRUG ALLERGIES:   Allergies  Allergen Reactions  . Escitalopram Oxalate Other (See Comments)    MUSCLE SPASMS   VITALS:  Blood pressure (!) 149/75, pulse (!) 104, temperature 99.8 F (37.7 C), temperature source Axillary, resp. rate (!) 21, height 5\' 7"  (1.702 m), weight 78.6 kg, SpO2 94 %. PHYSICAL EXAMINATION:  Physical Exam  GENERAL:  Laying in the bed with no acute distress.  HEENT: Head atraumatic, normocephalic. Pupils equal, round, reactive to light and accommodation. No scleral icterus. Extraocular muscles intact. Oropharynx and nasopharynx clear. ETT in place NECK:  Supple, no jugular venous distention. No thyroid enlargement. LUNGS: +diminished breath sounds throughout all lung fields. No wheezes, crackles, rhonchi. No use of accessory muscles of respiration.  CARDIOVASCULAR: RRR, S1, S2 normal. No murmurs, rubs, or gallops.  ABDOMEN: Soft, nontender, nondistended. Bowel sounds present.  EXTREMITIES: No pedal edema, cyanosis, or clubbing.  NEUROLOGIC: intubated. Does not open eyes or follow commands. PSYCHIATRIC: intubated. SKIN: No obvious rash, lesion, or ulcer.  LABORATORY PANEL:  Female CBC Recent Labs  Lab 04/03/19 0413  WBC 15.6*  HGB 10.7*  HCT 32.6*  PLT 201   ------------------------------------------------------------------------------------------------------------------ Chemistries  Recent Labs  Lab 04/01/19 0547  04/03/19 0413  NA 142   < > 141  K 3.8   < > 3.5  CL 111   < > 110  CO2 20*   < > 23  GLUCOSE 121*   < > 130*  BUN 21   < > 11  CREATININE 0.92   < > 0.68  CALCIUM 7.7*   < > 8.3*  MG 1.7   < > 2.1  AST 72*  --   --   ALT 36  --   --   ALKPHOS 52  --    --   BILITOT 0.7  --   --    < > = values in this interval not displayed.   RADIOLOGY:  Dg Chest Port 1 View  Result Date: 04/03/2019 CLINICAL DATA:  Evaluate for pneumonia. EXAM: PORTABLE CHEST 1 VIEW COMPARISON:  Chest radiograph 04/02/2019, 04/01/2019 FINDINGS: Stable cardiomediastinal contours. The endotracheal tip terminates between the thoracic inlet and carina. Nasogastric tube courses below the diaphragm with distal aspect out of field of view. No pneumothorax or pleural effusion. There are persistent diffuse right-sided heterogeneous pulmonary opacities. The left lung is clear. Visualized bony skeleton is unremarkable. IMPRESSION: Persistent diffuse right-sided heterogeneous pulmonary opacities suspicious for infection. Electronically Signed   By: Audie Pinto M.D.   On: 04/03/2019 15:36   ASSESSMENT AND PLAN:   Acute hypoxic respiratory failure due to aspiration pneumonia- in the setting of possible intentional overdose with benzos. Remains intubated. -Vent management per CCM -Continue unasyn -Respiratory culture ordered  Sepsis due to aspiration pneumonia- meeting sepsis criteria on admission with fever, tachycardia, and tachypnea. Procalcitonin elevated. Sepsis is resolving. -Trend lactic acid -Continue IVFs -Blood cultures with no growth to date  Altered mental status due to unclear etiology- possible benzo overdose vs seizures -Continue keppra -Neurology following -MRI brain unremarkable- neurology planning on repeating MRI brain and c-spine today -EEG pending  All the records are reviewed and case discussed with Care Management/Social  Worker. Management plans discussed with the patient, family and they are in agreement.  CODE STATUS: Full Code  TOTAL TIME TAKING CARE OF THIS PATIENT: 36 minutes.   More than 50% of the time was spent in counseling/coordination of care: YES  POSSIBLE D/C unknown, DEPENDING ON CLINICAL CONDITION.   Jinny BlossomKaty D  M.D on  04/03/2019 at 4:17 PM  Between 7am to 6pm - Pager - [phone]  After 6pm go to [email or web address] - Social research officer, governmentpassword EPAS [facility]  [facility]  Office  [phone]  CC: Primary care physician; Jerl MinaHedrick, James, MD  Note: This dictation was prepared with Dragon dictation along with smaller phrase technology. Any transcriptional errors that result from this process are unintentional.

## 2019-04-03 NOTE — Progress Notes (Signed)
9/15: No major events overnight Neuro exam: Patient intubated, none verbal will keep eye open . She is less interactive than yeterday. She has a forward gaze, she does not track Motor exam: she is flaccid in her Uex, will move her LEX but more on the left leg. She will have grimaces to painful stimuli  Recs: Continue Neuro protective measures including normothermia, normoglycemia, correct electrolytes/metaboic abnlites Continue Keprra Will f/up on EEG Seizure precautions. Ativan prn for sz. M concerned about her neuro exam this AM. I will order a repeat MRI of brain and cervical spine with diffusion w and w/o. Remaining of medcial treatment by primary team   9/14: MRI On 9/13No abnormality seen to explain the clinical presentation. No acute or subacute infarction or any reversible finding. Moderate chronic small-vessel ischemic changes the cerebral hemispheric white matter.  EEG pending  Neuro exam: Intubated, on no sedation Mental Status: Patient would close eyes and open eyes intermittently to commands.  Grimaces to deep sternal rub.  Does not follow commands.  No verbalizations noted.  Cranial Nerves: II: patient does not respond confrontation bilaterally, pupils right 3 mm, left 3 mm,and reactive bilaterally III,IV,VI: Oculocephalic response present bilaterally.  V,VII: corneal reflex present bilaterally  VIII: patient does not respond to verbal stimuli IX,X: gag reflex unable to be tested, XI: trapezius strength unable to test bilaterally XII: tongue strength unable to test Motor: Will withdraw to painful stimuli on LLEx not UEX Sensory: Grimaces to painfull stimuli.  Deep Tendon Reflexes:  3+ throughout. Plantars: Mute bilaterally Cerebellar: Unable to perform  Neuro protective measures including normothermia, normoglycemia, correct electrolytes/metaboic abnlites Continue Keprra Will f/up on EEG Seizure precautions. Ativan prn for sz.    HPI: Sonia B  Farrell is an 68 y.o. female who is intubated and unable to provide any history therefore all history obtained from the chart.  Patient with a known history of hypothyroidism who presents via EMS due to unresponsiveness.  Apparently patient was found unresponsive this morning by family.  Patient has had depression since her husband passed away a few weeks ago.  Patient has been taking an SSRI.  No report of suicidal ideations.  Patient was intubated due to inability to protect airway.  She is found to have pneumonia and started on antibiotics while in the emergency room.  Today noted to have eyes deviated upward with some unususal eye movements at times concerning for seizure.    Past Medical History:  Diagnosis Date  . Arthritis    fingers  . GERD (gastroesophageal reflux disease)   . Hyperlipidemia   . Hypothyroidism     Past Surgical History:  Procedure Laterality Date  . CATARACT EXTRACTION W/PHACO Left 12/26/2018   Procedure: CATARACT EXTRACTION PHACO AND INTRAOCULAR LENS PLACEMENT ([place]) LEFT;  Surgeon: Birder Robson, MD;  Location: [facility];  Service: Ophthalmology;  Laterality: Left;  . CATARACT EXTRACTION W/PHACO Right 01/23/2019   Procedure: CATARACT EXTRACTION PHACO AND INTRAOCULAR LENS PLACEMENT ([place]) RIGHT;  Surgeon: Birder Robson, MD;  Location: [facility];  Service: Ophthalmology;  Laterality: Right;  . COLONOSCOPY      Family History  Problem Relation Age of Onset  . Breast cancer Neg Hx     Social History:  reports that she has never smoked. She has never used smokeless tobacco. She reports current alcohol use of about 1.0 standard drinks of alcohol per week. No history on file for drug.  Allergies  Allergen Reactions  . Escitalopram Oxalate Other (See Comments)  MUSCLE SPASMS    Medications:  I have reviewed the patient's current medications. Prior to Admission:  Medications Prior to Admission  Medication Sig Dispense Refill Last Dose  .  ALPRAZolam (XANAX) 0.5 MG tablet Take 0.5 mg by mouth as needed for anxiety.   Past Week at Unknown time  . levothyroxine (SYNTHROID) 75 MCG tablet Take 75 mcg by mouth daily before breakfast.   Past Week at Unknown time  . pantoprazole (PROTONIX) 40 MG tablet Take 40 mg by mouth daily as needed.   Past Week at Unknown time  . PARoxetine (PAXIL) 10 MG tablet Take 10 mg by mouth daily.   Past Week at Unknown time  . rosuvastatin (CRESTOR) 10 MG tablet Take 10 mg by mouth daily.   Past Week at Unknown time  . acetaminophen (TYLENOL) 325 MG tablet Take 650 mg by mouth every 6 (six) hours as needed.   prn at prn   Scheduled: . chlorhexidine gluconate (MEDLINE KIT)  15 mL Mouth Rinse BID  . Chlorhexidine Gluconate Cloth  6 each Topical Daily  . enoxaparin (LOVENOX) injection  40 mg Subcutaneous Daily  . famotidine  20 mg Per Tube QHS  . feeding supplement (PRO-STAT SUGAR FREE 64)  30 mL Per Tube BID  . folic acid  1 mg Per Tube Daily  . free water  100 mL Per Tube Q8H  . lactulose  20 g Oral BID  . mouth rinse  15 mL Mouth Rinse 10 times per day  . multivitamin  15 mL Per Tube Daily  . thiamine  100 mg Per Tube Daily    ROS: Unable to provide  Physical Examination: Blood pressure 139/70, pulse 98, temperature 99.8 F (37.7 C), temperature source Axillary, resp. rate (!) 25, height 5' 7" (1.702 m), weight 78.6 kg, SpO2 96 %.  HEENT-  Normocephalic, no lesions, without obvious abnormality.  Normal external eye and conjunctiva.  Normal TM's bilaterally.  Normal auditory canals and external ears. Normal external nose, mucus membranes and septum.  Normal pharynx. Cardiovascular- S1, S2 normal, pulses palpable throughout   Lungs- chest clear, no wheezing, rales, normal symmetric air entry Abdomen- soft, non-tender; bowel sounds normal; no masses,  no organomegaly Extremities- no edema Lymph-no adenopathy palpable Musculoskeletal-no joint tenderness, deformity or swelling Skin-warm and dry, no  hyperpigmentation, vitiligo, or suspicious lesions  Neurological Examination   Mental Status: Patient does not respond to verbal stimuli.  Grimaces to deep sternal rub.  Does not follow commands.  No verbalizations noted.  Cranial Nerves: II: patient does not respond confrontation bilaterally, pupils right 3 mm, left 3 mm,and reactive bilaterally III,IV,VI: Oculocephalic response present bilaterally.  V,VII: corneal reflex present bilaterally  VIII: patient does not respond to verbal stimuli IX,X: gag reflex unable to be tested, XI: trapezius strength unable to test bilaterally XII: tongue strength unable to test Motor: Extremities flaccid throughout.  No spontaneous movement noted.  No purposeful movements noted. Sensory: Grimaces with painful stimuli in all extremities.  Some withdrawal noted in the lower extremities with painful stimuli Deep Tendon Reflexes:  3+ throughout. Plantars: Mute bilaterally Cerebellar: Unable to perform   Laboratory Studies:   Basic Metabolic Panel: Recent Labs  Lab 03/29/2019 1112 04/01/19 0547 04/02/19 0501 04/03/19 0413  NA 140 142 140 141  K 4.4 3.8 3.5 3.5  CL 107 111 110 110  CO2 21* 20* 23 23  GLUCOSE 148* 121* 137* 130*  BUN _0 CREATININE 1.09* 0.92 0.84 0.68  CALCIUM 8.2* 7.7* 7.9* 8.3*  MG  --  1.7 2.1 2.1  PHOS  --  2.3* 1.6* 3.0    Liver Function Tests: Recent Labs  Lab 03/28/2019 1112 04/01/19 0547  AST 42* 72*  ALT 22 36  ALKPHOS 70 52  BILITOT 0.8 0.7  PROT 7.4 5.8*  ALBUMIN 4.0 2.9*   No results for input(s): LIPASE, AMYLASE in the last 168 hours. Recent Labs  Lab 04/03/19 0509  AMMONIA 44*    CBC: Recent Labs  Lab 03/24/2019 1112 04/01/19 0547 04/02/19 0501 04/03/19 0413  WBC 8.2 8.6 12.7* 15.6*  NEUTROABS 7.1  --   --   --   HGB 14.7 12.6 11.2* 10.7*  HCT 44.5 38.9 33.7* 32.6*  MCV 93.9 95.3 94.1 93.7  PLT 274 203 194 201    Cardiac Enzymes: No results for input(s): CKTOTAL, CKMB,  CKMBINDEX, TROPONINI in the last 168 hours.  BNP: Invalid input(s): POCBNP  CBG: Recent Labs  Lab 04/01/19 1120 04/01/19 2329 04/02/19 0418 04/02/19 2349 04/03/19 0533  GLUCAP 107* 105* 105* 123* 106*    Microbiology: Results for orders placed or performed during the hospital encounter of 03/28/2019  Urine culture     Status: None   Collection Time: 03/27/2019 11:12 AM   Specimen: Urine, Random  Result Value Ref Range Status   Specimen Description   Final    URINE, RANDOM Performed at [facility], [address] [place], [place] [address]    Special Requests   Final    NONE Performed at [facility], [address], [place], [place] [address]    Culture   Final    NO GROWTH Performed at [facility], [place] [address], [place], [place] [address]    Report Status 04/02/2019 FINAL  Final  Blood Cultures (routine x 2)     Status: None (Preliminary result)   Collection Time: 04/04/2019 11:38 AM   Specimen: BLOOD  Result Value Ref Range Status   Specimen Description BLOOD BLOOD RIGHT HAND  Final   Special Requests   Final    BOTTLES DRAWN AEROBIC AND ANAEROBIC Blood Culture results may not be optimal due to an excessive volume of blood received in culture bottles   Culture   Final    NO GROWTH 3 DAYS Performed at [facility], [address] [place], [place] [address]    Report Status PENDING  Incomplete  SARS Coronavirus 2 [facility] order, Performed in [place] hospital lab) Nasopharyngeal     Status: None   Collection Time: 03/26/2019 11:38 AM   Specimen: Nasopharyngeal  Result Value Ref Range Status   SARS Coronavirus 2 NEGATIVE NEGATIVE Final    Comment: (NOTE) If result is NEGATIVE SARS-CoV-2 target nucleic acids are NOT DETECTED. The SARS-CoV-2 RNA is generally detectable in upper and lower  respiratory specimens during the acute phase of infection. The lowest  concentration of SARS-CoV-2 viral copies this assay can detect is  250  copies / mL. A negative result does not preclude SARS-CoV-2 infection  and should not be used as the sole basis for treatment or other  patient management decisions.  A negative result may occur with  improper specimen collection / handling, submission of specimen other  than nasopharyngeal swab, presence of viral mutation(s) within the  areas targeted by this assay, and inadequate number of viral copies  (<250 copies / mL). A negative result must be combined with clinical  observations, patient history, and epidemiological  information. If result is POSITIVE SARS-CoV-2 target nucleic acids are DETECTED. The SARS-CoV-2 RNA is generally detectable in upper and lower  respiratory specimens dur ing the acute phase of infection.  Positive  results are indicative of active infection with SARS-CoV-2.  Clinical  correlation with patient history and other diagnostic information is  necessary to determine patient infection status.  Positive results do  not rule out bacterial infection or co-infection with other viruses. If result is PRESUMPTIVE POSTIVE SARS-CoV-2 nucleic acids MAY BE PRESENT.   A presumptive positive result was obtained on the submitted specimen  and confirmed on repeat testing.  While 2019 novel coronavirus  (SARS-CoV-2) nucleic acids may be present in the submitted sample  additional confirmatory testing may be necessary for epidemiological  and / or clinical management purposes  to differentiate between  SARS-CoV-2 and other Sarbecovirus currently known to infect humans.  If clinically indicated additional testing with an alternate test  methodology [id number]) is advised. The SARS-CoV-2 RNA is generally  detectable in upper and lower respiratory sp ecimens during the acute  phase of infection. The expected result is Negative. Fact Sheet for Patients:  StrictlyIdeas.no Fact Sheet for Healthcare  Providers: BankingDealers.co.za This test is not yet approved or cleared by the [place] FDA and has been authorized for detection and/or diagnosis of SARS-CoV-2 by FDA under an Emergency Use Authorization (EUA).  This EUA will remain in effect (meaning this test can be used) for the duration of the COVID-19 declaration under Section 564(b)(1) of the Act, 21 U.S.C. section 360bbb-3(b)(1), unless the authorization is terminated or revoked sooner. Performed at [facility], [address] [place], [place] [address]   Blood Cultures (routine x 2)     Status: None (Preliminary result)   Collection Time: 03/29/2019 11:39 AM   Specimen: BLOOD  Result Value Ref Range Status   Specimen Description BLOOD BLOOD RIGHT FOREARM  Final   Special Requests   Final    BOTTLES DRAWN AEROBIC AND ANAEROBIC Blood Culture adequate volume   Culture   Final    NO GROWTH 3 DAYS Performed at [facility], [address], [place], [place] [address]    Report Status PENDING  Incomplete  MRSA PCR Screening     Status: None   Collection Time: 04/07/2019  5:46 PM   Specimen: Nasopharyngeal  Result Value Ref Range Status   MRSA by PCR NEGATIVE NEGATIVE Final    Comment:        The GeneXpert MRSA Assay (FDA approved for NASAL specimens only), is one component of a comprehensive MRSA colonization surveillance program. It is not intended to diagnose MRSA infection nor to guide or monitor treatment for MRSA infections. Performed at [facility], [address] [place], [place] [address]   Culture, respiratory     Status: None (Preliminary result)   Collection Time: 04/02/19  4:50 PM   Specimen: SPU  Result Value Ref Range Status   Specimen Description   Final    SPUTUM Performed at [facility], [address], [place], [place] [address]    Special Requests   Final    NONE Performed at [facility] [address]  [place], [place] [address]    Gram Stain   Final    ABUNDANT WBC PRESENT, PREDOMINANTLY PMN NO SQUAMOUS EPITHELIAL CELLS SEEN FEW GRAM POSITIVE COCCI IN CLUSTERS    Culture   Final    CULTURE REINCUBATED FOR BETTER GROWTH Performed at [facility], [place] [address], [place], [place] [address]    Report Status PENDING  Incomplete    Coagulation Studies: No results for input(s): LABPROT, INR in the last 72 hours.  Urinalysis:  Recent Labs  Lab 04/14/2019 1112  COLORURINE YELLOW*  LABSPEC 1.012  PHURINE 5.0  GLUCOSEU NEGATIVE  HGBUR LARGE*  BILIRUBINUR NEGATIVE  KETONESUR NEGATIVE  PROTEINUR NEGATIVE  NITRITE NEGATIVE  LEUKOCYTESUR NEGATIVE    Lipid Panel:     Component Value Date/Time   TRIG 128 04/03/2019 0419    HgbA1C: No results found for: HGBA1C  Urine Drug Screen:      Component Value Date/Time   LABOPIA NONE DETECTED 04/09/2019 1112   COCAINSCRNUR NONE DETECTED 03/21/2019 1112   LABBENZ POSITIVE (A) 03/30/2019 1112   AMPHETMU NONE DETECTED 03/28/2019 1112   THCU NONE DETECTED 04/07/2019 1112   LABBARB NONE DETECTED 04/14/2019 1112    Alcohol Level:  Recent Labs  Lab 04/02/2019 1138  ETH <10    Other results: EKG: sinus tachycardia at 120 bpm.  Imaging: Mr Brain Wo Contrast  Result Date: 04/01/2019 CLINICAL DATA:  Acute presentation unresponsive. EXAM: MRI HEAD WITHOUT CONTRAST TECHNIQUE: Multiplanar, multiecho pulse sequences of the brain and surrounding structures were obtained without intravenous contrast. COMPARISON:  Head CT 03/21/2019 FINDINGS: Brain: Diffusion imaging does not show any acute or subacute infarction. Brainstem and cerebellum are normal. Cerebral hemispheres show moderate changes of chronic small vessel disease affecting the deep and subcortical white matter. No cortical or large vessel territory infarction. No mass lesion, hemorrhage, hydrocephalus or extra-axial collection. Vascular: Major vessels at the base of the brain show flow.  Skull and upper cervical spine: Negative Sinuses/Orbits: Clear/normal Other: None IMPRESSION: No abnormality seen to explain the clinical presentation. No acute or subacute infarction or any reversible finding. Moderate chronic small-vessel ischemic changes the cerebral hemispheric white matter. Electronically Signed   By: Nelson Chimes M.D.   On: 04/01/2019 14:55   Dg Chest Port 1 View  Result Date: 04/02/2019 CLINICAL DATA:  Respiratory failure. EXAM: PORTABLE CHEST 1 VIEW COMPARISON:  Radiograph of April 01, 2019. FINDINGS: Endotracheal and nasogastric tubes are unchanged in position. No pneumothorax is noted. No significant pleural effusion is noted. Left lung is clear. Stable right upper and lower lobe airspace opacities are noted concerning for pneumonia. Bony thorax is unremarkable. IMPRESSION: Stable right lung opacities are noted concerning for pneumonia. Stable support apparatus. Electronically Signed   By: Marijo Conception M.D.   On: 04/02/2019 07:46

## 2019-04-03 NOTE — Progress Notes (Addendum)
Name: Sonia Farrell MRN: [id number] DOB: September 30, 1950    ADMISSION DATE:  30-Aug-2018 CONSULTATION DATE:  04/03/2019  CHIEF COMPLAINT:  Acute hypoxic respiratory failure  BRIEF PATIENT DESCRIPTION: Sonia Farrell is a 68 year old female with history of depression, hyperlipidemia, and hypothyroidism who presented to the ED in acute hypoxic respiratory failure where she was intubated after possible intentional drug overdose.  SIGNIFICANT EVENTS/STUDIES 9/12 admitted to ICU via ED, intubated and unresponsive 9/12 CXR suspicious for right lobe CAP, Lactic acid elevated 9/12 Possible seizure noted, CT head showed no intracranial abnormalities 9/13 MRI brain showed no abnormalities 9/15 Remains minimally responsive and intubated without sedation  HISTORY OF PRESENT ILLNESS: History obtained from family member. Sonia Farrell was found 9/12 unresponsive with a bottle of wine and drug bottles around her. Her husband died 3 weeks prior which exacerbated her depression. She was brought to the ED, intubated and transferred to the ICU. CT and MRI showed no abnormalities. Pt was taken off all sedation, but remains minimally responsive, opening eyes to voice but staring blankly.  PAST MEDICAL HISTORY :   has a past medical history of Arthritis, GERD (gastroesophageal reflux disease), Hyperlipidemia, and Hypothyroidism.  has a past surgical history that includes Colonoscopy; Cataract extraction w/PHACO (Left, 12/26/2018); and Cataract extraction w/PHACO (Right, 01/23/2019). Prior to Admission medications   Medication Sig Start Date End Date Taking? Authorizing Provider  ALPRAZolam Prudy Feeler(XANAX) 0.5 MG tablet Take 0.5 mg by mouth as needed for anxiety.   Yes [provider]  levothyroxine (SYNTHROID) 75 MCG tablet Take 75 mcg by mouth daily before breakfast.   Yes [provider]  pantoprazole (PROTONIX) 40 MG tablet Take 40 mg by mouth daily as needed.   Yes [provider]   PARoxetine (PAXIL) 10 MG tablet Take 10 mg by mouth daily.   Yes [provider]  rosuvastatin (CRESTOR) 10 MG tablet Take 10 mg by mouth daily.   Yes [provider]  acetaminophen (TYLENOL) 325 MG tablet Take 650 mg by mouth every 6 (six) hours as needed.    [provider]   Allergies  Allergen Reactions  . Escitalopram Oxalate Other (See Comments)    MUSCLE SPASMS   REVIEW OF SYSTEMS:   Unable to obtain because patient is intubated and minimally responsive.  SUBJECTIVE:  Unable to obtain because patient is intubated and minimally responsive.  VITAL SIGNS: Temp:  [98.5 F (36.9 C)-100.2 F (37.9 C)] 98.5 F (36.9 C) (09/15 0800) Pulse Rate:  [89-109] 98 (09/15 0800) Resp:  [17-39] 22 (09/15 0800) BP: (128-158)/(66-80) 137/72 (09/15 0800) SpO2:  [86 %-97 %] 96 % (09/15 0800) FiO2 (%):  [28 %-40 %] 40 % (09/15 0759)  PHYSICAL EXAMINATION: General:  Intubated, RASS -3, opens eyes to voice, no eye contact. Neuro:  PERRL, forward gaze, minimal spontaneous movement. Withdrawal from painful stimuli. HEENT:  Normocephalic, atraumatic Cardiovascular:  Regular rate and rhythm, no murmurs. No edema noted, strong distal pulses Lungs:  Expiratory wheezes in R lobe Abdomen:  Soft, non-distended Skin:  Warm, dry, intact  Recent Labs  Lab 04/01/19 0547 04/02/19 0501 04/03/19 0413  NA 142 140 141  K 3.8 3.5 3.5  CL 111 110 110  CO2 20* 23 23  BUN 21 19 11   CREATININE 0.92 0.84 0.68  GLUCOSE 121* 137* 130*   Recent Labs  Lab 04/01/19 0547 04/02/19 0501 04/03/19 0413  HGB 12.6 11.2* 10.7*  HCT 38.9 33.7* 32.6*  WBC 8.6 12.7* 15.6*  PLT 203 194 201  Mr Brain Wo Contrast  Result Date: 04/01/2019 CLINICAL DATA:  Acute presentation unresponsive. EXAM: MRI HEAD WITHOUT CONTRAST TECHNIQUE: Multiplanar, multiecho pulse sequences of the brain and surrounding structures were obtained without intravenous contrast. COMPARISON:  Head CT 2019/04/24  FINDINGS: Brain: Diffusion imaging does not show any acute or subacute infarction. Brainstem and cerebellum are normal. Cerebral hemispheres show moderate changes of chronic small vessel disease affecting the deep and subcortical white matter. No cortical or large vessel territory infarction. No mass lesion, hemorrhage, hydrocephalus or extra-axial collection. Vascular: Major vessels at the base of the brain show flow. Skull and upper cervical spine: Negative Sinuses/Orbits: Clear/normal Other: None IMPRESSION: No abnormality seen to explain the clinical presentation. No acute or subacute infarction or any reversible finding. Moderate chronic small-vessel ischemic changes the cerebral hemispheric white matter. Electronically Signed   By: Nelson Chimes M.D.   On: 04/01/2019 14:55   Dg Chest Port 1 View  Result Date: 04/02/2019 CLINICAL DATA:  Respiratory failure. EXAM: PORTABLE CHEST 1 VIEW COMPARISON:  Radiograph of April 01, 2019. FINDINGS: Endotracheal and nasogastric tubes are unchanged in position. No pneumothorax is noted. No significant pleural effusion is noted. Left lung is clear. Stable right upper and lower lobe airspace opacities are noted concerning for pneumonia. Bony thorax is unremarkable. IMPRESSION: Stable right lung opacities are noted concerning for pneumonia. Stable support apparatus. Electronically Signed   By: Marijo Conception M.D.   On: 04/02/2019 07:46    ASSESSMENT / PLAN:  68 yo F admitted to ICU for acute hypoxic respiratory failure from acute right lobe aspiration pneumonia s/t possible ETOH abuse and possible intentional drug OD from benzodiazepines causing acute metabolic encephalopathy.   Severe ACUTE Hypoxic  Respiratory Failure -continue Mechanical Ventilator support -continue Bronchodilator Therapy -Wean Fio2 and PEEP as tolerated -VAP/VENT bundle implementation  INFECTIOUS DISEASE-ASPIRATION pneumonia RT lung -continue antibiotics as prescribed(unasyn) - follow  up cultures, pending respiratory culture, change antibiotic as indicated - follow up chest xray  NEUROLOGY-no significant improvement in mentation - intubated and off sedation Probable seizures-on Keppra Seizure precautions CT/MRI WNL EEG pending Follow up neurology recs-may need repeat imaging studies  ELECTROLYTES -follow labs as needed -replace as needed -pharmacy consultation and following  DVT PROPHYLAXIS - enoxaparin daily  NUTRITIONAL STATUS DIET-->TF's as tolerated Constipation protocol as indicated   Critical Care Time devoted to patient care services described in this note is 32 minutes.   Overall, patient is critically ill, prognosis is guarded.   Prognosis seems very poor at this time Family at bedside updated and notified  Adaleigh Warf Fabianna Pesa, M.D.  [facility]  Medical Director [place] Director [facility] Cardio-Pulmonary Department

## 2019-04-03 NOTE — Consult Note (Signed)
Pharmacy Antibiotic Note  Sonia Farrell is a 68 y.o. female admitted on 03/24/2019 with aspiration pneumonia. CXR significant for severe multilobar pneumonia in right lung.  Pharmacy has been consulted for Unasyn dosing. Patient is currently intubated and minimally responsive despite being off sedation.  Today is day 3 of therapy with Unasyn for aspiration PNA. Worsening leukocytosis today. Patient afebrile last 24h.   Plan: Continue Unasyn 3g q6h  Continue to monitor labs, vitals, cultures for signs of clinical improvement with antibiotic therapy.   Height: 5\' 7"  (170.2 cm) Weight: 173 lb 4.5 oz (78.6 kg) IBW/kg (Calculated) : 61.6  Temp (24hrs), Avg:99.4 F (37.4 C), Min:98.5 F (36.9 C), Max:100.2 F (37.9 C)  Recent Labs  Lab 04/01/2019 1112 04/10/2019 1138 03/20/2019 1330 04/01/19 0547 04/02/19 0501 04/03/19 0413  WBC 8.2  --   --  8.6 12.7* 15.6*  CREATININE 1.09*  --   --  0.92 0.84 0.68  LATICACIDVEN  --  4.0* 3.7*  --   --   --     Estimated Creatinine Clearance: 72.7 mL/min (by C-G formula based on SCr of 0.68 mg/dL).    Allergies  Allergen Reactions  . Escitalopram Oxalate Other (See Comments)    MUSCLE SPASMS    Antimicrobials this admission: 9/12 Azithromycin and Ceftriaxone x 1 9/12 Unasyn >>  Dose adjustments this admission: None  Microbiology results: 9/14 sputum cx: few GPC in clusters 9/12 BCx: no growth 3 days  9/12 UCx: No growth 9/12 MRSA PCR: negative  9/12 COVID: negative   Thank you for allowing pharmacy to be a part of this patient's care.  [place] Resident 04/03/2019 1:21 PM

## 2019-04-03 NOTE — Procedures (Signed)
Patient Name: Sonia Farrell  MRN: [id number]  Epilepsy Attending: Lora Havens  Referring Physician/Provider: Dr Flora Lipps Date: 04/03/2019  Duration: 25.07 mins  Patient history: 68yo F with ams. EEG to evaluate for seizure  Level of alertness: comatose  AEDs during EEG study: keppra  Technical aspects: This EEG study was done with scalp electrodes positioned according to the 10-20 International system of electrode placement. Electrical activity was acquired at a sampling rate of 500Hz  and reviewed with a high frequency filter of 70Hz  and a low frequency filter of 1Hz . EEG data were recorded continuously and digitally stored.   DESCRIPTION: EEG showed continuous generalized 3-5hz  theta-delta slowing, at times rhythmic, admixed with 15 to 18 Hz, 2-3 uV beta activity with irregular morphology distributed symmetrically and diffusely, maximal bifrontal. EEG was reactive to tactile stimulation.  Hyperventilation and photic stimulation were not performed.  ABNORMALITY: 1. Continuous slow, generalized  2. Excessive fast, generalized  IMPRESSION: This study is suggestive of moderate to severe diffuse encephalopathy. No seizures or definite epileptiform discharges were seen throughout the recording.  The excessive beta activity seen in the background is most likely due to the effect of medications like benzodiazepine and is a benign EEG pattern.

## 2019-04-03 NOTE — Consult Note (Signed)
PHARMACY CONSULT NOTE - FOLLOW UP  Pharmacy Consult for Electrolyte Monitoring and Replacement   Recent Labs: Potassium (mmol/L)  Date Value  04/03/2019 3.5   Magnesium (mg/dL)  Date Value  04/03/2019 2.1   Calcium (mg/dL)  Date Value  04/03/2019 8.3 (L)   Albumin (g/dL)  Date Value  04/01/2019 2.9 (L)   Phosphorus (mg/dL)  Date Value  04/03/2019 3.0   Sodium (mmol/L)  Date Value  04/03/2019 141  Corrected calcium: 9.2 mg/dL   Assessment: 68 y/o F with acute hypoxic respiratory failure due to aspiration pneumonia in the setting of possible intentional overdose with benzos and alcohol. Patient remains intubated and minimally responsive despite being taken off of sedation.    Goal of Therapy:  K ~4 and Mg ~2  Plan:  Potassium chloride packets 40 mEq PO x1.   Re-check BMP tomorrow AM.   [place] Resident 04/03/2019 1:16 PM

## 2019-04-04 ENCOUNTER — Inpatient Hospital Stay: Payer: Medicare Other

## 2019-04-04 DIAGNOSIS — T17910D Gastric contents in respiratory tract, part unspecified causing asphyxiation, subsequent encounter: Secondary | ICD-10-CM

## 2019-04-04 LAB — BASIC METABOLIC PANEL
Anion gap: 7 (ref 5–15)
BUN: 15 mg/dL (ref 8–23)
CO2: 27 mmol/L (ref 22–32)
Calcium: 8.9 mg/dL (ref 8.9–10.3)
Chloride: 110 mmol/L (ref 98–111)
Creatinine, Ser: 0.69 mg/dL (ref 0.44–1.00)
GFR calc Af Amer: >60 mL/min (ref >60)
GFR calc non Af Amer: >60 mL/min (ref >60)
Glucose, Bld: 133 mg/dL — ABNORMAL HIGH (ref 70–99)
Potassium: 3.8 mmol/L (ref 3.5–5.1)
Sodium: 144 mmol/L (ref 135–145)

## 2019-04-04 LAB — GLUCOSE, CAPILLARY
Glucose-Capillary: 142 mg/dL — ABNORMAL HIGH (ref 70–99)
Glucose-Capillary: 143 mg/dL — ABNORMAL HIGH (ref 70–99)
Glucose-Capillary: 150 mg/dL — ABNORMAL HIGH (ref 70–99)

## 2019-04-04 LAB — CBC
HCT: 33.1 % — ABNORMAL LOW (ref 36.0–46.0)
Hemoglobin: 10.7 g/dL — ABNORMAL LOW (ref 12.0–15.0)
MCH: 30.7 pg (ref 26.0–34.0)
MCHC: 32.3 g/dL (ref 30.0–36.0)
MCV: 94.8 fL (ref 80.0–100.0)
Platelets: 215 10*3/uL (ref 150–400)
RBC: 3.49 MIL/uL — ABNORMAL LOW (ref 3.87–5.11)
RDW: 13.2 % (ref 11.5–15.5)
WBC: 11.6 10*3/uL — ABNORMAL HIGH (ref 4.0–10.5)
nRBC: 0 % (ref 0.0–0.2)

## 2019-04-04 LAB — C DIFFICILE QUICK SCREEN W PCR REFLEX
C Diff antigen: NEGATIVE
C Diff interpretation: NOT DETECTED
C Diff toxin: NEGATIVE

## 2019-04-04 LAB — TRIGLYCERIDES: Triglycerides: 148 mg/dL (ref <150)

## 2019-04-04 IMAGING — DX DG CHEST 1V PORT
1 series · 1 of 1 positions shown · non-contrast
Comparison: Radiographs [DATE] and [DATE].

CLINICAL DATA: Multilobar pneumonia.  Intubated patient.

EXAM:
PORTABLE CHEST 1 VIEW

[chest ap]
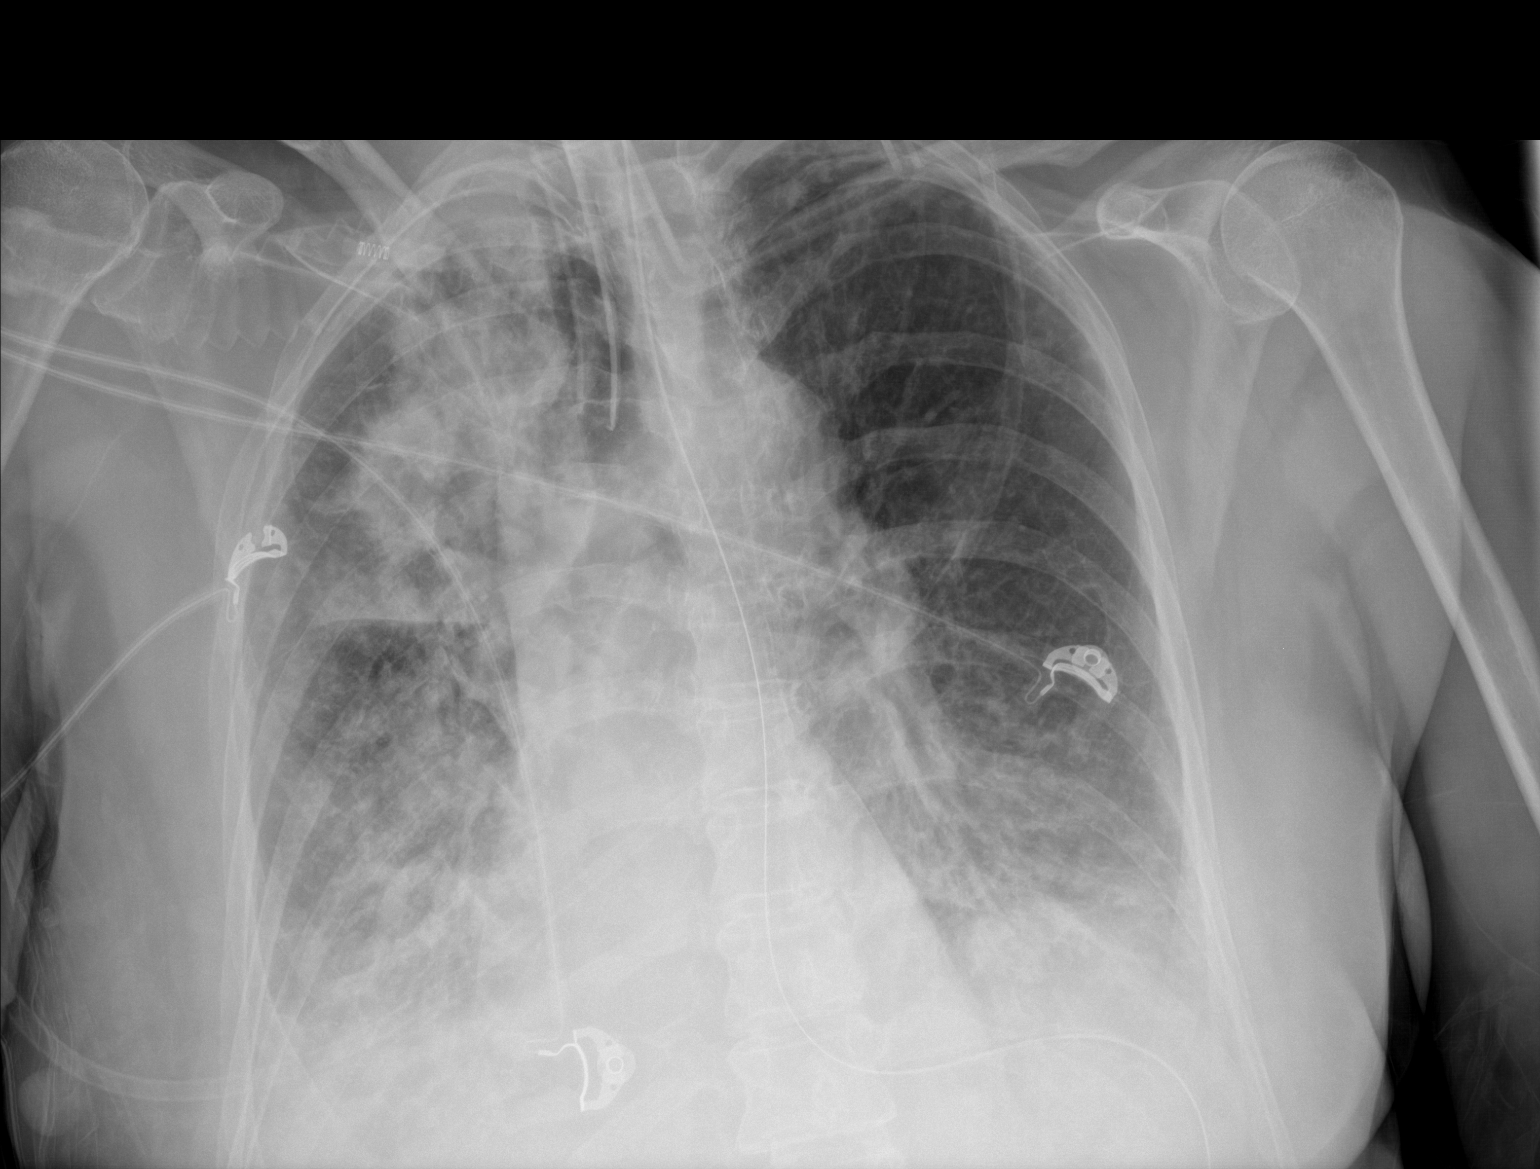

[1 of 1 positions shown; findings below may reference images not displayed]

FINDINGS: [Y1] hours. Mild patient rotation to the right. Tip of the
endotracheal tube is in the mid trachea. Enteric tube projects below
the diaphragm, tip not visualized. The heart size and mediastinal
contours are stable.

Right upper lobe airspace disease appears unchanged. There is slight
worsening of the bibasilar airspace opacities. There are possible
small bilateral pleural effusions. No pneumothorax. The bones appear
unchanged.
IMPRESSION: 1. Stable position of the endotracheal and nasogastric tubes.
2. Worsening basilar components of bilateral airspace opacities
consistent with multilobar pneumonia. Possible small bilateral
pleural effusions.

## 2019-04-04 IMAGING — DX DG ABDOMEN 1V
1 series · 1 of 1 positions shown · non-contrast
Comparison: One view abdomen [DATE].

CLINICAL DATA: Nasogastric tube placement.

EXAM:
ABDOMEN - 1 VIEW

[abdomen supine]
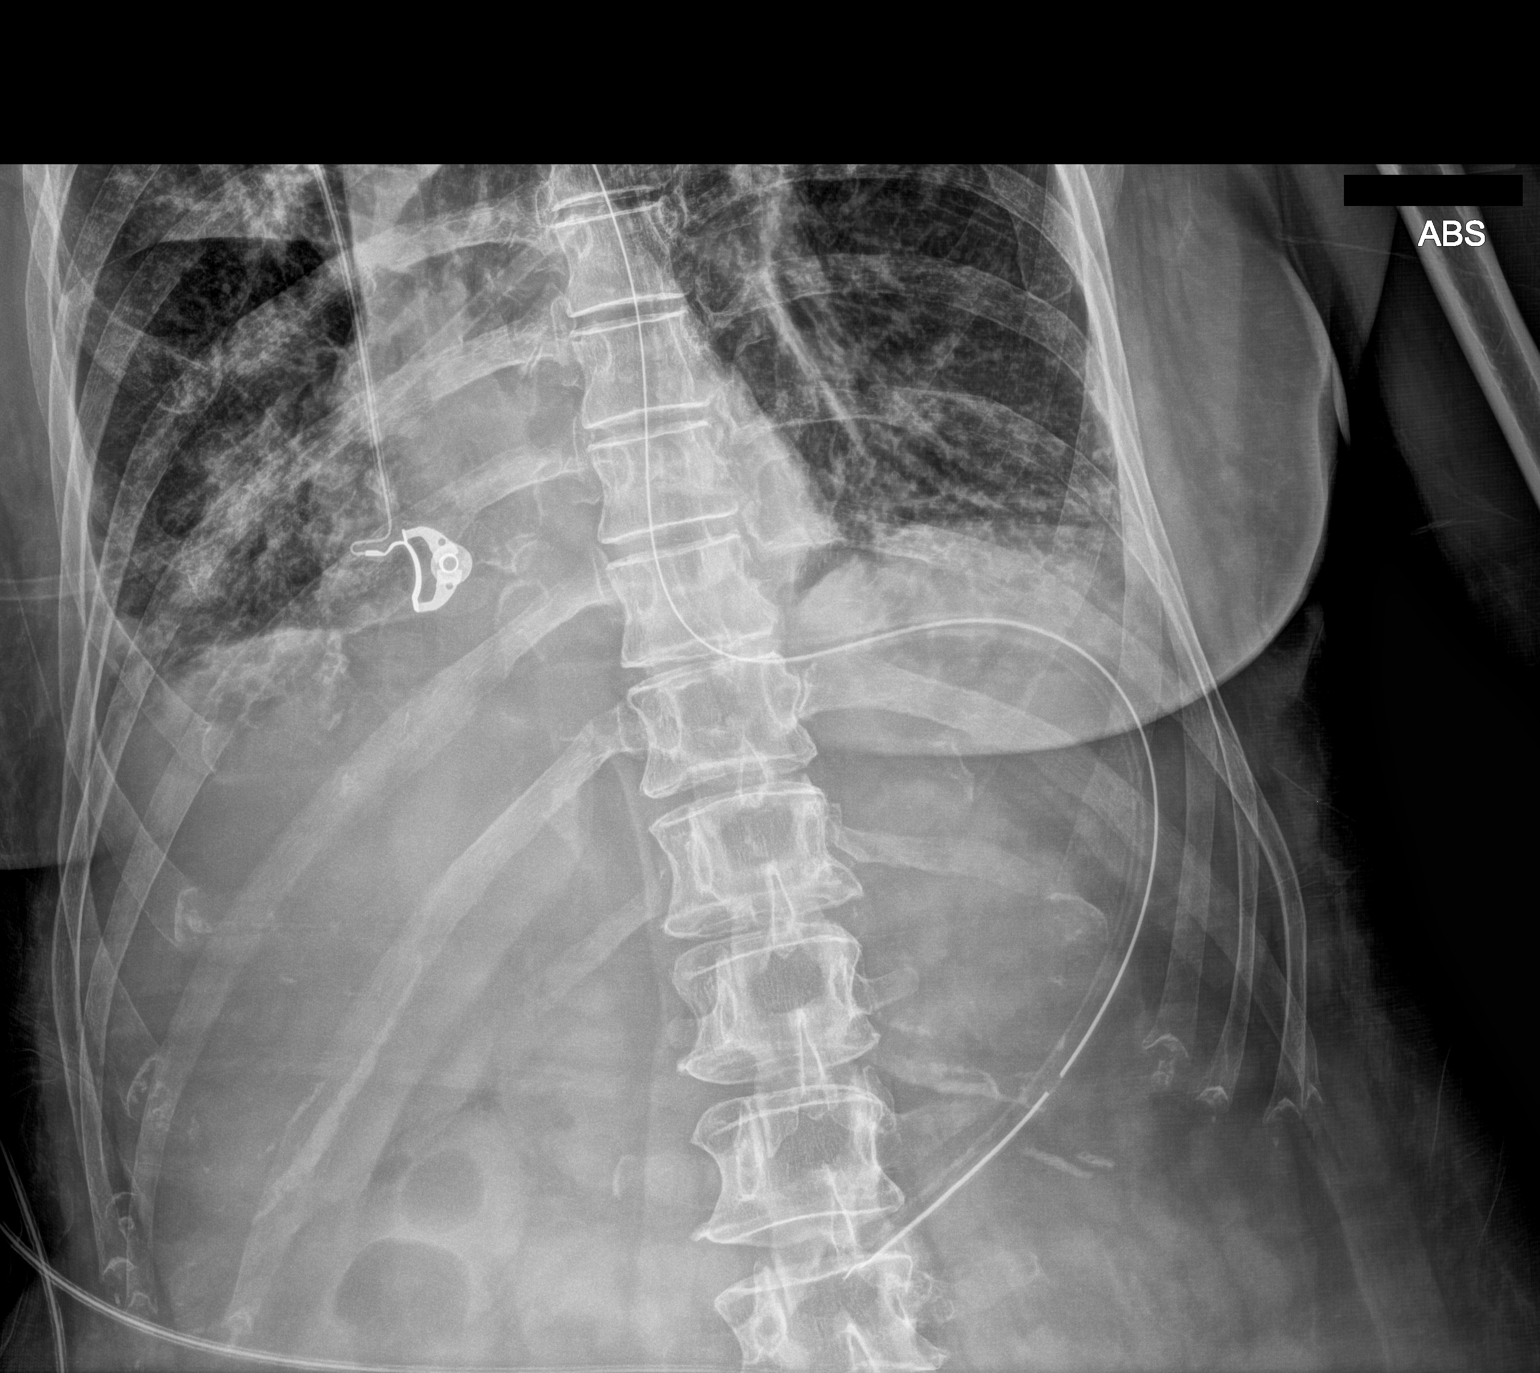

[1 of 1 positions shown; findings below may reference images not displayed]

FINDINGS: [J3] hours. Nasogastric tube projects over the mid lumbar spine,
consistent with location in the mid stomach. No significant residual
gastric distention. There is no dilated small bowel within the
visualized upper abdomen. Patchy airspace opacities are present at
both lung bases. These are better seen on concurrent chest
radiographs.
IMPRESSION: Nasogastric tube projects to the level of the mid stomach.

## 2019-04-04 MED ORDER — POTASSIUM CHLORIDE 20 MEQ PO PACK
40.0000 meq | PACK | Freq: Once | ORAL | Status: AC
  Administered 2019-04-04: 40 meq via ORAL
  Filled 2019-04-04: qty 2

## 2019-04-04 MED ORDER — BUDESONIDE 0.5 MG/2ML IN SUSP
0.5000 mg | Freq: Two times a day (BID) | RESPIRATORY_TRACT | Status: DC
  Administered 2019-04-04 – 2019-04-11 (×14): 0.5 mg via RESPIRATORY_TRACT
  Filled 2019-04-04 (×15): qty 2

## 2019-04-04 MED ORDER — FENTANYL CITRATE (PF) 100 MCG/2ML IJ SOLN
100.0000 ug | Freq: Once | INTRAMUSCULAR | Status: AC
  Administered 2019-04-04: 100 ug via INTRAVENOUS

## 2019-04-04 MED ORDER — MIDAZOLAM HCL 2 MG/2ML IJ SOLN
INTRAMUSCULAR | Status: AC
  Administered 2019-04-04: 12:00:00 4 mg via INTRAVENOUS
  Filled 2019-04-04: qty 4

## 2019-04-04 MED ORDER — IPRATROPIUM-ALBUTEROL 0.5-2.5 (3) MG/3ML IN SOLN
3.0000 mL | RESPIRATORY_TRACT | Status: DC
  Administered 2019-04-04 – 2019-04-11 (×43): 3 mL via RESPIRATORY_TRACT
  Filled 2019-04-04 (×44): qty 3

## 2019-04-04 MED ORDER — STERILE WATER FOR INJECTION IJ SOLN
INTRAMUSCULAR | Status: AC
  Administered 2019-04-04: 12:00:00 10 mL
  Filled 2019-04-04: qty 10

## 2019-04-04 MED ORDER — VECURONIUM BROMIDE 10 MG IV SOLR
INTRAVENOUS | Status: AC
  Administered 2019-04-04: 10 mg via INTRAVENOUS
  Filled 2019-04-04: qty 10

## 2019-04-04 MED ORDER — MIDAZOLAM HCL 2 MG/2ML IJ SOLN
4.0000 mg | Freq: Once | INTRAMUSCULAR | Status: AC
  Administered 2019-04-04: 12:00:00 4 mg via INTRAVENOUS

## 2019-04-04 MED ORDER — VECURONIUM BROMIDE 10 MG IV SOLR
10.0000 mg | Freq: Once | INTRAVENOUS | Status: AC
  Administered 2019-04-04: 12:00:00 10 mg via INTRAVENOUS

## 2019-04-04 NOTE — Consult Note (Signed)
PHARMACY CONSULT NOTE - FOLLOW UP  Pharmacy Consult for Electrolyte Monitoring and Replacement   Recent Labs: Potassium (mmol/L)  Date Value  04/04/2019 3.8   Magnesium (mg/dL)  Date Value  04/03/2019 2.1   Calcium (mg/dL)  Date Value  04/04/2019 8.9   Albumin (g/dL)  Date Value  04/01/2019 2.9 (L)   Phosphorus (mg/dL)  Date Value  04/03/2019 3.0   Sodium (mmol/L)  Date Value  04/04/2019 144  Corrected calcium: 9.2 mg/dL   Assessment: 68 y/o F with acute hypoxic respiratory failure due to aspiration pneumonia in the setting of possible intentional overdose with benzos and alcohol. Patient was extubated 9/16 and vomited. Increased work of breathing on non-rebreather mask and was subsequently re-intubated. Patient with some diarrhea today. C diff panel sent.    Goal of Therapy:  K ~4 and Mg ~2  Plan:  Potassium chloride packets 40 mEq PO x1.   Re-check BMP, Mg, and Phos tomorrow [place] Resident 04/04/2019 3:26 PM

## 2019-04-04 NOTE — Progress Notes (Signed)
Pt. Extubated and on nrm ,sat 95,rr 25,hr 102.

## 2019-04-04 NOTE — Progress Notes (Signed)
[facility] [place] [facility]   PATIENT NAME: Sonia Farrell    MR#:  [id number]  DATE OF BIRTH:  [birth year]  SUBJECTIVE:   Patient was extubated this afternoon, but while ET tube was being pulled out, she vomited. She was placed on a non-rebreather mask and had increased work of breathing. She was then emergently reintubated. Bronchoscopy was performed by Dr. Belia HemanKasa.  REVIEW OF SYSTEMS:  ROS- unable to obtain due to intubation.  DRUG ALLERGIES:   Allergies  Allergen Reactions   Escitalopram Oxalate Other (See Comments)    MUSCLE SPASMS   VITALS:  Blood pressure (!) 151/67, pulse 93, temperature 98.3 F (36.8 C), temperature source Oral, resp. rate (!) 29, height 5\' 7"  (1.702 m), weight 78.6 kg, SpO2 91 %. PHYSICAL EXAMINATION:  Physical Exam  GENERAL:  Laying in the bed with no acute distress.  HEENT: Head atraumatic, normocephalic. Pupils equal, round, reactive to light and accommodation. No scleral icterus. Extraocular muscles intact. Oropharynx and nasopharynx clear. ETT in place NECK:  Supple, no jugular venous distention. No thyroid enlargement. LUNGS: +diminished breath sounds throughout all lung fields. No wheezes, crackles, rhonchi. No use of accessory muscles of respiration.  CARDIOVASCULAR: RRR, S1, S2 normal. No murmurs, rubs, or gallops.  ABDOMEN: Soft, nontender, nondistended. Bowel sounds present.  EXTREMITIES: No pedal edema, cyanosis, or clubbing.  NEUROLOGIC: intubated. Does not open eyes. +grimacing PSYCHIATRIC: intubated. SKIN: No obvious rash, lesion, or ulcer.  LABORATORY PANEL:  Female CBC Recent Labs  Lab 04/04/19 0534  WBC 11.6*  HGB 10.7*  HCT 33.1*  PLT 215   ------------------------------------------------------------------------------------------------------------------ Chemistries  Recent Labs  Lab 04/01/19 0547  04/03/19 0413 04/04/19 0534  NA 142   < > 141 144  K 3.8   < > 3.5 3.8  CL 111   < > 110 110    CO2 20*   < > 23 27  GLUCOSE 121*   < > 130* 133*  BUN 21   < > 11 15  CREATININE 0.92   < > 0.68 0.69  CALCIUM 7.7*   < > 8.3* 8.9  MG 1.7   < > 2.1  --   AST 72*  --   --   --   ALT 36  --   --   --   ALKPHOS 52  --   --   --   BILITOT 0.7  --   --   --    < > = values in this interval not displayed.   RADIOLOGY:  Dg Abd 1 View  Result Date: 04/04/2019 CLINICAL DATA:  Nasogastric tube placement. EXAM: ABDOMEN - 1 VIEW COMPARISON:  One view abdomen 11-19-2018. FINDINGS: 1234 hours. Nasogastric tube projects over the mid lumbar spine, consistent with location in the mid stomach. No significant residual gastric distention. There is no dilated small bowel within the visualized upper abdomen. Patchy airspace opacities are present at both lung bases. These are better seen on concurrent chest radiographs. IMPRESSION: Nasogastric tube projects to the level of the mid stomach. Electronically Signed   By: Carey BullocksWilliam  Veazey M.D.   On: 04/04/2019 12:57   Mr Laqueta JeanBrain W WUWo Contrast  Result Date: 04/03/2019 CLINICAL DATA:  In cephalopathy. EXAM: MRI HEAD WITHOUT AND WITH CONTRAST TECHNIQUE: Multiplanar, multiecho pulse sequences of the brain and surrounding structures were obtained without and with intravenous contrast. CONTRAST:  7mL GADAVIST GADOBUTROL 1 MMOL/ML IV SOLN COMPARISON:  04/01/2019 FINDINGS: Brain: Diffusion imaging does not show  any acute or subacute infarction. The brainstem and cerebellum are normal. Cerebral hemispheres show moderate chronic small-vessel ischemic changes of the white matter. No cortical or large vessel territory infarction. No mass lesion, hemorrhage, hydrocephalus or extra-axial collection. Vascular: Major vessels at the base of the brain show flow. Skull and upper cervical spine: Negative Sinuses/Orbits: Clear/normal. Other: Nasopharyngeal fluid related to intubation. IMPRESSION: No change. No acute intracranial finding. Moderate chronic small-vessel ischemic changes of the  white matter. Electronically Signed   By: Paulina Fusi M.D.   On: 04/03/2019 18:58   Mr Cervical Spine W Wo Contrast  Result Date: 04/03/2019 CLINICAL DATA:  Myelopathy.  Weakness of the extremities. EXAM: MRI CERVICAL SPINE WITHOUT AND WITH CONTRAST TECHNIQUE: Multiplanar and multiecho pulse sequences of the cervical spine, to include the craniocervical junction and cervicothoracic junction, were obtained without and with intravenous contrast. CONTRAST:  95mL GADAVIST GADOBUTROL 1 MMOL/ML IV SOLN COMPARISON:  None. FINDINGS: Alignment: Normal Vertebrae: No fracture or primary bone lesion. Cord: No primary cord lesion.  See below regarding stenosis. Posterior Fossa, vertebral arteries, paraspinal tissues: Negative Disc levels: No abnormality at foramen magnum, C1-2 or C2-3. C3-4: Uncovertebral hypertrophy and facet arthropathy on the left. Left foraminal stenosis could affect the C4 nerve. No central canal stenosis. C4-5: Endplate osteophytes and mild bulging of the disc. Facet arthropathy on the left. Mild left foraminal narrowing. C5-6: Broad-based, right posterolateral predominant disc herniation effaces the ventral subarachnoid space and indents the right side of the cord. Proximal foraminal encroachment on the right. Facet arthropathy on the right. Right foraminal narrowing could affect the C6 nerve. C6-7: Spondylosis with endplate osteophytes and shallow protrusion of the disc. Narrowing the ventral subarachnoid space but no compression of the cord. Bilateral foraminal narrowing. C7-T1: No disc pathology.  Mild facet hypertrophy. T1-2: Normal. No significant enhancing pathology. IMPRESSION: No primary cord pathology. C3-4: Left foraminal stenosis due to encroachment by uncovertebral osteophytes and facet arthropathy could affect the left C4 nerve. C4-5: Mild left foraminal narrowing due to uncovertebral prominence and facet degeneration, without definite compressive stenosis. C5-6: Broad-based, right  posterolateral predominant disc herniation with effacement of the ventral subarachnoid space and slight deformity of the right side of the cord. Facet arthropathy on the right. Right foraminal stenosis could affect the right C6 nerve. C6-7: Chronic degenerative spondylosis. Narrowing of the ventral subarachnoid space but no compression of the cord. Bilateral foraminal narrowing could affect either C7 nerve. Electronically Signed   By: Paulina Fusi M.D.   On: 04/03/2019 19:02   Dg Chest Port 1 View  Result Date: 04/04/2019 CLINICAL DATA:  Multilobar pneumonia.  Intubated patient. EXAM: PORTABLE CHEST 1 VIEW COMPARISON:  Radiographs 04/03/2019 and 04/02/2019. FINDINGS: 1233 hours. Mild patient rotation to the right. Tip of the endotracheal tube is in the mid trachea. Enteric tube projects below the diaphragm, tip not visualized. The heart size and mediastinal contours are stable. Right upper lobe airspace disease appears unchanged. There is slight worsening of the bibasilar airspace opacities. There are possible small bilateral pleural effusions. No pneumothorax. The bones appear unchanged. IMPRESSION: 1. Stable position of the endotracheal and nasogastric tubes. 2. Worsening basilar components of bilateral airspace opacities consistent with multilobar pneumonia. Possible small bilateral pleural effusions. Electronically Signed   By: Carey Bullocks M.D.   On: 04/04/2019 12:59   Dg Chest Port 1 View  Result Date: 04/03/2019 CLINICAL DATA:  Evaluate for pneumonia. EXAM: PORTABLE CHEST 1 VIEW COMPARISON:  Chest radiograph 04/02/2019, 04/01/2019 FINDINGS: Stable cardiomediastinal contours. The  endotracheal tip terminates between the thoracic inlet and carina. Nasogastric tube courses below the diaphragm with distal aspect out of field of view. No pneumothorax or pleural effusion. There are persistent diffuse right-sided heterogeneous pulmonary opacities. The left lung is clear. Visualized bony skeleton is  unremarkable. IMPRESSION: Persistent diffuse right-sided heterogeneous pulmonary opacities suspicious for infection. Electronically Signed   By: Audie Pinto M.D.   On: 04/03/2019 15:36   ASSESSMENT AND PLAN:   Acute hypoxic respiratory failure due to aspiration pneumonia- in the setting of possible intentional overdose with benzos. Attempted extubation today, but patient had vomiting and increased work of breathing and was subsequently re-intubated. -Bronchoscopy performed by Dr. Mortimer Fries today -Vent management per CCM -Continue unasyn  Sepsis due to aspiration pneumonia- meeting sepsis criteria on admission with fever, tachycardia, and tachypnea. Procalcitonin elevated. Sepsis has resolved. -Continue IVFs -Blood cultures with no growth to date  Altered mental status due to unclear etiology- possible benzo overdose vs seizures -Continue keppra -Neurology following -MRI brain unremarkable -Repeat MRI brain and c-spine were unremarkable except for foraminal stenosis and some disc herniatiosn -EEG with moderate to severe diffuse encephalopathy  All the records are reviewed and case discussed with Care Management/Social Worker. Management plans discussed with the patient, family and they are in agreement.  CODE STATUS: Full Code  TOTAL TIME TAKING CARE OF THIS PATIENT: 36 minutes.   More than 50% of the time was spent in counseling/coordination of care: YES  POSSIBLE D/C unknown, DEPENDING ON CLINICAL CONDITION.   Berna Spare Jacoby Ritsema M.D on 04/04/2019 at 1:52 PM  Between 7am to 6pm - Pager - [phone]  After 6pm go to [email or web address] - Proofreader  [facility] [place] [facility]  Office  [phone]  CC: Primary care physician; [place] Pink, MD  Note: This dictation was prepared with Dragon dictation along with smaller phrase technology. Any transcriptional errors that result from this process are unintentional.

## 2019-04-04 NOTE — Consult Note (Signed)
Pharmacy Antibiotic Note  Sonia Farrell is a 68 y.o. female admitted on 04/12/2019 with aspiration pneumonia. CXR significant for severe multilobar pneumonia in right lung.  Pharmacy has been consulted for Unasyn dosing. Patient was extubated 9/16 and vomited. Increased work of breathing on non-rebreather mask and was subsequently re-intubated.   Today is day 4 of therapy with Unasyn for aspiration PNA. Planned duration of therapy of 7 days per MD. Improving leukocytosis today and afebrile last 24h.   Plan: Continue Unasyn 3g q6h  Continue to monitor labs, vitals, cultures for signs of clinical improvement with antibiotic therapy.   Height: 5\' 7"  (170.2 cm) Weight: 173 lb 4.5 oz (78.6 kg) IBW/kg (Calculated) : 61.6  Temp (24hrs), Avg:98.3 F (36.8 C), Min:97.5 F (36.4 C), Max:99.5 F (37.5 C)  Recent Labs  Lab 04/01/2019 1112 04/07/2019 1138 04/10/2019 1330 04/01/19 0547 04/02/19 0501 04/03/19 0413 04/04/19 0534  WBC 8.2  --   --  8.6 12.7* 15.6* 11.6*  CREATININE 1.09*  --   --  0.92 0.84 0.68 0.69  LATICACIDVEN  --  4.0* 3.7*  --   --   --   --     Estimated Creatinine Clearance: 72.7 mL/min (by C-G formula based on SCr of 0.69 mg/dL).    Allergies  Allergen Reactions  . Escitalopram Oxalate Other (See Comments)    MUSCLE SPASMS    Antimicrobials this admission: 9/12 Azithromycin and Ceftriaxone x 1 9/12 Unasyn >>  Dose adjustments this admission: None  Microbiology results: 9/16 C diff: pending  9/14 sputum cx: few S aureus 9/12 BCx: no growth 4 days  9/12 UCx: No growth 9/12 MRSA PCR: negative  9/12 COVID: negative   Thank you for allowing pharmacy to be a part of this patient's care.  [place] Resident 04/04/2019 3:20 PM

## 2019-04-04 NOTE — Progress Notes (Signed)
Patient tolerated vent- 28% 5/15- patient would follow commands- squeezed hands on commands and moved feet on command- she could raise her head off the bed. Patient extubated per Dr. Mortimer Fries- while pulling the ET out- patient started projectile vomiting - approximately 278ml suctioned from patients mouth.  Patient was placed on non re breather mask and she was using accessory muscles for work of breathin- notified Dr. Mortimer Fries and patient was reintubated and at this time Dr. Mortimer Fries is performing a bronchoscopy.

## 2019-04-04 NOTE — Progress Notes (Addendum)
Name: Sonia Farrell MRN: [id number] DOB: 1950-11-05     CONSULTATION DATE: 04/06/2019  CHIEF COMPLAINT:  Acute hypoxic respiratory failure  SIGNIFICANT EVENTS/STUDIES:  9/12 admitted to ICU via ED, intubated and unresponsive 9/12CXR suspicious for right lobe CAP, Lactic acid elevated 9/12Possible seizure noted, CT head showed no intracranial abnormalities 9/13MRI brain showed no abnormalities 9/15 Remains minimally responsive and intubated without sedation 9/15 EEG showed severe encephalopathy with no seizure activity 9/16 Significantly more responsive, following commands 9/16 Extubated in ICU. Immediate copious emesis and aspiration occurred 9/16 Re-intubated and bronchoscopy performed to clear aspirate   HISTORY OF PRESENT ILLNESS:  History obtained from family member. Sonia Farrell was found 9/12 unresponsive with a bottle of wine and drug bottles around her. Her husband died 3 weeks prior which exacerbated her depression. She was brought to the ED, intubated and transferred to the ICU. CT and MRI showed no abnormalities. Pt was taken off all sedation, but was minimally responsive, opening eyes to voice but staring blankly. She became significantly more responsive today (9/16) and began following commands. Pt was extubated but aspirated on emesis immediately and had to be re-intubated with bronchoscopy performed to clear aspirate.  PAST MEDICAL HISTORY :   has a past medical history of Arthritis, GERD (gastroesophageal reflux disease), Hyperlipidemia, and Hypothyroidism.  has a past surgical history that includes Colonoscopy; Cataract extraction w/PHACO (Left, 12/26/2018); and Cataract extraction w/PHACO (Right, 01/23/2019). Prior to Admission medications   Medication Sig Start Date End Date Taking? Authorizing Provider  ALPRAZolam Duanne Moron) 0.5 MG tablet Take 0.5 mg by mouth as needed for anxiety.   Yes [provider]  levothyroxine (SYNTHROID) 75 MCG tablet Take 75  mcg by mouth daily before breakfast.   Yes [provider]  pantoprazole (PROTONIX) 40 MG tablet Take 40 mg by mouth daily as needed.   Yes [provider]  PARoxetine (PAXIL) 10 MG tablet Take 10 mg by mouth daily.   Yes [provider]  rosuvastatin (CRESTOR) 10 MG tablet Take 10 mg by mouth daily.   Yes [provider]  acetaminophen (TYLENOL) 325 MG tablet Take 650 mg by mouth every 6 (six) hours as needed.    [provider]   Allergies  Allergen Reactions  . Escitalopram Oxalate Other (See Comments)    MUSCLE SPASMS   REVIEW OF SYSTEMS:   Unable to obtain due to critical illness  VITAL SIGNS: Temp:  [97.5 F (36.4 C)-99.5 F (37.5 C)] 98.4 F (36.9 C) (09/16 0854) Pulse Rate:  [69-106] 84 (09/16 1000) Resp:  [13-23] 19 (09/16 1000) BP: (100-153)/(54-94) 131/70 (09/16 1000) SpO2:  [92 %-97 %] 92 % (09/16 1000) FiO2 (%):  [28 %-40 %] 28 % (09/16 0854)   I/O last 3 completed shifts: In: 3534.4 [I.V.:51.5; NG/GT:2282.8; IV Piggyback:1200.1] Out: 7628 [Urine:2430] Total I/O In: 292.1 [I.V.:7.1; NG/GT:85; IV Piggyback:200] Out: -    SpO2: 92 % O2 Flow Rate (L/min): 15 L/min FiO2 (%): 28 %   Physical Examination:  GENERAL:critically ill appearing, +resp distress HEAD: Normocephalic, atraumatic.  EYES: Pupils equal, round, reactive to light.  No scleral icterus.  MOUTH: Moist mucosal membrane. NECK: Supple. No JVD.  PULMONARY: +wheezing bilaterally CARDIOVASCULAR: S1 and S2. Regular rate and rhythm. No murmurs, rubs, or gallops.  GASTROINTESTINAL: Soft, nontender, -distended. No masses. Positive bowel sounds. No hepatosplenomegaly.  MUSCULOSKELETAL: No swelling, clubbing, or edema.  NEUROLOGIC: obtunded SKIN:intact,warm,dry  I personally reviewed lab work that was obtained in last 24 hrs. CXR Independently reviewed  MEDICATIONS: I have reviewed all medications and confirmed regimen as documented   CULTURE RESULTS    Recent Results (from the past 240 hour(s))  Urine culture     Status: None   Collection Time: 03/20/2019 11:12 AM   Specimen: Urine, Random  Result Value Ref Range Status   Specimen Description   Final    URINE, RANDOM Performed at [facility], [address], [place], [place] [address]    Special Requests   Final    NONE Performed at [facility], [address], [place], [place] [address]    Culture   Final    NO GROWTH Performed at [facility], [place] [address] [place], [place] [address]    Report Status 04/02/2019 FINAL  Final  Blood Cultures (routine x 2)     Status: None (Preliminary result)   Collection Time: 04/02/2019 11:38 AM   Specimen: BLOOD  Result Value Ref Range Status   Specimen Description BLOOD BLOOD RIGHT HAND  Final   Special Requests   Final    BOTTLES DRAWN AEROBIC AND ANAEROBIC Blood Culture results may not be optimal due to an excessive volume of blood received in culture bottles   Culture   Final    NO GROWTH 4 DAYS Performed at [facility], [address], [place], [place] [address]    Report Status PENDING  Incomplete  SARS Coronavirus 2 [facility] order, Performed in [place] hospital lab) Nasopharyngeal     Status: None   Collection Time: 04/12/2019 11:38 AM   Specimen: Nasopharyngeal  Result Value Ref Range Status   SARS Coronavirus 2 NEGATIVE NEGATIVE Final    Comment: (NOTE) If result is NEGATIVE SARS-CoV-2 target nucleic acids are NOT DETECTED. The SARS-CoV-2 RNA is generally detectable in upper and lower  respiratory specimens during the acute phase of infection. The lowest  concentration of SARS-CoV-2 viral copies this assay can detect is 250  copies / mL. A negative result does not preclude SARS-CoV-2 infection  and should not be used as the sole basis for treatment or other  patient management decisions.  A negative result may occur with  improper specimen collection / handling, submission of specimen  other  than nasopharyngeal swab, presence of viral mutation(s) within the  areas targeted by this assay, and inadequate number of viral copies  (<250 copies / mL). A negative result must be combined with clinical  observations, patient history, and epidemiological information. If result is POSITIVE SARS-CoV-2 target nucleic acids are DETECTED. The SARS-CoV-2 RNA is generally detectable in upper and lower  respiratory specimens dur ing the acute phase of infection.  Positive  results are indicative of active infection with SARS-CoV-2.  Clinical  correlation with patient history and other diagnostic information is  necessary to determine patient infection status.  Positive results do  not rule out bacterial infection or co-infection with other viruses. If result is PRESUMPTIVE POSTIVE SARS-CoV-2 nucleic acids MAY BE PRESENT.   A presumptive positive result was obtained on the submitted specimen  and confirmed on repeat testing.  While 2019 novel coronavirus  (SARS-CoV-2) nucleic acids may be present in the submitted sample  additional confirmatory testing may be necessary for epidemiological  and / or clinical management purposes  to differentiate between  SARS-CoV-2 and other Sarbecovirus currently known to infect humans.  If clinically indicated additional testing with an alternate test  methodology [phone]) is advised. The SARS-CoV-2 RNA is generally  detectable in upper and lower  respiratory sp ecimens during the acute  phase of infection. The expected result is Negative. Fact Sheet for Patients:  StrictlyIdeas.no Fact Sheet for Healthcare Providers: BankingDealers.co.za This test is not yet approved or cleared by the [place] FDA and has been authorized for detection and/or diagnosis of SARS-CoV-2 by FDA under an Emergency Use Authorization (EUA).  This EUA will remain in effect (meaning this test can be used) for the duration  of the COVID-19 declaration under Section 564(b)(1) of the Act, 21 U.S.C. section 360bbb-3(b)(1), unless the authorization is terminated or revoked sooner. Performed at [facility], [address] [place], [place] [address]   Blood Cultures (routine x 2)     Status: None (Preliminary result)   Collection Time: 04/01/2019 11:39 AM   Specimen: BLOOD  Result Value Ref Range Status   Specimen Description BLOOD BLOOD RIGHT FOREARM  Final   Special Requests   Final    BOTTLES DRAWN AEROBIC AND ANAEROBIC Blood Culture adequate volume   Culture   Final    NO GROWTH 4 DAYS Performed at [facility], [address] [place], [place] [address]    Report Status PENDING  Incomplete  MRSA PCR Screening     Status: None   Collection Time: 04/04/2019  5:46 PM   Specimen: Nasopharyngeal  Result Value Ref Range Status   MRSA by PCR NEGATIVE NEGATIVE Final    Comment:        The GeneXpert MRSA Assay (FDA approved for NASAL specimens only), is one component of a comprehensive MRSA colonization surveillance program. It is not intended to diagnose MRSA infection nor to guide or monitor treatment for MRSA infections. Performed at [facility], [address] [place], [place] [address]   Culture, respiratory     Status: None (Preliminary result)   Collection Time: 04/02/19  4:50 PM   Specimen: SPU  Result Value Ref Range Status   Specimen Description   Final    SPUTUM Performed at [facility], [address] [place], [place] [address]    Special Requests   Final    NONE Performed at [facility], [place], [place] [address]    Gram Stain   Final    ABUNDANT WBC PRESENT, PREDOMINANTLY PMN NO SQUAMOUS EPITHELIAL CELLS SEEN FEW GRAM POSITIVE COCCI IN CLUSTERS    Culture   Final    FEW STAPHYLOCOCCUS AUREUS SUSCEPTIBILITIES TO FOLLOW Performed at [facility], [place] [address] [place], [place] [address]    Report  Status PENDING  Incomplete          IMAGING    Mr Jeri Cos KG Contrast  Result Date: 04/03/2019 CLINICAL DATA:  In cephalopathy. EXAM: MRI HEAD WITHOUT AND WITH CONTRAST TECHNIQUE: Multiplanar, multiecho pulse sequences of the brain and surrounding structures were obtained without and with intravenous contrast. CONTRAST:  28m GADAVIST GADOBUTROL 1 MMOL/ML IV SOLN COMPARISON:  04/01/2019 FINDINGS: Brain: Diffusion imaging does not show any acute or subacute infarction. The brainstem and cerebellum are normal. Cerebral hemispheres show moderate chronic small-vessel ischemic changes of the white matter. No cortical or large vessel territory infarction. No mass lesion, hemorrhage, hydrocephalus or extra-axial collection. Vascular: Major vessels at the base of the brain show flow. Skull and upper cervical spine: Negative Sinuses/Orbits: Clear/normal. Other: Nasopharyngeal fluid related to intubation. IMPRESSION: No change. No acute intracranial finding. Moderate chronic small-vessel ischemic changes of the white matter. Electronically Signed   By: MJan FiremanD.  On: 04/03/2019 18:58   Mr Cervical Spine W Wo Contrast  Result Date: 04/03/2019 CLINICAL DATA:  Myelopathy.  Weakness of the extremities. EXAM: MRI CERVICAL SPINE WITHOUT AND WITH CONTRAST TECHNIQUE: Multiplanar and multiecho pulse sequences of the cervical spine, to include the craniocervical junction and cervicothoracic junction, were obtained without and with intravenous contrast. CONTRAST:  90m GADAVIST GADOBUTROL 1 MMOL/ML IV SOLN COMPARISON:  None. FINDINGS: Alignment: Normal Vertebrae: No fracture or primary bone lesion. Cord: No primary cord lesion.  See below regarding stenosis. Posterior Fossa, vertebral arteries, paraspinal tissues: Negative Disc levels: No abnormality at foramen magnum, C1-2 or C2-3. C3-4: Uncovertebral hypertrophy and facet arthropathy on the left. Left foraminal stenosis could affect the C4 nerve. No central canal  stenosis. C4-5: Endplate osteophytes and mild bulging of the disc. Facet arthropathy on the left. Mild left foraminal narrowing. C5-6: Broad-based, right posterolateral predominant disc herniation effaces the ventral subarachnoid space and indents the right side of the cord. Proximal foraminal encroachment on the right. Facet arthropathy on the right. Right foraminal narrowing could affect the C6 nerve. C6-7: Spondylosis with endplate osteophytes and shallow protrusion of the disc. Narrowing the ventral subarachnoid space but no compression of the cord. Bilateral foraminal narrowing. C7-T1: No disc pathology.  Mild facet hypertrophy. T1-2: Normal. No significant enhancing pathology. IMPRESSION: No primary cord pathology. C3-4: Left foraminal stenosis due to encroachment by uncovertebral osteophytes and facet arthropathy could affect the left C4 nerve. C4-5: Mild left foraminal narrowing due to uncovertebral prominence and facet degeneration, without definite compressive stenosis. C5-6: Broad-based, right posterolateral predominant disc herniation with effacement of the ventral subarachnoid space and slight deformity of the right side of the cord. Facet arthropathy on the right. Right foraminal stenosis could affect the right C6 nerve. C6-7: Chronic degenerative spondylosis. Narrowing of the ventral subarachnoid space but no compression of the cord. Bilateral foraminal narrowing could affect either C7 nerve. Electronically Signed   By: MNelson ChimesM.D.   On: 04/03/2019 19:02   Dg Chest Port 1 View  Result Date: 04/03/2019 CLINICAL DATA:  Evaluate for pneumonia. EXAM: PORTABLE CHEST 1 VIEW COMPARISON:  Chest radiograph 04/02/2019, 04/01/2019 FINDINGS: Stable cardiomediastinal contours. The endotracheal tip terminates between the thoracic inlet and carina. Nasogastric tube courses below the diaphragm with distal aspect out of field of view. No pneumothorax or pleural effusion. There are persistent diffuse  right-sided heterogeneous pulmonary opacities. The left lung is clear. Visualized bony skeleton is unremarkable. IMPRESSION: Persistent diffuse right-sided heterogeneous pulmonary opacities suspicious for infection. Electronically Signed   By: NAudie PintoM.D.   On: 04/03/2019 15:36        Indwelling Urinary Catheter continued, requirement due to   Reason to continue Indwelling Urinary Catheter strict Intake/Output monitoring for hemodynamic instability         Ventilator continued, requirement due to severe respiratory failure   Ventilator Sedation RASS 0 to -2      ASSESSMENT AND PLAN SYNOPSIS 68yo F admitted to ICU for acute hypoxic respiratory failure from acute right lobe aspiration pneumonia s/t possible ETOH abuse and possible intentional drug OD from benzodiazepines causing acute metabolic encephalopathy. Failed extubation trial and re-intubated with sedation.  Severe ACUTE Hypoxic and Hypercapnic Respiratory Failure -continue Full MV support -continue Bronchodilator Therapy -Wean Fio2 and PEEP as tolerated -will perform SAT/SBT when respiratory parameters are met  FAILED EXTUBATION/RE-INTUBATION - CXR to confirm placement and assess for new possible aspiration pneumonia  INFECTIOUS DISEASE-ASPIRATION pneumonia RT lung -continue antibiotics as prescribed(unasyn) - follow  up cultures, pending respiratory culture, change antibiotic as indicated - follow up chest xray    NEUROLOGY - intubated and sedated - minimal sedation to achieve a RASS goal: -1 - Possible seizures - on Keppra - Seizure precautions - Follow up neurology recs Wake up assessment - pt following commands, but failed extubation  CARDIAC ICU monitoring  GI GI PROPHYLAXIS as indicated  NUTRITIONAL STATUS DIET-->TF's as tolerated Constipation protocol as indicated  ENDO - will use ICU hypoglycemic\Hyperglycemia protocol if needed  ELECTROLYTES -follow labs as needed -replace as  needed -pharmacy consultation and following   DVT/GI PRX ordered TRANSFUSIONS AS NEEDED MONITOR FSBS ASSESS the need for LABS    Critical Care Time devoted to patient care services described in this note is 50 minutes.   Overall, patient is critically ill, prognosis is guarded.  Patient with Multiorgan failure and at high risk for cardiac arrest and death.    Corrin Parker, M.D.  [facility]  Medical Director [place] Director [facility] Cardio-Pulmonary Department

## 2019-04-04 NOTE — Procedures (Signed)
  PROCEDURE: BRONCHOSCOPY Therapeutic Aspiration of Tracheobronchial Tree  PROCEDURE DATE: 04/04/2019  TIME:  NAME:  Via B [place]  DOB:March 02, 1951  MRN: [id number] LOC:  IC13A/IC13A-AA       Code Status Orders  (From admission, onward)         Start     Ordered   Apr 08, 2019 1904  Full code  Continuous     Apr 08, 2019 1903        Code Status History    This patient has a current code status but no historical code status.   Advance Care Planning Activity          Indications/Preliminary Diagnosis:   Consent: (Place X beside choice/s below)  The benefits, risks and possible complications of the procedure were        explained to:  ___ patient  ___ patient's family  ___ other:___________  who verbalized understanding and gave:  ___ verbal  ___ written  ___ verbal and written  ___ telephone  ___ other:________ consent.    x  Unable to obtain consent; procedure performed on emergent basis.     Other:       PRESEDATION ASSESSMENT: History and Physical has been performed. Patient meds and allergies have been reviewed. Presedation airway examination has been performed and documented. Baseline vital signs, sedation score, oxygenation status, and cardiac rhythm were reviewed. Patient was deemed to be in satisfactory condition to undergo the procedure.      PROCEDURE DETAILS: Timeout performed and correct patient, name, & ID confirmed. Following prep per Pulmonary policy, appropriate sedation was administered. The Bronchoscope was inserted in to oral cavity with bite block in place. Therapeutic aspiration of Tracheobronchial tree was performed.  Airway exam proceeded with findings, technical procedures, and specimen collection as noted below. At the end of exam the scope was withdrawn without incident. Impression and Plan as noted below.          Insertion Route (Place X beside choice below)   Nasal   Oral  x Endotracheal Tube   Tracheostomy   INTRAPROCEDURE  MEDICATIONS:  Sedative/Narcotic Amt Dose   Versed  mg   Fentanyl  mcg  Diprivan  mg       Medication Amt Dose  Medication Amt Dose  Lidocaine 1%  cc  Epinephrine 1:10,000 sol  cc  Xylocaine 4%  cc  Cocaine  cc       SPECIMENS (Sites): (Place X beside choice below)  Specimens Description  x No Specimens Obtained Minimal mucoid secretions    Washings    Lavage    Biopsies    Fine Needle Aspirates    Brushings    Sputum    FINDINGS:  Minimal mucoid secretions    ESTIMATED BLOOD LOSS: none COMPLICATIONS/RESOLUTION: none   IMPRESSION:POST-PROCEDURE DX:  Aspiration pneumonitis   RECOMMENDATION/PLAN:  Continue vent support Continue IV abx and BD therapy    Corrin Parker, M.D.  [facility]  Medical Director [place] Director [place] Department

## 2019-04-04 NOTE — Progress Notes (Signed)
9/16  No majors neuro events  Overnight  MRI brain No change. No acute intracranial finding. Moderate chronic small-vessel ischemic changes of the white matter.  MRI cervial spine: No primary cord pathology. C3-4: Left foraminal stenosis due to encroachment by uncovertebral osteophytes and facet arthropathy could affect the left C4 nerve. C4-5: Mild left foraminal narrowing due to uncovertebral prominence and facet degeneration, without definite compressive stenosis. C5-6: Broad-based, right posterolateral predominant disc herniation with effacement of the ventral subarachnoid space and slight deformity of the right side of the cord. Facet arthropathy on the right. Right foraminal stenosis could affect the right C6 nerve. C6-7: Chronic degenerative spondylosis. Narrowing of the ventral subarachnoid space but no compression of the cord. Bilateral foraminal narrowing could affect either C7 nerve.  EEG:  This study is suggestive of moderate to severe diffuse encephalopathy. No seizures or definite epileptiform discharges were seen throughout the recording.The excessive beta activity seen in the background is most likely due to the effect of medications like benzodiazepine and is a benign EEG pattern.  Neuro exam  Seems slightly better than yesterday Patient intubated, none verbal, open eyes to her name.  She has a forward gaze, would track Motor exam: minimal mvts in UEX to painful stimuli will move her LEX but more on the left leg. She will have facial grimaces symmetrical. to painful stimuli  RECS Continue Neuro protective measures including normothermia, normoglycemia, correct electrolytes/metaboic abnlites Continue Keprra Seizure precautions. Ativan prn for sz. Continue neuro monotoring Remaining of medcial treatment by primary team    9/15: No major events overnight Neuro exam: Patient intubated, none verbal will keep eye open . She is less interactive than  yeterday. She has a forward gaze, she does not track Motor exam: she is flaccid in her Uex, will move her LEX but more on the left leg. She will have grimaces to painful stimuli  Recs: Continue Neuro protective measures including normothermia, normoglycemia, correct electrolytes/metaboic abnlites Continue Keprra Will f/up on EEG Seizure precautions. Ativan prn for sz. M concerned about her neuro exam this AM. I will order a repeat MRI of brain and cervical spine with diffusion w and w/o. Remaining of medcial treatment by primary team   9/14: MRI On 9/13No abnormality seen to explain the clinical presentation. No acute or subacute infarction or any reversible finding. Moderate chronic small-vessel ischemic changes the cerebral hemispheric white matter.  EEG pending  Neuro exam: Intubated, on no sedation Mental Status: Patient would close eyes and open eyes intermittently to commands.  Grimaces to deep sternal rub.  Does not follow commands.  No verbalizations noted.  Cranial Nerves: II: patient does not respond confrontation bilaterally, pupils right 3 mm, left 3 mm,and reactive bilaterally III,IV,VI: Oculocephalic response present bilaterally.  V,VII: corneal reflex present bilaterally  VIII: patient does not respond to verbal stimuli IX,X: gag reflex unable to be tested, XI: trapezius strength unable to test bilaterally XII: tongue strength unable to test Motor: Will withdraw to painful stimuli on LLEx not UEX Sensory: Grimaces to painfull stimuli.  Deep Tendon Reflexes:  3+ throughout. Plantars: Mute bilaterally Cerebellar: Unable to perform  Neuro protective measures including normothermia, normoglycemia, correct electrolytes/metaboic abnlites Continue Keprra Will f/up on EEG Seizure precautions. Ativan prn for sz.    HPI: Sonia Farrell is an 68 y.o. female who is intubated and unable to provide any history therefore all history obtained from the chart.   Patient with a known history of hypothyroidism who presents via EMS due to  unresponsiveness.  Apparently patient was found unresponsive this morning by family.  Patient has had depression since her husband passed away a few weeks ago.  Patient has been taking an SSRI.  No report of suicidal ideations.  Patient was intubated due to inability to protect airway.  She is found to have pneumonia and started on antibiotics while in the emergency room.  Today noted to have eyes deviated upward with some unususal eye movements at times concerning for seizure.    Past Medical History:  Diagnosis Date  . Arthritis    fingers  . GERD (gastroesophageal reflux disease)   . Hyperlipidemia   . Hypothyroidism     Past Surgical History:  Procedure Laterality Date  . CATARACT EXTRACTION W/PHACO Left 12/26/2018   Procedure: CATARACT EXTRACTION PHACO AND INTRAOCULAR LENS PLACEMENT ([place]) LEFT;  Surgeon: Birder Robson, MD;  Location: [facility];  Service: Ophthalmology;  Laterality: Left;  . CATARACT EXTRACTION W/PHACO Right 01/23/2019   Procedure: CATARACT EXTRACTION PHACO AND INTRAOCULAR LENS PLACEMENT ([place]) RIGHT;  Surgeon: Birder Robson, MD;  Location: [facility];  Service: Ophthalmology;  Laterality: Right;  . COLONOSCOPY      Family History  Problem Relation Age of Onset  . Breast cancer Neg Hx     Social History:  reports that she has never smoked. She has never used smokeless tobacco. She reports current alcohol use of about 1.0 standard drinks of alcohol per week. No history on file for drug.  Allergies  Allergen Reactions  . Escitalopram Oxalate Other (See Comments)    MUSCLE SPASMS    Medications:  I have reviewed the patient's current medications. Prior to Admission:  Medications Prior to Admission  Medication Sig Dispense Refill Last Dose  . ALPRAZolam (XANAX) 0.5 MG tablet Take 0.5 mg by mouth as needed for anxiety.   Past Week at Unknown time  . levothyroxine  (SYNTHROID) 75 MCG tablet Take 75 mcg by mouth daily before breakfast.   Past Week at Unknown time  . pantoprazole (PROTONIX) 40 MG tablet Take 40 mg by mouth daily as needed.   Past Week at Unknown time  . PARoxetine (PAXIL) 10 MG tablet Take 10 mg by mouth daily.   Past Week at Unknown time  . rosuvastatin (CRESTOR) 10 MG tablet Take 10 mg by mouth daily.   Past Week at Unknown time  . acetaminophen (TYLENOL) 325 MG tablet Take 650 mg by mouth every 6 (six) hours as needed.   prn at prn   Scheduled: . chlorhexidine gluconate (MEDLINE KIT)  15 mL Mouth Rinse BID  . Chlorhexidine Gluconate Cloth  6 each Topical Daily  . enoxaparin (LOVENOX) injection  40 mg Subcutaneous Daily  . famotidine  20 mg Per Tube QHS  . feeding supplement (PRO-STAT SUGAR FREE 64)  30 mL Per Tube BID  . folic acid  1 mg Per Tube Daily  . free water  100 mL Per Tube Q8H  . lactulose  20 g Oral BID  . mouth rinse  15 mL Mouth Rinse 10 times per day  . multivitamin  15 mL Per Tube Daily  . thiamine  100 mg Per Tube Daily    ROS: Unable to provide  Physical Examination: Blood pressure 130/69, pulse 89, temperature 98.4 F (36.9 C), temperature source Oral, resp. rate 18, height 5' 7" (1.702 m), weight 78.6 kg, SpO2 93 %.  HEENT-  Normocephalic, no lesions, without obvious abnormality.  Normal external eye and conjunctiva.  Normal TM's  bilaterally.  Normal auditory canals and external ears. Normal external nose, mucus membranes and septum.  Normal pharynx. Cardiovascular- S1, S2 normal, pulses palpable throughout   Lungs- chest clear, no wheezing, rales, normal symmetric air entry Abdomen- soft, non-tender; bowel sounds normal; no masses,  no organomegaly Extremities- no edema Lymph-no adenopathy palpable Musculoskeletal-no joint tenderness, deformity or swelling Skin-warm and dry, no hyperpigmentation, vitiligo, or suspicious lesions  Neurological Examination   Mental Status: Patient does not respond to  verbal stimuli.  Grimaces to deep sternal rub.  Does not follow commands.  No verbalizations noted.  Cranial Nerves: II: patient does not respond confrontation bilaterally, pupils right 3 mm, left 3 mm,and reactive bilaterally III,IV,VI: Oculocephalic response present bilaterally.  V,VII: corneal reflex present bilaterally  VIII: patient does not respond to verbal stimuli IX,X: gag reflex unable to be tested, XI: trapezius strength unable to test bilaterally XII: tongue strength unable to test Motor: Extremities flaccid throughout.  No spontaneous movement noted.  No purposeful movements noted. Sensory: Grimaces with painful stimuli in all extremities.  Some withdrawal noted in the lower extremities with painful stimuli Deep Tendon Reflexes:  3+ throughout. Plantars: Mute bilaterally Cerebellar: Unable to perform   Laboratory Studies:   Basic Metabolic Panel: Recent Labs  Lab 04/02/2019 1112 04/01/19 0547 04/02/19 0501 04/03/19 0413 04/04/19 0534  NA 140 142 140 141 144  K 4.4 3.8 3.5 3.5 3.8  CL 107 111 110 110 110  CO2 21* 20* _0 GLUCOSE 148* 121* 137* 130* 133*  BUN _1 CREATININE 1.09* 0.92 0.84 0.68 0.69  CALCIUM 8.2* 7.7* 7.9* 8.3* 8.9  MG  --  1.7 2.1 2.1  --   PHOS  --  2.3* 1.6* 3.0  --     Liver Function Tests: Recent Labs  Lab 03/26/2019 1112 04/01/19 0547  AST 42* 72*  ALT 22 36  ALKPHOS 70 52  BILITOT 0.8 0.7  PROT 7.4 5.8*  ALBUMIN 4.0 2.9*   No results for input(s): LIPASE, AMYLASE in the last 168 hours. Recent Labs  Lab 04/03/19 0509  AMMONIA 44*    CBC: Recent Labs  Lab 04/06/2019 1112 04/01/19 0547 04/02/19 0501 04/03/19 0413 04/04/19 0534  WBC 8.2 8.6 12.7* 15.6* 11.6*  NEUTROABS 7.1  --   --   --   --   HGB 14.7 12.6 11.2* 10.7* 10.7*  HCT 44.5 38.9 33.7* 32.6* 33.1*  MCV 93.9 95.3 94.1 93.7 94.8  PLT 274 203 194 201 215    Cardiac Enzymes: No results for input(s): CKTOTAL, CKMB, CKMBINDEX, TROPONINI in the  last 168 hours.  BNP: Invalid input(s): POCBNP  CBG: Recent Labs  Lab 04/01/19 2329 04/02/19 0418 04/02/19 2349 04/03/19 0533 04/03/19 1858  GLUCAP 105* 105* 123* 106* 108*    Microbiology: Results for orders placed or performed during the hospital encounter of 04/17/2019  Urine culture     Status: None   Collection Time: 04/08/2019 11:12 AM   Specimen: Urine, Random  Result Value Ref Range Status   Specimen Description   Final    URINE, RANDOM Performed at [facility], [address], [place], [place] [address]    Special Requests   Final    NONE Performed at [facility], [address] [place], [place] [address]    Culture   Final    NO GROWTH Performed at [facility], [place] [address], [place],  [address]    Report  Status 04/02/2019 FINAL  Final  Blood Cultures (routine x 2)     Status: None (Preliminary result)   Collection Time: 04/15/2019 11:38 AM   Specimen: BLOOD  Result Value Ref Range Status   Specimen Description BLOOD BLOOD RIGHT HAND  Final   Special Requests   Final    BOTTLES DRAWN AEROBIC AND ANAEROBIC Blood Culture results may not be optimal due to an excessive volume of blood received in culture bottles   Culture   Final    NO GROWTH 4 DAYS Performed at [facility], [address], [place], [place] [address]    Report Status PENDING  Incomplete  SARS Coronavirus 2 [facility] order, Performed in [place] hospital lab) Nasopharyngeal     Status: None   Collection Time: 04/02/2019 11:38 AM   Specimen: Nasopharyngeal  Result Value Ref Range Status   SARS Coronavirus 2 NEGATIVE NEGATIVE Final    Comment: (NOTE) If result is NEGATIVE SARS-CoV-2 target nucleic acids are NOT DETECTED. The SARS-CoV-2 RNA is generally detectable in upper and lower  respiratory specimens during the acute phase of infection. The lowest  concentration of SARS-CoV-2 viral copies this assay can detect is 250  copies / mL. A negative  result does not preclude SARS-CoV-2 infection  and should not be used as the sole basis for treatment or other  patient management decisions.  A negative result may occur with  improper specimen collection / handling, submission of specimen other  than nasopharyngeal swab, presence of viral mutation(s) within the  areas targeted by this assay, and inadequate number of viral copies  (<250 copies / mL). A negative result must be combined with clinical  observations, patient history, and epidemiological information. If result is POSITIVE SARS-CoV-2 target nucleic acids are DETECTED. The SARS-CoV-2 RNA is generally detectable in upper and lower  respiratory specimens dur ing the acute phase of infection.  Positive  results are indicative of active infection with SARS-CoV-2.  Clinical  correlation with patient history and other diagnostic information is  necessary to determine patient infection status.  Positive results do  not rule out bacterial infection or co-infection with other viruses. If result is PRESUMPTIVE POSTIVE SARS-CoV-2 nucleic acids MAY BE PRESENT.   A presumptive positive result was obtained on the submitted specimen  and confirmed on repeat testing.  While 2019 novel coronavirus  (SARS-CoV-2) nucleic acids may be present in the submitted sample  additional confirmatory testing may be necessary for epidemiological  and / or clinical management purposes  to differentiate between  SARS-CoV-2 and other Sarbecovirus currently known to infect humans.  If clinically indicated additional testing with an alternate test  methodology [phone]) is advised. The SARS-CoV-2 RNA is generally  detectable in upper and lower respiratory sp ecimens during the acute  phase of infection. The expected result is Negative. Fact Sheet for Patients:  StrictlyIdeas.no Fact Sheet for Healthcare Providers: BankingDealers.co.za This test is not yet  approved or cleared by the [place] FDA and has been authorized for detection and/or diagnosis of SARS-CoV-2 by FDA under an Emergency Use Authorization (EUA).  This EUA will remain in effect (meaning this test can be used) for the duration of the COVID-19 declaration under Section 564(b)(1) of the Act, 21 U.S.C. section 360bbb-3(b)(1), unless the authorization is terminated or revoked sooner. Performed at [facility], [address] [place], [place] [address]   Blood Cultures (routine x 2)     Status: None (Preliminary result)   Collection Time: 04/08/2019  11:39 AM   Specimen: BLOOD  Result Value Ref Range Status   Specimen Description BLOOD BLOOD RIGHT FOREARM  Final   Special Requests   Final    BOTTLES DRAWN AEROBIC AND ANAEROBIC Blood Culture adequate volume   Culture   Final    NO GROWTH 4 DAYS Performed at [facility], [address] [place], [place] [address]    Report Status PENDING  Incomplete  MRSA PCR Screening     Status: None   Collection Time: 03/25/2019  5:46 PM   Specimen: Nasopharyngeal  Result Value Ref Range Status   MRSA by PCR NEGATIVE NEGATIVE Final    Comment:        The GeneXpert MRSA Assay (FDA approved for NASAL specimens only), is one component of a comprehensive MRSA colonization surveillance program. It is not intended to diagnose MRSA infection nor to guide or monitor treatment for MRSA infections. Performed at [facility], [address] [place], [place] [address]   Culture, respiratory     Status: None (Preliminary result)   Collection Time: 04/02/19  4:50 PM   Specimen: SPU  Result Value Ref Range Status   Specimen Description   Final    SPUTUM Performed at [facility], [address] [place], [place] [address]    Special Requests   Final    NONE Performed at [facility], [place], [place] [address]    Gram Stain   Final    ABUNDANT WBC PRESENT,  PREDOMINANTLY PMN NO SQUAMOUS EPITHELIAL CELLS SEEN FEW GRAM POSITIVE COCCI IN CLUSTERS    Culture   Final    FEW STAPHYLOCOCCUS AUREUS SUSCEPTIBILITIES TO FOLLOW Performed at [facility], [place] [address], [place], [place] [address]    Report Status PENDING  Incomplete    Coagulation Studies: No results for input(s): LABPROT, INR in the last 72 hours.  Urinalysis:  Recent Labs  Lab 04/13/2019 1112  COLORURINE YELLOW*  LABSPEC 1.012  PHURINE 5.0  GLUCOSEU NEGATIVE  HGBUR LARGE*  BILIRUBINUR NEGATIVE  KETONESUR NEGATIVE  PROTEINUR NEGATIVE  NITRITE NEGATIVE  LEUKOCYTESUR NEGATIVE    Lipid Panel:     Component Value Date/Time   TRIG 148 04/04/2019 0533    HgbA1C: No results found for: HGBA1C  Urine Drug Screen:      Component Value Date/Time   LABOPIA NONE DETECTED 03/21/2019 1112   COCAINSCRNUR NONE DETECTED 04/14/2019 1112   LABBENZ POSITIVE (A) 04/04/2019 1112   AMPHETMU NONE DETECTED 04/16/2019 1112   THCU NONE DETECTED 03/27/2019 1112   LABBARB NONE DETECTED 04/16/2019 1112    Alcohol Level:  Recent Labs  Lab 04/05/2019 1138  ETH <10    Other results: EKG: sinus tachycardia at 120 bpm.  Imaging: Mr Jeri Cos GM Contrast  Result Date: 04/03/2019 CLINICAL DATA:  In cephalopathy. EXAM: MRI HEAD WITHOUT AND WITH CONTRAST TECHNIQUE: Multiplanar, multiecho pulse sequences of the brain and surrounding structures were obtained without and with intravenous contrast. CONTRAST:  24m GADAVIST GADOBUTROL 1 MMOL/ML IV SOLN COMPARISON:  04/01/2019 FINDINGS: Brain: Diffusion imaging does not show any acute or subacute infarction. The brainstem and cerebellum are normal. Cerebral hemispheres show moderate chronic small-vessel ischemic changes of the white matter. No cortical or large vessel territory infarction. No mass lesion, hemorrhage, hydrocephalus or extra-axial collection. Vascular: Major vessels at the base of the brain show flow. Skull and upper cervical spine:  Negative Sinuses/Orbits: Clear/normal. Other: Nasopharyngeal fluid related to intubation. IMPRESSION: No  change. No acute intracranial finding. Moderate chronic small-vessel ischemic changes of the white matter. Electronically Signed   By: Nelson Chimes M.D.   On: 04/03/2019 18:58   Mr Cervical Spine W Wo Contrast  Result Date: 04/03/2019 CLINICAL DATA:  Myelopathy.  Weakness of the extremities. EXAM: MRI CERVICAL SPINE WITHOUT AND WITH CONTRAST TECHNIQUE: Multiplanar and multiecho pulse sequences of the cervical spine, to include the craniocervical junction and cervicothoracic junction, were obtained without and with intravenous contrast. CONTRAST:  57m GADAVIST GADOBUTROL 1 MMOL/ML IV SOLN COMPARISON:  None. FINDINGS: Alignment: Normal Vertebrae: No fracture or primary bone lesion. Cord: No primary cord lesion.  See below regarding stenosis. Posterior Fossa, vertebral arteries, paraspinal tissues: Negative Disc levels: No abnormality at foramen magnum, C1-2 or C2-3. C3-4: Uncovertebral hypertrophy and facet arthropathy on the left. Left foraminal stenosis could affect the C4 nerve. No central canal stenosis. C4-5: Endplate osteophytes and mild bulging of the disc. Facet arthropathy on the left. Mild left foraminal narrowing. C5-6: Broad-based, right posterolateral predominant disc herniation effaces the ventral subarachnoid space and indents the right side of the cord. Proximal foraminal encroachment on the right. Facet arthropathy on the right. Right foraminal narrowing could affect the C6 nerve. C6-7: Spondylosis with endplate osteophytes and shallow protrusion of the disc. Narrowing the ventral subarachnoid space but no compression of the cord. Bilateral foraminal narrowing. C7-T1: No disc pathology.  Mild facet hypertrophy. T1-2: Normal. No significant enhancing pathology. IMPRESSION: No primary cord pathology. C3-4: Left foraminal stenosis due to encroachment by uncovertebral osteophytes and facet  arthropathy could affect the left C4 nerve. C4-5: Mild left foraminal narrowing due to uncovertebral prominence and facet degeneration, without definite compressive stenosis. C5-6: Broad-based, right posterolateral predominant disc herniation with effacement of the ventral subarachnoid space and slight deformity of the right side of the cord. Facet arthropathy on the right. Right foraminal stenosis could affect the right C6 nerve. C6-7: Chronic degenerative spondylosis. Narrowing of the ventral subarachnoid space but no compression of the cord. Bilateral foraminal narrowing could affect either C7 nerve. Electronically Signed   By: MNelson ChimesM.D.   On: 04/03/2019 19:02   Dg Chest Port 1 View  Result Date: 04/03/2019 CLINICAL DATA:  Evaluate for pneumonia. EXAM: PORTABLE CHEST 1 VIEW COMPARISON:  Chest radiograph 04/02/2019, 04/01/2019 FINDINGS: Stable cardiomediastinal contours. The endotracheal tip terminates between the thoracic inlet and carina. Nasogastric tube courses below the diaphragm with distal aspect out of field of view. No pneumothorax or pleural effusion. There are persistent diffuse right-sided heterogeneous pulmonary opacities. The left lung is clear. Visualized bony skeleton is unremarkable. IMPRESSION: Persistent diffuse right-sided heterogeneous pulmonary opacities suspicious for infection. Electronically Signed   By: NAudie PintoM.D.   On: 04/03/2019 15:36

## 2019-04-04 NOTE — Progress Notes (Signed)
Pt. reintubated with an 8.0 ETT sesured at 24 cm without incident due to aspiration by Dr. Mortimer Fries.

## 2019-04-04 NOTE — Procedures (Signed)
Endotracheal Intubation: Patient immediately aspirated upon extubation, placed on 100% NRB Patient was emergently re-intubated, family at bedside  Patient required placement of an artificial airway secondary to Respiratory Failure  Consent: Emergent.   Hand washing performed prior to starting the procedure.   Medications administered for sedation prior to procedure:  Midazolam 4 mg IV,  Vecuronium 10 mg IV, Fentanyl 100 mcg IV.    A time out procedure was called and correct patient, name, & ID confirmed. Needed supplies and equipment were assembled and checked to include ETT, 10 ml syringe, Glidescope, Mac and Miller blades, suction, oxygen and bag mask valve, end tidal CO2 monitor.   Patient was positioned to align the mouth and pharynx to facilitate visualization of the glottis.   Heart rate, SpO2 and blood pressure was continuously monitored during the procedure. Pre-oxygenation was conducted prior to intubation and endotracheal tube was placed through the vocal cords into the trachea.     The artificial airway was placed under direct visualization via glidescope route using a 8.0 ETT on the first attempt.  ETT was secured at 23 cm mark.  Placement was confirmed by auscuitation of lungs with good breath sounds bilaterally and no stomach sounds.  Condensation was noted on endotracheal tube.   Pulse ox 98%.  CO2 detector in place with appropriate color change.   Complications: None .   Operator: Rockell Faulks.   Chest radiograph ordered and pending.   Comments: OGT placed via glidescope.  Corrin Parker, M.D.  [facility]  Medical Director Home Director [facility] Cardio-Pulmonary Department

## 2019-04-05 ENCOUNTER — Inpatient Hospital Stay: Payer: Medicare Other

## 2019-04-05 DIAGNOSIS — J15212 Pneumonia due to Methicillin resistant Staphylococcus aureus: Secondary | ICD-10-CM

## 2019-04-05 LAB — CULTURE, RESPIRATORY W GRAM STAIN

## 2019-04-05 LAB — CBC WITH DIFFERENTIAL/PLATELET
Abs Immature Granulocytes: 0.1 10*3/uL — ABNORMAL HIGH (ref 0.00–0.07)
Basophils Absolute: 0.1 10*3/uL (ref 0.0–0.1)
Basophils Relative: 1 %
Eosinophils Absolute: 0 10*3/uL (ref 0.0–0.5)
Eosinophils Relative: 0 %
HCT: 29.3 % — ABNORMAL LOW (ref 36.0–46.0)
Hemoglobin: 9.6 g/dL — ABNORMAL LOW (ref 12.0–15.0)
Immature Granulocytes: 1 %
Lymphocytes Relative: 9 %
Lymphs Abs: 0.8 10*3/uL (ref 0.7–4.0)
MCH: 31.2 pg (ref 26.0–34.0)
MCHC: 32.8 g/dL (ref 30.0–36.0)
MCV: 95.1 fL (ref 80.0–100.0)
Monocytes Absolute: 0.7 10*3/uL (ref 0.1–1.0)
Monocytes Relative: 7 %
Neutro Abs: 7.8 10*3/uL — ABNORMAL HIGH (ref 1.7–7.7)
Neutrophils Relative %: 82 %
Platelets: 217 10*3/uL (ref 150–400)
RBC: 3.08 MIL/uL — ABNORMAL LOW (ref 3.87–5.11)
RDW: 13.1 % (ref 11.5–15.5)
Smear Review: NORMAL
WBC: 9.5 10*3/uL (ref 4.0–10.5)
nRBC: 0.2 % (ref 0.0–0.2)

## 2019-04-05 LAB — CULTURE, BLOOD (ROUTINE X 2)
Culture: NO GROWTH
Culture: NO GROWTH
Special Requests: ADEQUATE

## 2019-04-05 LAB — GLUCOSE, CAPILLARY
Glucose-Capillary: 141 mg/dL — ABNORMAL HIGH (ref 70–99)
Glucose-Capillary: 153 mg/dL — ABNORMAL HIGH (ref 70–99)
Glucose-Capillary: 154 mg/dL — ABNORMAL HIGH (ref 70–99)
Glucose-Capillary: 179 mg/dL — ABNORMAL HIGH (ref 70–99)
Glucose-Capillary: 186 mg/dL — ABNORMAL HIGH (ref 70–99)
Glucose-Capillary: 200 mg/dL — ABNORMAL HIGH (ref 70–99)

## 2019-04-05 LAB — PHOSPHORUS: Phosphorus: 3.6 mg/dL (ref 2.5–4.6)

## 2019-04-05 LAB — AMMONIA: Ammonia: 20 umol/L (ref 9–35)

## 2019-04-05 LAB — BLOOD GAS, ARTERIAL
Acid-Base Excess: 3.5 mmol/L — ABNORMAL HIGH (ref 0.0–2.0)
Bicarbonate: 29.1 mmol/L — ABNORMAL HIGH (ref 20.0–28.0)
FIO2: 0.6
MECHVT: 450 mL
O2 Saturation: 98.1 %
PEEP: 5 cmH2O
Patient temperature: 37
RATE: 20 resp/min
pCO2 arterial: 47 mmHg (ref 32.0–48.0)
pH, Arterial: 7.4 (ref 7.350–7.450)
pO2, Arterial: 107 mmHg (ref 83.0–108.0)

## 2019-04-05 LAB — BASIC METABOLIC PANEL
Anion gap: 7 (ref 5–15)
BUN: 16 mg/dL (ref 8–23)
CO2: 28 mmol/L (ref 22–32)
Calcium: 8.4 mg/dL — ABNORMAL LOW (ref 8.9–10.3)
Chloride: 110 mmol/L (ref 98–111)
Creatinine, Ser: 0.79 mg/dL (ref 0.44–1.00)
GFR calc Af Amer: >60 mL/min (ref >60)
GFR calc non Af Amer: >60 mL/min (ref >60)
Glucose, Bld: 150 mg/dL — ABNORMAL HIGH (ref 70–99)
Potassium: 3.6 mmol/L (ref 3.5–5.1)
Sodium: 145 mmol/L (ref 135–145)

## 2019-04-05 LAB — MAGNESIUM: Magnesium: 2.4 mg/dL (ref 1.7–2.4)

## 2019-04-05 LAB — TRIGLYCERIDES: Triglycerides: 84 mg/dL (ref <150)

## 2019-04-05 LAB — PROCALCITONIN: Procalcitonin: 3.41 ng/mL

## 2019-04-05 IMAGING — DX DG CHEST 1V PORT
1 series · 1 of 1 positions shown · non-contrast
Comparison: [DATE]

CLINICAL DATA: Acute respiratory failure

EXAM:
PORTABLE CHEST 1 VIEW

[chest ap]
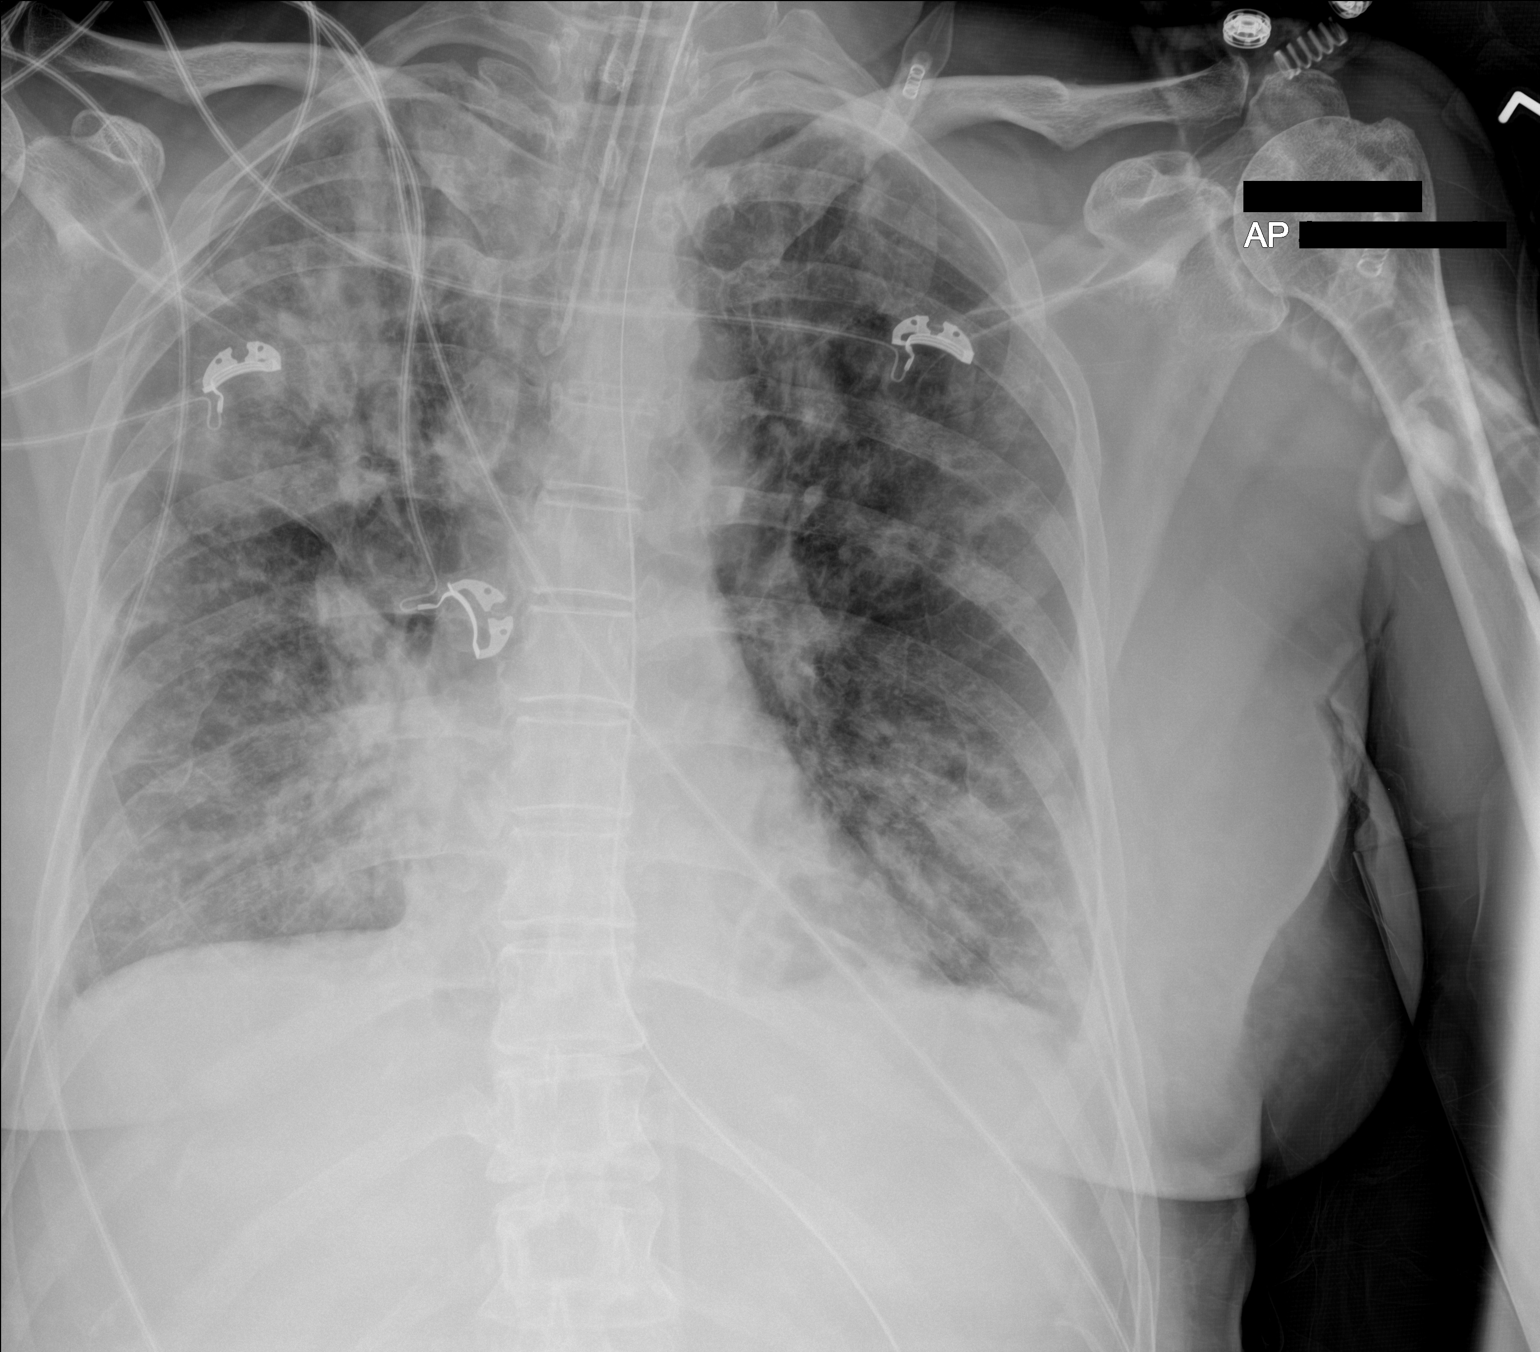

[1 of 1 positions shown; findings below may reference images not displayed]

FINDINGS: Endotracheal tube and gastric catheter are again seen and stable.
Cardiac shadow is stable. The lungs are well aerated bilaterally
with patchy bilateral airspace opacities similar to that seen on the
prior exam. No new focal infiltrate is seen. No sizable effusion is
noted.
IMPRESSION: Multifocal pneumonia stable from prior exam.

## 2019-04-05 MED ORDER — INSULIN ASPART 100 UNIT/ML ~~LOC~~ SOLN: 0.0000 [IU] | SUBCUTANEOUS | Status: DC

## 2019-04-05 MED ORDER — VITAL AF 1.2 CAL PO LIQD
1000.0000 mL | ORAL | Status: DC
  Administered 2019-04-05 – 2019-04-08 (×4): 1000 mL

## 2019-04-05 MED ORDER — VANCOMYCIN HCL IN DEXTROSE 750-5 MG/150ML-% IV SOLN
750.0000 mg | Freq: Two times a day (BID) | INTRAVENOUS | Status: DC
  Administered 2019-04-06 – 2019-04-11 (×12): 750 mg via INTRAVENOUS
  Filled 2019-04-05 (×14): qty 150

## 2019-04-05 MED ORDER — VANCOMYCIN HCL 1.5 G IV SOLR
1500.0000 mg | Freq: Once | INTRAVENOUS | Status: AC
  Administered 2019-04-05: 12:00:00 1500 mg via INTRAVENOUS
  Filled 2019-04-05: qty 1500

## 2019-04-05 MED ORDER — SODIUM CHLORIDE 0.9 % IV SOLN
INTRAVENOUS | Status: DC | PRN
  Administered 2019-04-05 – 2019-04-06 (×2): 250 mL via INTRAVENOUS

## 2019-04-05 MED ORDER — INSULIN ASPART 100 UNIT/ML ~~LOC~~ SOLN
0.0000 [IU] | SUBCUTANEOUS | Status: DC
  Administered 2019-04-05: 2 [IU] via SUBCUTANEOUS
  Administered 2019-04-05 (×3): 3 [IU] via SUBCUTANEOUS
  Administered 2019-04-05: 08:00:00 2 [IU] via SUBCUTANEOUS
  Administered 2019-04-06 – 2019-04-08 (×14): 3 [IU] via SUBCUTANEOUS
  Administered 2019-04-08: 04:00:00 2 [IU] via SUBCUTANEOUS
  Administered 2019-04-08 (×3): 3 [IU] via SUBCUTANEOUS
  Administered 2019-04-09 (×3): 2 [IU] via SUBCUTANEOUS
  Filled 2019-04-05 (×26): qty 1

## 2019-04-05 MED ORDER — PROPOFOL 1000 MG/100ML IV EMUL
5.0000 ug/kg/min | INTRAVENOUS | Status: DC
  Administered 2019-04-05: 21:00:00 35 ug/kg/min via INTRAVENOUS
  Administered 2019-04-05: 12:00:00 20 ug/kg/min via INTRAVENOUS
  Administered 2019-04-05: 16:00:00 40 ug/kg/min via INTRAVENOUS
  Administered 2019-04-06: 02:00:00 30 ug/kg/min via INTRAVENOUS
  Administered 2019-04-06: 10:00:00 25 ug/kg/min via INTRAVENOUS
  Filled 2019-04-05 (×5): qty 100

## 2019-04-05 MED ORDER — METHYLPREDNISOLONE SODIUM SUCC 40 MG IJ SOLR
20.0000 mg | Freq: Two times a day (BID) | INTRAMUSCULAR | Status: DC
  Administered 2019-04-05 – 2019-04-09 (×9): 20 mg via INTRAVENOUS
  Filled 2019-04-05 (×9): qty 1

## 2019-04-05 MED ORDER — POTASSIUM CHLORIDE 20 MEQ PO PACK
40.0000 meq | PACK | Freq: Once | ORAL | Status: AC
  Administered 2019-04-05: 16:00:00 40 meq via ORAL
  Filled 2019-04-05: qty 2

## 2019-04-05 NOTE — Progress Notes (Signed)
[facility] [facility] [place] NAME: Sonia Farrell    MR#:  [id number]  DATE OF BIRTH:  February 19, 1951  SUBJECTIVE:   Remains intubated this morning. Intermittently following commands.  REVIEW OF SYSTEMS:  ROS- unable to obtain due to intubation.  DRUG ALLERGIES:   Allergies  Allergen Reactions  . Escitalopram Oxalate Other (See Comments)    MUSCLE SPASMS   VITALS:  Blood pressure (!) 149/77, pulse (!) 101, temperature 100.1 F (37.8 C), temperature source Axillary, resp. rate (!) 22, height 5\' 7"  (1.702 m), weight 78.6 kg, SpO2 96 %. PHYSICAL EXAMINATION:  Physical Exam  GENERAL:  Laying in the bed with no acute distress.  HEENT: Head atraumatic, normocephalic. Pupils equal, round, reactive to light and accommodation. No scleral icterus. Extraocular muscles intact. Oropharynx and nasopharynx clear. ETT in place NECK:  Supple, no jugular venous distention. No thyroid enlargement. LUNGS: +diminished breath sounds throughout all lung fields. No wheezes, crackles, rhonchi. No use of accessory muscles of respiration.  CARDIOVASCULAR: RRR, S1, S2 normal. No murmurs, rubs, or gallops.  ABDOMEN: Soft, nontender, nondistended. Bowel sounds present.  EXTREMITIES: No pedal edema, cyanosis, or clubbing.  NEUROLOGIC: intubated. +eyes open. +inconsistently follows commands PSYCHIATRIC: intubated. SKIN: No obvious rash, lesion, or ulcer.  LABORATORY PANEL:  Female CBC Recent Labs  Lab 04/05/19 0452  WBC 9.5  HGB 9.6*  HCT 29.3*  PLT 217   ------------------------------------------------------------------------------------------------------------------ Chemistries  Recent Labs  Lab 04/01/19 0547  04/05/19 0452  NA 142   < > 145  K 3.8   < > 3.6  CL 111   < > 110  CO2 20*   < > 28  GLUCOSE 121*   < > 150*  BUN 21   < > 16  CREATININE 0.92   < > 0.79  CALCIUM 7.7*   < > 8.4*  MG 1.7   < > 2.4  AST 72*  --   --   ALT 36  --   --   ALKPHOS  52  --   --   BILITOT 0.7  --   --    < > = values in this interval not displayed.   RADIOLOGY:  Dg Chest Port 1 View  Result Date: 04/05/2019 CLINICAL DATA:  Acute respiratory failure EXAM: PORTABLE CHEST 1 VIEW COMPARISON:  04/04/2019 FINDINGS: Endotracheal tube and gastric catheter are again seen and stable. Cardiac shadow is stable. The lungs are well aerated bilaterally with patchy bilateral airspace opacities similar to that seen on the prior exam. No new focal infiltrate is seen. No sizable effusion is noted. IMPRESSION: Multifocal pneumonia stable from prior exam. Electronically Signed   By: Inez Catalina M.D.   On: 04/05/2019 03:52   ASSESSMENT AND PLAN:   Acute hypoxic respiratory failure due to aspiration pneumonia- in the setting of possible intentional overdose with benzos.  -Attempted extubation yesterday, but patient had emesis and aspiration, reintubated -Vent management per CCM  MRSA pneumonia and aspiration pneumonia- meeting sepsis criteria on admission with fever, tachycardia, and tachypnea. Procalcitonin elevated. Initially meeting sepsis criteria, but this has resolved.  -Sputum cultures growing MRSA -Changed to vancomycin -Continue IVFs -Blood cultures with no growth to date  Altered mental status due to unclear etiology- possible benzo overdose vs seizures. AMS has resolved and patient is intermittently following commands. -Continue keppra -Neurology following -MRI brain unremarkable -Repeat MRI brain and c-spine were unremarkable except for foraminal stenosis and some disc herniatiosn -EEG with moderate  to severe diffuse encephalopathy  All the records are reviewed and case discussed with Care Management/Social Worker. Management plans discussed with the patient, family and they are in agreement.  CODE STATUS: Full Code  TOTAL TIME TAKING CARE OF THIS PATIENT: 36 minutes.   More than 50% of the time was spent in counseling/coordination of care: YES   POSSIBLE D/C unknown, DEPENDING ON CLINICAL CONDITION.   Jinny BlossomKaty D Mayo M.D on 04/05/2019 at 3:15 PM  Between 7am to 6pm - Pager - [phone]  After 6pm go to [email or web address] - Social research officer, governmentpassword EPAS [facility]  [facility]  Office  [phone]  CC: Primary care physician; Jerl MinaHedrick, James, MD  Note: This dictation was prepared with Dragon dictation along with smaller phrase technology. Any transcriptional errors that result from this process are unintentional.

## 2019-04-05 NOTE — Consult Note (Signed)
Pharmacy Antibiotic Note  Sonia Farrell is a 68 y.o. female admitted on 03/23/2019 with aspiration pneumonia. CXR significant for severe multilobar pneumonia in right lung. Patient was extubated 9/16 and vomited. Increased work of breathing on non-rebreather mask and was subsequently re-intubated. Pharmacy has been consulted for vancomycin dosing.   Patient has completed 5 days of therapy with Unasyn for aspiration PNA. Sputum cultures from 9/14 growing MRSA. Improving leukocytosis today and afebrile last 24h. Will discontinue Unasyn and start vancomycin to cover identified pathogen.   Plan: Discontinue Unasyn 3g q6h  Give LD of vancomycin 1500 mg x1 followed by maintenance dose of  Vancomycin 750 mg IV q12h. Goal AUC 400-550. Expected AUC: 446.1, trough 13.5 SCr used: 0.79  Plan to order levels in 2-3 days. Will follow daily Scr per protocol. Continue to monitor labs, vitals, cultures for signs of clinical improvement with antibiotic therapy.   Height: 5\' 7"  (170.2 cm) Weight: 173 lb 4.5 oz (78.6 kg) IBW/kg (Calculated) : 61.6  Temp (24hrs), Avg:99.3 F (37.4 C), Min:98.4 F (36.9 C), Max:100.2 F (37.9 C)  Recent Labs  Lab 11-Apr-2019 1138 April 11, 2019 1330 04/01/19 0547 04/02/19 0501 04/03/19 0413 04/04/19 0534 04/05/19 0452  WBC  --   --  8.6 12.7* 15.6* 11.6* 9.5  CREATININE  --   --  0.92 0.84 0.68 0.69 0.79  LATICACIDVEN 4.0* 3.7*  --   --   --   --   --     Estimated Creatinine Clearance: 72.7 mL/min (by C-G formula based on SCr of 0.79 mg/dL).    Allergies  Allergen Reactions  . Escitalopram Oxalate Other (See Comments)    MUSCLE SPASMS    Antimicrobials this admission: 9/12 Azithromycin and Ceftriaxone x 1 9/12 Unasyn >>9/17 9/17 Vancomycin >>   Dose adjustments this admission: None  Microbiology results: 9/16 C diff: negative  9/14 sputum cx: MRSA 9/12 BCx: no growth 5 days  9/12 UCx: no growth 9/12 MRSA PCR: negative  9/12 COVID: negative   Thank  you for allowing pharmacy to be a part of this patient's care.  [place] Resident 04/05/2019 1:53 PM

## 2019-04-05 NOTE — Progress Notes (Addendum)
Name: Sonia Farrell MRN: [id number] DOB: 1951/03/20     CONSULTATION DATE: 04/14/2019  CHIEF COMPLAINT:  Acute hypoxic respiratory failure  SIGNIFICANT EVENTS/STUDIES:  9/12 admitted to ICU via ED, intubated and unresponsive 9/12CXR suspicious for right lobe CAP, Lactic acid elevated 9/12Possible seizure noted, CT head showed no intracranial abnormalities 9/13MRI brain showed no abnormalities 9/15Remains minimally responsive and intubated without sedation 9/15 EEG showed severe encephalopathy with no seizure activity 9/16 Significantly more responsive, following commands 9/16 Extubated in ICU. Immediate copious emesis and aspiration occurred 9/16 Re-intubated and bronchoscopy performed to clear aspirate +MRSA pneunomonia   HISTORY OF PRESENT ILLNESS:  Sonia Farrell is a 68 yo female with history of depression, hyperlipidemia, and hypothyroidism presented to the ED unresponsive in the setting of a possible drug overdose. CXR suspicious for right lobe aspiration pneumonia. She was intubated and brought to ICU where she has become slowly more responsive. On 9/16 she was extubated but had to be re-intubated after aspirating on emesis from tube feeds.   Today Pt remains intubated and off sedation. She opens her eyes and follows commands, intermittent agitation.  PAST MEDICAL HISTORY :   has a past medical history of Arthritis, GERD (gastroesophageal reflux disease), Hyperlipidemia, and Hypothyroidism.  has a past surgical history that includes Colonoscopy; Cataract extraction w/PHACO (Left, 12/26/2018); and Cataract extraction w/PHACO (Right, 01/23/2019). Prior to Admission medications   Medication Sig Start Date End Date Taking? Authorizing Provider  ALPRAZolam Duanne Moron) 0.5 MG tablet Take 0.5 mg by mouth as needed for anxiety.   Yes [provider]  levothyroxine (SYNTHROID) 75 MCG tablet Take 75 mcg by mouth daily before breakfast.   Yes [provider]   pantoprazole (PROTONIX) 40 MG tablet Take 40 mg by mouth daily as needed.   Yes [provider]  PARoxetine (PAXIL) 10 MG tablet Take 10 mg by mouth daily.   Yes [provider]  rosuvastatin (CRESTOR) 10 MG tablet Take 10 mg by mouth daily.   Yes [provider]  acetaminophen (TYLENOL) 325 MG tablet Take 650 mg by mouth every 6 (six) hours as needed.    [provider]   Allergies  Allergen Reactions  . Escitalopram Oxalate Other (See Comments)    MUSCLE SPASMS    REVIEW OF SYSTEMS:   Unable to obtain due to critical illness  VITAL SIGNS: Temp:  [98.3 F (36.8 C)-100.2 F (37.9 C)] 100.2 F (37.9 C) (09/17 0800) Pulse Rate:  [84-121] 102 (09/17 0800) Resp:  [16-34] 17 (09/17 0800) BP: (100-151)/(46-78) 106/57 (09/17 0800) SpO2:  [90 %-99 %] 94 % (09/17 0803) FiO2 (%):  [40 %-100 %] 40 % (09/17 0803)   I/O last 3 completed shifts: In: 5051.9 [I.V.:230; NG/GT:3862.8; IV Piggyback:959.2] Out: 2085 [Urine:1685; Stool:400] Total I/O In: -  Out: 35 [Urine:35]   SpO2: 94 % O2 Flow Rate (L/min): 15 L/min FiO2 (%): 40 %   Physical Examination:  GENERAL:critically ill appearing, +resp distress HEAD: Normocephalic, atraumatic.  EYES: Pupils equal, round, reactive to light.  No scleral icterus.  MOUTH: Moist mucosal membrane. NECK: Supple. No JVD.  PULMONARY: lungs clear to auscultation CARDIOVASCULAR: S1 and S2. Regular rate and rhythm. No murmurs, rubs, or gallops.  GASTROINTESTINAL: Soft, nontender, -distended. No masses. Positive bowel sounds. No hepatosplenomegaly.  MUSCULOSKELETAL: No swelling, clubbing, or edema.  NEUROLOGIC: opens eyes to voice, follows commands SKIN:intact,warm,dry  I personally reviewed lab work that was obtained in last 24 hrs. CXR Independently reviewed  MEDICATIONS: I have reviewed all  medications and confirmed regimen as documented   CULTURE RESULTS   Recent Results (from the past 240 hour(s))   Urine culture     Status: None   Collection Time: 04/16/2019 11:12 AM   Specimen: Urine, Random  Result Value Ref Range Status   Specimen Description   Final    URINE, RANDOM Performed at [facility], [address] [place], [place] [address]    Special Requests   Final    NONE Performed at [facility], [address] [place], [place] [address]    Culture   Final    NO GROWTH Performed at [facility], [place] [address] [place], [place] [address]    Report Status 04/02/2019 FINAL  Final  Blood Cultures (routine x 2)     Status: None   Collection Time: 03/26/2019 11:38 AM   Specimen: BLOOD  Result Value Ref Range Status   Specimen Description BLOOD BLOOD RIGHT HAND  Final   Special Requests   Final    BOTTLES DRAWN AEROBIC AND ANAEROBIC Blood Culture results may not be optimal due to an excessive volume of blood received in culture bottles   Culture   Final    NO GROWTH 5 DAYS Performed at [facility], [address] [place], [place] [address]    Report Status 04/05/2019 FINAL  Final  SARS Coronavirus 2 [facility] order, Performed in [place] hospital lab) Nasopharyngeal     Status: None   Collection Time: 04/01/2019 11:38 AM   Specimen: Nasopharyngeal  Result Value Ref Range Status   SARS Coronavirus 2 NEGATIVE NEGATIVE Final    Comment: (NOTE) If result is NEGATIVE SARS-CoV-2 target nucleic acids are NOT DETECTED. The SARS-CoV-2 RNA is generally detectable in upper and lower  respiratory specimens during the acute phase of infection. The lowest  concentration of SARS-CoV-2 viral copies this assay can detect is 250  copies / mL. A negative result does not preclude SARS-CoV-2 infection  and should not be used as the sole basis for treatment or other  patient management decisions.  A negative result may occur with  improper specimen collection / handling, submission of specimen other  than nasopharyngeal swab, presence of viral  mutation(s) within the  areas targeted by this assay, and inadequate number of viral copies  (<250 copies / mL). A negative result must be combined with clinical  observations, patient history, and epidemiological information. If result is POSITIVE SARS-CoV-2 target nucleic acids are DETECTED. The SARS-CoV-2 RNA is generally detectable in upper and lower  respiratory specimens dur ing the acute phase of infection.  Positive  results are indicative of active infection with SARS-CoV-2.  Clinical  correlation with patient history and other diagnostic information is  necessary to determine patient infection status.  Positive results do  not rule out bacterial infection or co-infection with other viruses. If result is PRESUMPTIVE POSTIVE SARS-CoV-2 nucleic acids MAY BE PRESENT.   A presumptive positive result was obtained on the submitted specimen  and confirmed on repeat testing.  While 2019 novel coronavirus  (SARS-CoV-2) nucleic acids may be present in the submitted sample  additional confirmatory testing may be necessary for epidemiological  and / or clinical management purposes  to differentiate between  SARS-CoV-2 and other Sarbecovirus currently known to infect humans.  If clinically indicated additional testing with an alternate test  methodology [phone]) is advised. The SARS-CoV-2 RNA is generally  detectable in upper and lower respiratory sp ecimens during the acute  phase of infection. The expected result is Negative. Fact Sheet for Patients:  StrictlyIdeas.no Fact Sheet for Healthcare Providers: BankingDealers.co.za This test is not yet approved or cleared by the [place] FDA and has been authorized for detection and/or diagnosis of SARS-CoV-2 by FDA under an Emergency Use Authorization (EUA).  This EUA will remain in effect (meaning this test can be used) for the duration of the COVID-19 declaration under Section 564(b)(1)  of the Act, 21 U.S.C. section 360bbb-3(b)(1), unless the authorization is terminated or revoked sooner. Performed at [facility], [address] [place], [place] [address]   Blood Cultures (routine x 2)     Status: None   Collection Time: 03/23/2019 11:39 AM   Specimen: BLOOD  Result Value Ref Range Status   Specimen Description BLOOD BLOOD RIGHT FOREARM  Final   Special Requests   Final    BOTTLES DRAWN AEROBIC AND ANAEROBIC Blood Culture adequate volume   Culture   Final    NO GROWTH 5 DAYS Performed at [facility], [address] [place], [place] [address]    Report Status 04/05/2019 FINAL  Final  MRSA PCR Screening     Status: None   Collection Time: 03/27/2019  5:46 PM   Specimen: Nasopharyngeal  Result Value Ref Range Status   MRSA by PCR NEGATIVE NEGATIVE Final    Comment:        The GeneXpert MRSA Assay (FDA approved for NASAL specimens only), is one component of a comprehensive MRSA colonization surveillance program. It is not intended to diagnose MRSA infection nor to guide or monitor treatment for MRSA infections. Performed at [facility], [address] [place], [place] [address]   Culture, respiratory     Status: None   Collection Time: 04/02/19  4:50 PM   Specimen: SPU  Result Value Ref Range Status   Specimen Description   Final    SPUTUM Performed at [facility], [address], [place], [place] [address]    Special Requests   Final    NONE Performed at [facility], [address] [place], [place] [address]    Gram Stain   Final    ABUNDANT WBC PRESENT, PREDOMINANTLY PMN NO SQUAMOUS EPITHELIAL CELLS SEEN FEW GRAM POSITIVE COCCI IN CLUSTERS Performed at [facility], [place] [address], [place], [place] [address]    Culture FEW METHICILLIN RESISTANT STAPHYLOCOCCUS AUREUS  Final   Report Status 04/05/2019 FINAL  Final   Organism ID, Bacteria METHICILLIN RESISTANT STAPHYLOCOCCUS AUREUS  Final       Susceptibility   Methicillin resistant staphylococcus aureus - MIC*    CIPROFLOXACIN >=8 RESISTANT Resistant     ERYTHROMYCIN >=8 RESISTANT Resistant     GENTAMICIN <=0.5 SENSITIVE Sensitive     OXACILLIN >=4 RESISTANT Resistant     TETRACYCLINE <=1 SENSITIVE Sensitive     VANCOMYCIN 1 SENSITIVE Sensitive     TRIMETH/SULFA >=320 RESISTANT Resistant     CLINDAMYCIN <=0.25 SENSITIVE Sensitive     RIFAMPIN <=0.5 SENSITIVE Sensitive     Inducible Clindamycin NEGATIVE Sensitive     * FEW METHICILLIN RESISTANT STAPHYLOCOCCUS AUREUS  C difficile quick scan w PCR reflex     Status: None   Collection Time: 04/04/19 12:54 PM   Specimen: STOOL  Result Value Ref Range Status   C Diff antigen NEGATIVE NEGATIVE Final   C Diff toxin NEGATIVE NEGATIVE Final   C Diff interpretation No C. difficile detected.  Final  Comment: Performed at [facility], [address] [place], [place] [address]          IMAGING    Dg Abd 1 View  Result Date: 04/04/2019 CLINICAL DATA:  Nasogastric tube placement. EXAM: ABDOMEN - 1 VIEW COMPARISON:  One view abdomen 03/30/2019. FINDINGS: 1234 hours. Nasogastric tube projects over the mid lumbar spine, consistent with location in the mid stomach. No significant residual gastric distention. There is no dilated small bowel within the visualized upper abdomen. Patchy airspace opacities are present at both lung bases. These are better seen on concurrent chest radiographs. IMPRESSION: Nasogastric tube projects to the level of the mid stomach. Electronically Signed   By: Richardean Sale M.D.   On: 04/04/2019 12:57   Dg Chest Port 1 View  Result Date: 04/05/2019 CLINICAL DATA:  Acute respiratory failure EXAM: PORTABLE CHEST 1 VIEW COMPARISON:  04/04/2019 FINDINGS: Endotracheal tube and gastric catheter are again seen and stable. Cardiac shadow is stable. The lungs are well aerated bilaterally with patchy bilateral airspace opacities similar to that seen on the  prior exam. No new focal infiltrate is seen. No sizable effusion is noted. IMPRESSION: Multifocal pneumonia stable from prior exam. Electronically Signed   By: Inez Catalina M.D.   On: 04/05/2019 03:52   Dg Chest Port 1 View  Result Date: 04/04/2019 CLINICAL DATA:  Multilobar pneumonia.  Intubated patient. EXAM: PORTABLE CHEST 1 VIEW COMPARISON:  Radiographs 04/03/2019 and 04/02/2019. FINDINGS: 1233 hours. Mild patient rotation to the right. Tip of the endotracheal tube is in the mid trachea. Enteric tube projects below the diaphragm, tip not visualized. The heart size and mediastinal contours are stable. Right upper lobe airspace disease appears unchanged. There is slight worsening of the bibasilar airspace opacities. There are possible small bilateral pleural effusions. No pneumothorax. The bones appear unchanged. IMPRESSION: 1. Stable position of the endotracheal and nasogastric tubes. 2. Worsening basilar components of bilateral airspace opacities consistent with multilobar pneumonia. Possible small bilateral pleural effusions. Electronically Signed   By: Richardean Sale M.D.   On: 04/04/2019 12:59        Indwelling Urinary Catheter continued, requirement due to   Reason to continue Indwelling Urinary Catheter strict Intake/Output monitoring for hemodynamic instability         Ventilator continued, requirement due to severe respiratory failure   Ventilator Sedation RASS 0 to -2      ASSESSMENT AND PLAN SYNOPSIS  68 yo F admitted to ICU for acute hypoxic respiratory failure from acute right lobe aspiration pneumonia s/t possible ETOH abuse and possible intentional drug OD from benzodiazepines causing acute metabolic encephalopathy. Post re-intubation CXR shows multilobar aspiration pneumonia.  Severe ACUTE Hypoxic and Hypercapnic Respiratory Failure -continue Full MV support -continue Bronchodilator Therapy -Wean Fio2 and PEEP as tolerated -will perform SAT/SBT when respiratory  parameters are met  INFECTIOUS DISEASE-ASPIRATION pneumonia multilobar +MRSA pneumonia -change abx to vancomycin - follow up cultures - follow up chest xray  NEUROLOGY - intubated and sedated - minimal sedation to achieve a RASS goal: -1 -stop Keppra Wake up assessment pending  CARDIAC ICU monitoring  GI GI PROPHYLAXIS as indicated  NUTRITIONAL STATUS DIET-->TF's as tolerated Constipation protocol as indicated   ENDO - will use ICU hypoglycemic\Hyperglycemia protocol if needed  ELECTROLYTES -follow labs as needed -replace as needed -pharmacy consultation and following   DVT/GI PRX ordered TRANSFUSIONS AS NEEDED MONITOR FSBS ASSESS the need for LABS    Critical Care Time devoted to patient care services described in this  note is 35 minutes.   Overall, patient is critically ill, prognosis is guarded.  Patient with Multiorgan failure and at high risk for cardiac arrest and death.    Corrin Parker, M.D.  [facility]  Medical Director [place] Director [facility] Cardio-Pulmonary Department

## 2019-04-05 NOTE — Progress Notes (Signed)
Nutrition Follow-up  DOCUMENTATION CODES:   Not applicable  INTERVENTION:  Initiate new goal TF regimen of Vital AF 1.2 Cal at 40 mL/hr (960 mL goal daily volume) + Pro-Stat 30 mL BID per tube. Provides 1352 kcal, 102 grams of protein, 778 mL H2O daily. With current propofol rate provides 1788 kcal daily.  With free water flush of 100 mL Q8hrs patient is receiving a total of 1078 mL H2O daily including water in tube feed regimen.  Continue liquid MVI daily per tube, folic acid 1 mg daily per tube, thiamine 100 mg daily per tube.   NUTRITION DIAGNOSIS:   Inadequate oral intake related to inability to eat as evidenced by NPO status.  Ongoing - addressing with TF regimen.  GOAL:   Provide needs based on ASPEN/SCCM guidelines  Ongoing - met with TF regimen.  MONITOR:   Vent status, Weight trends, Labs, TF tolerance, I & O's  REASON FOR ASSESSMENT:   Ventilator, Consult Enteral/tube feeding initiation and management  ASSESSMENT:   68 year old female with PMHx of hypothyroidism, GERD, arthritis, HLD admitted with decreased LOC and acute hypoxemic respiratory failure requiring intubation on 9/12, with PNA and sepsis.  Patient was extubated yesterday. She immediately had copious emesis and an aspiration event and was re-intubated. She is now on PRVC mode with FiO2 40% and PEEP 5 cmH2O. Tube feeds were restarted and patient is tolerating.   Enteral Access: OGT; terminates in mid stomach per abdominal x-ray 9/16  MAP: 69-125 mmHg  Patient is currently intubated on ventilator support Ve: 9.2 L/min Temp (24hrs), Avg:99.4 F (37.4 C), Min:98.4 F (36.9 C), Max:100.2 F (37.9 C)  Propofol: 16.51 mL/hr (436 kcal daily)  Medications reviewed and include: famotidine, folic acid 1 mg daily, free water flush 100 mL Q8hrs, Novolog 0-15 units Q4hrs, Solu-Medrol 20 mg Q12hrs IV, liquid MVI daily per tube, thiamine 100 mg daily, Precedex gtt, propofol gtt, vancomycin.  Labs reviewed:  CBG 141-153.  I/O: 1135 mL UOP Yesterday (0.6 mL/kg/hr)  No weight taken since 9/12 to trend.  Discussed with RN and on rounds.  Diet Order:   Diet Order    None     EDUCATION NEEDS:   No education needs have been identified at this time  Skin:  Skin Assessment: Reviewed RN Assessment  Last BM:  04/05/2019 - type 7  Height:   Ht Readings from Last 1 Encounters:  03/25/2019 '5\' 7"'  (1.702 m)   Weight:   Wt Readings from Last 1 Encounters:  04/16/2019 78.6 kg   Ideal Body Weight:  61.4 kg  BMI:  Body mass index is 27.14 kg/m.  Estimated Nutritional Needs:   Kcal:  1701 (PSU 2003b w/ MSJ 1353, Ve 9.2, Tmax 37.9)  Protein:  94-118 grams (1.2-1.5 grams/kg)  Fluid:  2-2.4 L/day  Willey Blade, MS, RD, LDN Office: [phone] Pager: [phone] After Hours/Weekend Pager: [phone]

## 2019-04-05 NOTE — Plan of Care (Signed)
  Problem: Clinical Measurements: Goal: Will remain free from infection Outcome: Progressing Goal: Respiratory complications will improve Outcome: Progressing Note: Oxygen on ventilator weaned from 100% to 60% Goal: Cardiovascular complication will be avoided Outcome: Progressing   Problem: Nutrition: Goal: Adequate nutrition will be maintained Outcome: Progressing Note: Tube feeds running    Problem: Elimination: Goal: Will not experience complications related to bowel motility Outcome: Progressing Note: Pt has had loose large BM, has rectal tube placed for scheduled lactulose Goal: Will not experience complications related to urinary retention Outcome: Progressing Note: Foley with adequate urine output   Problem: Pain Managment: Goal: General experience of comfort will improve Outcome: Progressing Note: Pt on precedex gtt, prn fentanyl given throughout shift for signs of discomfort and pain   Problem: Skin Integrity: Goal: Risk for impaired skin integrity will decrease Outcome: Progressing

## 2019-04-05 NOTE — Plan of Care (Signed)

## 2019-04-05 NOTE — Consult Note (Signed)
PHARMACY CONSULT NOTE - FOLLOW UP  Pharmacy Consult for Electrolyte Monitoring and Replacement   Recent Labs: Potassium (mmol/L)  Date Value  04/05/2019 3.6   Magnesium (mg/dL)  Date Value  04/05/2019 2.4   Calcium (mg/dL)  Date Value  04/05/2019 8.4 (L)   Albumin (g/dL)  Date Value  04/01/2019 2.9 (L)   Phosphorus (mg/dL)  Date Value  04/05/2019 3.6   Sodium (mmol/L)  Date Value  04/05/2019 145    Corrected calcium: 9.3 mg/dL  Assessment: 68 y/o F with acute hypoxic respiratory failure due to aspiration pneumonia in the setting of possible intentional overdose with benzos and alcohol. Patient was extubated 9/16 and vomited. Increased work of breathing on non-rebreather mask and was subsequently re-intubated. Patient with some diarrhea yesterday but C. Diff panel is negative. Patient is on SSI.   Goal of Therapy:  K ~4, Mg ~2, Phos ~2.5-4.5   Plan:  KCl packets 40 mEq PO x 1.  Re-check BMP, Mg, and Phos tomorrow AM.   Marton Redwood Dear [name], pharmacy student 04/05/2019 2:03 PM

## 2019-04-06 LAB — CBC WITH DIFFERENTIAL/PLATELET
Abs Immature Granulocytes: 0.35 10*3/uL — ABNORMAL HIGH (ref 0.00–0.07)
Basophils Absolute: 0 10*3/uL (ref 0.0–0.1)
Basophils Relative: 0 %
Eosinophils Absolute: 0 10*3/uL (ref 0.0–0.5)
Eosinophils Relative: 0 %
HCT: 31.7 % — ABNORMAL LOW (ref 36.0–46.0)
Hemoglobin: 10.1 g/dL — ABNORMAL LOW (ref 12.0–15.0)
Immature Granulocytes: 3 %
Lymphocytes Relative: 6 %
Lymphs Abs: 0.7 10*3/uL (ref 0.7–4.0)
MCH: 31 pg (ref 26.0–34.0)
MCHC: 31.9 g/dL (ref 30.0–36.0)
MCV: 97.2 fL (ref 80.0–100.0)
Monocytes Absolute: 0.3 10*3/uL (ref 0.1–1.0)
Monocytes Relative: 3 %
Neutro Abs: 10.5 10*3/uL — ABNORMAL HIGH (ref 1.7–7.7)
Neutrophils Relative %: 88 %
Platelets: 299 10*3/uL (ref 150–400)
RBC: 3.26 MIL/uL — ABNORMAL LOW (ref 3.87–5.11)
RDW: 13.6 % (ref 11.5–15.5)
WBC: 11.8 10*3/uL — ABNORMAL HIGH (ref 4.0–10.5)
nRBC: 0 % (ref 0.0–0.2)

## 2019-04-06 LAB — GLUCOSE, CAPILLARY
Glucose-Capillary: 143 mg/dL — ABNORMAL HIGH (ref 70–99)
Glucose-Capillary: 186 mg/dL — ABNORMAL HIGH (ref 70–99)
Glucose-Capillary: 193 mg/dL — ABNORMAL HIGH (ref 70–99)
Glucose-Capillary: 175 mg/dL — ABNORMAL HIGH (ref 70–99)
Glucose-Capillary: 174 mg/dL — ABNORMAL HIGH (ref 70–99)
Glucose-Capillary: 155 mg/dL — ABNORMAL HIGH (ref 70–99)

## 2019-04-06 LAB — BASIC METABOLIC PANEL
Anion gap: 9 (ref 5–15)
BUN: 23 mg/dL (ref 8–23)
CO2: 26 mmol/L (ref 22–32)
Calcium: 8.7 mg/dL — ABNORMAL LOW (ref 8.9–10.3)
Chloride: 111 mmol/L (ref 98–111)
Creatinine, Ser: 0.76 mg/dL (ref 0.44–1.00)
GFR calc Af Amer: >60 mL/min (ref >60)
GFR calc non Af Amer: >60 mL/min (ref >60)
Glucose, Bld: 216 mg/dL — ABNORMAL HIGH (ref 70–99)
Potassium: 4.4 mmol/L (ref 3.5–5.1)
Sodium: 146 mmol/L — ABNORMAL HIGH (ref 135–145)

## 2019-04-06 LAB — HEMOGLOBIN A1C
Hgb A1c MFr Bld: 5.6 % (ref 4.8–5.6)
Mean Plasma Glucose: 114 mg/dL

## 2019-04-06 LAB — MAGNESIUM: Magnesium: 2.6 mg/dL — ABNORMAL HIGH (ref 1.7–2.4)

## 2019-04-06 LAB — PHOSPHORUS: Phosphorus: 3.5 mg/dL (ref 2.5–4.6)

## 2019-04-06 LAB — PROCALCITONIN: Procalcitonin: 2.04 ng/mL

## 2019-04-06 MED ORDER — FENTANYL 2500MCG IN NS 250ML (10MCG/ML) PREMIX INFUSION
0.0000 ug/h | INTRAVENOUS | Status: DC
  Administered 2019-04-06: 25 ug/h via INTRAVENOUS
  Administered 2019-04-06 – 2019-04-07 (×3): 275 ug/h via INTRAVENOUS
  Administered 2019-04-08 – 2019-04-09 (×4): 225 ug/h via INTRAVENOUS
  Administered 2019-04-10 (×3): 250 ug/h via INTRAVENOUS
  Administered 2019-04-11: 08:00:00 200 ug/h via INTRAVENOUS
  Filled 2019-04-06 (×12): qty 250

## 2019-04-06 MED ORDER — FREE WATER
150.0000 mL | Freq: Three times a day (TID) | Status: DC
  Administered 2019-04-06 – 2019-04-08 (×5): 150 mL

## 2019-04-06 MED ORDER — SODIUM CHLORIDE 0.9 % IV SOLN
3.0000 g | Freq: Four times a day (QID) | INTRAVENOUS | Status: DC
  Administered 2019-04-06 – 2019-04-09 (×11): 3 g via INTRAVENOUS
  Filled 2019-04-06: qty 3
  Filled 2019-04-06 (×2): qty 8
  Filled 2019-04-06 (×3): qty 3
  Filled 2019-04-06 (×2): qty 8
  Filled 2019-04-06 (×4): qty 3
  Filled 2019-04-06: qty 8
  Filled 2019-04-06: qty 3

## 2019-04-06 MED ORDER — METOPROLOL TARTRATE 5 MG/5ML IV SOLN
2.5000 mg | Freq: Once | INTRAVENOUS | Status: AC
  Administered 2019-04-06: 2.5 mg via INTRAVENOUS
  Filled 2019-04-06: qty 5

## 2019-04-06 MED ORDER — IBUPROFEN 400 MG PO TABS
600.0000 mg | ORAL_TABLET | Freq: Four times a day (QID) | ORAL | Status: DC | PRN
  Administered 2019-04-06: 600 mg

## 2019-04-06 NOTE — Progress Notes (Signed)
Updated pts sister-in-law Judy Martinique via telephone regarding pt condition and current plan of care all questions were answered.  Will continue to monitor and assess pt.  Marda Stalker, [place] Pager [phone] (please enter 7 digits) PCCM Consult Pager [phone] (please enter 7 digits)

## 2019-04-06 NOTE — Consult Note (Signed)
Pharmacy Antibiotic Note  Sonia Farrell is a 68 y.o. female admitted on 03/27/2019 with aspiration pneumonia. CXR significant for severe multilobar pneumonia in right lung. Patient was extubated 9/16 and vomited. Increased work of breathing on non-rebreather mask and was subsequently re-intubated. Pharmacy has been consulted for vancomycin dosing.   Patient has completed 5 days of therapy with Unasyn for aspiration PNA. Sputum cultures from 9/14 growing MRSA. Unasyn discontinued and vancomycin started to cover identified pathogen. WBC trending up and fever this morning. 9/17 CXR noted stable multifocal pneumonia.   Plan:  Continue vancomycin 750 mg IV q12h  Goal AUC 400-550. Expected AUC: 449.3, trough 13.5 SCr used: 0.76  Plan to order levels in 1-2 days. Will follow daily Scr per protocol. Continue to monitor labs, vitals, cultures for signs of clinical improvement with antibiotic therapy.   Height: 5\' 7"  (170.2 cm) Weight: 166 lb 0.1 oz (75.3 kg) IBW/kg (Calculated) : 61.6  Temp (24hrs), Avg:99.5 F (37.5 C), Min:98.5 F (36.9 C), Max:100.7 F (38.2 C)  Recent Labs  Lab 04/04/2019 1138 03/30/2019 1330  04/02/19 0501 04/03/19 0413 04/04/19 0534 04/05/19 0452 04/06/19 0555  WBC  --   --    < > 12.7* 15.6* 11.6* 9.5 11.8*  CREATININE  --   --    < > 0.84 0.68 0.69 0.79 0.76  LATICACIDVEN 4.0* 3.7*  --   --   --   --   --   --    < > = values in this interval not displayed.    Estimated Creatinine Clearance: 71.3 mL/min (by C-G formula based on SCr of 0.76 mg/dL).    Allergies  Allergen Reactions  . Escitalopram Oxalate Other (See Comments)    MUSCLE SPASMS    Antimicrobials this admission: 9/12 Azithromycin and Ceftriaxone x 1 9/12 Unasyn >>9/17 9/17 Vancomycin >>   Dose adjustments this admission: None  Microbiology results: 9/16 C diff: negative  9/14 sputum cx: MRSA 9/12 BCx: no growth 5 days  9/12 UCx: no growth 9/12 MRSA PCR: negative  9/12 COVID:  negative   Thank you for allowing pharmacy to be a part of this patient's care.  [place] Resident 04/06/2019 12:44 PM

## 2019-04-06 NOTE — Progress Notes (Signed)
[facility] [place] at [facility]   PATIENT NAME: Sonia Farrell    MR#:  [id number]  DATE OF BIRTH:  26-Dec-1950  SUBJECTIVE:   Remains intubated.  Had some agitation when her sedation was weaned this morning.  REVIEW OF SYSTEMS:  ROS- unable to obtain due to intubation.  DRUG ALLERGIES:   Allergies  Allergen Reactions  . Escitalopram Oxalate Other (See Comments)    MUSCLE SPASMS   VITALS:  Blood pressure (!) 160/85, pulse (!) 108, temperature (!) 101.8 F (38.8 C), temperature source Axillary, resp. rate 17, height 5\' 7"  (1.702 m), weight 75.3 kg, SpO2 96 %. PHYSICAL EXAMINATION:  Physical Exam  GENERAL:  Laying in the bed with no acute distress.  HEENT: Head atraumatic, normocephalic. Pupils equal, round, reactive to light and accommodation. No scleral icterus. Extraocular muscles intact. Oropharynx and nasopharynx clear. ETT in place NECK:  Supple, no jugular venous distention. No thyroid enlargement. LUNGS: +diminished breath sounds throughout all lung fields. No wheezes, crackles, rhonchi. No use of accessory muscles of respiration.  CARDIOVASCULAR: RRR, S1, S2 normal. No murmurs, rubs, or gallops.  ABDOMEN: Soft, nontender, nondistended. Bowel sounds present.  EXTREMITIES: No pedal edema, cyanosis, or clubbing.  NEUROLOGIC: intubated. +eyes open.  Does not follow commands this morning. PSYCHIATRIC: intubated. SKIN: No obvious rash, lesion, or ulcer.  LABORATORY PANEL:  Female CBC Recent Labs  Lab 04/06/19 0555  WBC 11.8*  HGB 10.1*  HCT 31.7*  PLT 299   ------------------------------------------------------------------------------------------------------------------ Chemistries  Recent Labs  Lab 04/01/19 0547  04/06/19 0555  NA 142   < > 146*  K 3.8   < > 4.4  CL 111   < > 111  CO2 20*   < > 26  GLUCOSE 121*   < > 216*  BUN 21   < > 23  CREATININE 0.92   < > 0.76  CALCIUM 7.7*   < > 8.7*  MG 1.7   < > 2.6*  AST 72*  --   --    ALT 36  --   --   ALKPHOS 52  --   --   BILITOT 0.7  --   --    < > = values in this interval not displayed.   RADIOLOGY:  No results found. ASSESSMENT AND PLAN:   Acute hypoxic respiratory failure due to aspiration pneumonia- in the setting of possible intentional overdose with benzos.  -Vent management per CCM -Intubated 9/12, extubated 9/16 and subsequently reintubated due to aspiration.  Bronchoscopy performed 9/16 to clear aspirate.  MRSA pneumonia and aspiration pneumonia- meeting sepsis criteria on admission with fever, tachycardia, and tachypnea. Procalcitonin elevated. Initially meeting sepsis criteria, but this has resolved.  -Sputum cultures growing MRSA -Continue vancomycin -Continue IVFs -Blood cultures with no growth to date  Altered mental status due to unclear etiology- possible benzo overdose vs seizures. AMS has resolved and patient is intermittently following commands. -Continue keppra -Neurology following -MRI brain unremarkable -Repeat MRI brain and c-spine were unremarkable except for foraminal stenosis and some disc herniatiosn -EEG with moderate to severe diffuse encephalopathy  All the records are reviewed and case discussed with Care Management/Social Worker. Management plans discussed with the patient, family and they are in agreement.  CODE STATUS: Full Code  TOTAL TIME TAKING CARE OF THIS PATIENT: 34 minutes.   More than 50% of the time was spent in counseling/coordination of care: YES  POSSIBLE D/C unknown, DEPENDING ON CLINICAL CONDITION.   Jinny BlossomKaty D [name]  M.D on 04/06/2019 at 2:34 PM  Between 7am to 6pm - Pager - [phone]  After 6pm go to [email or web address] - Proofreader  [facility]  Office  [phone]  CC: Primary care physician; Sonia Pink, MD  Note: This dictation was prepared with Dragon dictation along with smaller phrase technology. Any transcriptional errors that result from this process are  unintentional.

## 2019-04-06 NOTE — Consult Note (Signed)
PHARMACY CONSULT NOTE - FOLLOW UP  Pharmacy Consult for Electrolyte Monitoring and Replacement   Recent Labs: Potassium (mmol/L)  Date Value  04/06/2019 4.4   Magnesium (mg/dL)  Date Value  04/06/2019 2.6 (H)   Calcium (mg/dL)  Date Value  04/06/2019 8.7 (L)   Albumin (g/dL)  Date Value  04/01/2019 2.9 (L)   Phosphorus (mg/dL)  Date Value  04/06/2019 3.5   Sodium (mmol/L)  Date Value  04/06/2019 146 (H)   Corrected calcium: 9.6 mg/dL  Assessment: 68 y/o F with acute hypoxic respiratory failure due to aspiration pneumonia in the setting of possible intentional overdose with benzos and alcohol. Patient was extubated 9/16 and vomited. Increased work of breathing on non-rebreather mask and was subsequently re-intubated. Patient on vancomycin for MRSA pneumonia. Some recent but C. Diff panel is negative. Patient is on SSI.   Potassium and magnesium trending up today. Recent vancomycin initiation however creatinine is stable. Sodium trending up. Free water flushes increased to 150 mL q8h free water flushes today.  Goal of Therapy:  K ~4, Mg ~2, Phos ~2.5-4.5   Plan:  No electrolyte deficiencies today will defer supplementation  Re-check BMP tomorrow AM  [place] Resident 04/06/2019 12:49 PM

## 2019-04-06 NOTE — Progress Notes (Signed)
Pharmacy Antibiotic Note  Sonia Farrell is a 68 y.o. female admitted on 04/15/2019 with aspiration pneumonia.  Pharmacy has been consulted for Unasyn dosing.  Plan: Unasyn 3g IV q6h   Height: 5\' 7"  (170.2 cm) Weight: 166 lb 0.1 oz (75.3 kg) IBW/kg (Calculated) : 61.6  Temp (24hrs), Avg:100.4 F (38 C), Min:98.5 F (36.9 C), Max:101.8 F (38.8 C)  Recent Labs  Lab 03/29/2019 1138 04/15/2019 1330  04/02/19 0501 04/03/19 0413 04/04/19 0534 04/05/19 0452 04/06/19 0555  WBC  --   --    < > 12.7* 15.6* 11.6* 9.5 11.8*  CREATININE  --   --    < > 0.84 0.68 0.69 0.79 0.76  LATICACIDVEN 4.0* 3.7*  --   --   --   --   --   --    < > = values in this interval not displayed.    Estimated Creatinine Clearance: 71.3 mL/min (by C-G formula based on SCr of 0.76 mg/dL).    Allergies  Allergen Reactions  . Escitalopram Oxalate Other (See Comments)    MUSCLE SPASMS     Thank you for allowing pharmacy to be a part of this patient's care.  Rocky Morel 04/06/2019 8:33 PM

## 2019-04-06 NOTE — Progress Notes (Signed)
Name: Sonia Farrell MRN: [id number] DOB: [birth year]     CONSULTATION DATE: 03/23/2019  CHIEF COMPLAINT:  Acute hypoxic respiratory failure  SIGNIFICANT EVENTS/STUDIES: 9/12 admitted to ICU via ED, intubated and unresponsive 9/12CXR suspicious for right lobe CAP, Lactic acid elevated 9/12Possible seizure noted, CT head showed no intracranial abnormalities 9/13MRI brain showed no abnormalities 9/15Remains minimally responsive and intubated without sedation 9/15EEG showed severe encephalopathy with no seizure activity 9/16Significantly more responsive, following commands 9/16Extubated in ICU. Immediate copious emesis and aspiration occurred 9/16Re-intubated and bronchoscopy performed to clear aspirate +MRSA pneumonia 9/18 Intubated and sedated - upon wakeup assessment pt became agitated and asynchronous  HISTORY OF PRESENT ILLNESS:  Sonia Farrell is a 68 yo female with history of depression, hyperlipidemia, and hypothyroidism presented to the ED unresponsive in the setting of a possible drug overdose. Pt was intubated and sedated. CXR suspicious for RL aspiration pneumonia and respiratory culture positive for MRSA. Pt was extubated and re-intubated 9/16 with new CXR revealing multilobar opacities suspicious for aspiration pneumonia.  Today pt remains intubated and failed wakeup assessment because she became agitated and asynchronous.  PAST MEDICAL HISTORY :   has a past medical history of Arthritis, GERD (gastroesophageal reflux disease), Hyperlipidemia, and Hypothyroidism.  has a past surgical history that includes Colonoscopy; Cataract extraction w/PHACO (Left, 12/26/2018); and Cataract extraction w/PHACO (Right, 01/23/2019). Prior to Admission medications   Medication Sig Start Date End Date Taking? Authorizing Provider  ALPRAZolam Duanne Moron) 0.5 MG tablet Take 0.5 mg by mouth as needed for anxiety.   Yes [provider]  levothyroxine (SYNTHROID) 75 MCG tablet Take  75 mcg by mouth daily before breakfast.   Yes [provider]  pantoprazole (PROTONIX) 40 MG tablet Take 40 mg by mouth daily as needed.   Yes [provider]  PARoxetine (PAXIL) 10 MG tablet Take 10 mg by mouth daily.   Yes [provider]  rosuvastatin (CRESTOR) 10 MG tablet Take 10 mg by mouth daily.   Yes [provider]  acetaminophen (TYLENOL) 325 MG tablet Take 650 mg by mouth every 6 (six) hours as needed.    [provider]   Allergies  Allergen Reactions  . Escitalopram Oxalate Other (See Comments)    MUSCLE SPASMS    REVIEW OF SYSTEMS:   Unable to obtain due to critical illness  VITAL SIGNS: Temp:  [98.5 F (36.9 C)-100.1 F (37.8 C)] 99.8 F (37.7 C) (09/18 0800) Pulse Rate:  [83-127] 108 (09/18 0806) Resp:  [14-24] 15 (09/18 0806) BP: (121-163)/(60-116) 162/89 (09/18 0800) SpO2:  [93 %-97 %] 96 % (09/18 0806) FiO2 (%):  [35 %-40 %] 35 % (09/18 0808) Weight:  [75.3 kg] 75.3 kg (09/18 0153)   I/O last 3 completed shifts: In: 4378.5 [I.V.:1028.9; NG/GT:2299.5; IV Piggyback:1050.1] Out: 1575 [Urine:1425; Stool:150] Total I/O In: 84.6 [I.V.:84.6] Out: 35 [Urine:35]   SpO2: 96 % O2 Flow Rate (L/min): 15 L/min FiO2 (%): 35 %   Physical Examination:  GENERAL:critically ill appearing, +resp distress HEAD: Normocephalic, atraumatic.  EYES: Pupils equal, round, reactive to light.  No scleral icterus.  MOUTH: Moist mucosal membrane. NECK: Supple. No JVD.  PULMONARY: lungs clear to auscultation CARDIOVASCULAR: S1 and S2. Regular rate and rhythm. No murmurs, rubs, or gallops.  GASTROINTESTINAL: Soft, nontender, -distended. No masses. Positive bowel sounds. No hepatosplenomegaly.  MUSCULOSKELETAL: No swelling, clubbing, or edema.  NEUROLOGIC: obtunded, RASS -4 on sedation SKIN: intact,warm,dry  I personally reviewed lab work that was obtained in last 24 hrs. CXR Independently  reviewed  MEDICATIONS: I have reviewed all  medications and confirmed regimen as documented   CULTURE RESULTS   Recent Results (from the past 240 hour(s))  Urine culture     Status: None   Collection Time: 03/24/2019 11:12 AM   Specimen: Urine, Random  Result Value Ref Range Status   Specimen Description   Final    URINE, RANDOM Performed at [facility], [address] [place], [place] [address]    Special Requests   Final    NONE Performed at [facility], [address] [place], [place] [address]    Culture   Final    NO GROWTH Performed at [facility], [place] [address] [place], [place] [address]    Report Status 04/02/2019 FINAL  Final  Blood Cultures (routine x 2)     Status: None   Collection Time: 03/25/2019 11:38 AM   Specimen: BLOOD  Result Value Ref Range Status   Specimen Description BLOOD BLOOD RIGHT HAND  Final   Special Requests   Final    BOTTLES DRAWN AEROBIC AND ANAEROBIC Blood Culture results may not be optimal due to an excessive volume of blood received in culture bottles   Culture   Final    NO GROWTH 5 DAYS Performed at [facility], [address] [place], [place] [address]    Report Status 04/05/2019 FINAL  Final  SARS Coronavirus 2 [facility] order, Performed in [place] hospital lab) Nasopharyngeal     Status: None   Collection Time: 04/07/2019 11:38 AM   Specimen: Nasopharyngeal  Result Value Ref Range Status   SARS Coronavirus 2 NEGATIVE NEGATIVE Final    Comment: (NOTE) If result is NEGATIVE SARS-CoV-2 target nucleic acids are NOT DETECTED. The SARS-CoV-2 RNA is generally detectable in upper and lower  respiratory specimens during the acute phase of infection. The lowest  concentration of SARS-CoV-2 viral copies this assay can detect is 250  copies / mL. A negative result does not preclude SARS-CoV-2 infection  and should not be used as the sole basis for treatment or other  patient management decisions.  A negative result may occur with  improper  specimen collection / handling, submission of specimen other  than nasopharyngeal swab, presence of viral mutation(s) within the  areas targeted by this assay, and inadequate number of viral copies  (<250 copies / mL). A negative result must be combined with clinical  observations, patient history, and epidemiological information. If result is POSITIVE SARS-CoV-2 target nucleic acids are DETECTED. The SARS-CoV-2 RNA is generally detectable in upper and lower  respiratory specimens dur ing the acute phase of infection.  Positive  results are indicative of active infection with SARS-CoV-2.  Clinical  correlation with patient history and other diagnostic information is  necessary to determine patient infection status.  Positive results do  not rule out bacterial infection or co-infection with other viruses. If result is PRESUMPTIVE POSTIVE SARS-CoV-2 nucleic acids MAY BE PRESENT.   A presumptive positive result was obtained on the submitted specimen  and confirmed on repeat testing.  While 2019 novel coronavirus  (SARS-CoV-2) nucleic acids may be present in the submitted sample  additional confirmatory testing may be necessary for epidemiological  and / or clinical management purposes  to differentiate between  SARS-CoV-2 and other Sarbecovirus currently known to infect humans.  If clinically indicated additional testing with an alternate test  methodology [phone]) is advised. The SARS-CoV-2 RNA is generally  detectable in upper and  lower respiratory sp ecimens during the acute  phase of infection. The expected result is Negative. Fact Sheet for Patients:  StrictlyIdeas.no Fact Sheet for Healthcare Providers: BankingDealers.co.za This test is not yet approved or cleared by the [place] FDA and has been authorized for detection and/or diagnosis of SARS-CoV-2 by FDA under an Emergency Use Authorization (EUA).  This EUA will remain in  effect (meaning this test can be used) for the duration of the COVID-19 declaration under Section 564(b)(1) of the Act, 21 U.S.C. section 360bbb-3(b)(1), unless the authorization is terminated or revoked sooner. Performed at [facility], [address] [place], [place] [address]   Blood Cultures (routine x 2)     Status: None   Collection Time: 04/02/2019 11:39 AM   Specimen: BLOOD  Result Value Ref Range Status   Specimen Description BLOOD BLOOD RIGHT FOREARM  Final   Special Requests   Final    BOTTLES DRAWN AEROBIC AND ANAEROBIC Blood Culture adequate volume   Culture   Final    NO GROWTH 5 DAYS Performed at [facility], [address] [place], [place] [address]    Report Status 04/05/2019 FINAL  Final  MRSA PCR Screening     Status: None   Collection Time: 04/15/2019  5:46 PM   Specimen: Nasopharyngeal  Result Value Ref Range Status   MRSA by PCR NEGATIVE NEGATIVE Final    Comment:        The GeneXpert MRSA Assay (FDA approved for NASAL specimens only), is one component of a comprehensive MRSA colonization surveillance program. It is not intended to diagnose MRSA infection nor to guide or monitor treatment for MRSA infections. Performed at [facility], [address] [place], [place] [address]   Culture, respiratory     Status: None   Collection Time: 04/02/19  4:50 PM   Specimen: SPU  Result Value Ref Range Status   Specimen Description   Final    SPUTUM Performed at [facility], [address], [place], [place] [address]    Special Requests   Final    NONE Performed at [facility], [address] [place], [place] [address]    Gram Stain   Final    ABUNDANT WBC PRESENT, PREDOMINANTLY PMN NO SQUAMOUS EPITHELIAL CELLS SEEN FEW GRAM POSITIVE COCCI IN CLUSTERS Performed at [facility], [place] [address], [place], [place] [address]    Culture FEW METHICILLIN RESISTANT STAPHYLOCOCCUS AUREUS  Final    Report Status 04/05/2019 FINAL  Final   Organism ID, Bacteria METHICILLIN RESISTANT STAPHYLOCOCCUS AUREUS  Final      Susceptibility   Methicillin resistant staphylococcus aureus - MIC*    CIPROFLOXACIN >=8 RESISTANT Resistant     ERYTHROMYCIN >=8 RESISTANT Resistant     GENTAMICIN <=0.5 SENSITIVE Sensitive     OXACILLIN >=4 RESISTANT Resistant     TETRACYCLINE <=1 SENSITIVE Sensitive     VANCOMYCIN 1 SENSITIVE Sensitive     TRIMETH/SULFA >=320 RESISTANT Resistant     CLINDAMYCIN <=0.25 SENSITIVE Sensitive     RIFAMPIN <=0.5 SENSITIVE Sensitive     Inducible Clindamycin NEGATIVE Sensitive     * FEW METHICILLIN RESISTANT STAPHYLOCOCCUS AUREUS  C difficile quick scan w PCR reflex     Status: None   Collection Time: 04/04/19 12:54 PM   Specimen: STOOL  Result Value Ref Range Status   C Diff antigen NEGATIVE NEGATIVE Final   C Diff toxin NEGATIVE NEGATIVE Final   C Diff interpretation  No C. difficile detected.  Final    Comment: Performed at [facility], [address] [place], [place] [address]          IMAGING    No results found.      Indwelling Urinary Catheter continued, requirement due to   Reason to continue Indwelling Urinary Catheter strict Intake/Output monitoring for hemodynamic instability         Ventilator continued, requirement due to severe respiratory failure   Ventilator Sedation RASS 0 to -2      ASSESSMENT AND PLAN SYNOPSIS 68 yo F admitted to ICU for acute hypoxic respiratory failure from acute right lobe aspiration pneumonia s/t possible ETOH abuse and possible intentional drug OD from benzodiazepines causing acute metabolic encephalopathy.Post re-intubation CXR shows multilobar aspiration pneumonia. +MRSA pneumonia  Severe ACUTE Hypoxic and Hypercapnic Respiratory Failure -continue Full MV support -continue Bronchodilator Therapy -Wean Fio2 and PEEP as tolerated -will perform SAT/SBT when respiratory parameters are met   INFECTIOUS DISEASE-ASPIRATION pneumonia multilobar +MRSA pneumonia - continue ABX as prescribed - follow up cultures - follow up chest xray  NEUROLOGY - intubated and sedated - minimal sedation to achieve a RASS goal: -1 Wake up assessment pending  CARDIAC ICU monitoring  GI GI PROPHYLAXIS as indicated  NUTRITIONAL STATUS DIET-->TF's as tolerated Constipation protocol as indicated  ENDO - will use ICU hypoglycemic\Hyperglycemia protocol if needed  ELECTROLYTES -follow labs as needed -replace as needed -pharmacy consultation and following  DVT/GI PRX ordered TRANSFUSIONS AS NEEDED MONITOR FSBS ASSESS the need for LABS    Critical Care Time devoted to patient care services described in this note is 40 minutes.   Overall, patient is critically ill, prognosis is guarded.  Patient with Multiorgan failure and at high risk for cardiac arrest and death.    Nira Conn, PA-S

## 2019-04-07 ENCOUNTER — Other Ambulatory Visit: Payer: Self-pay

## 2019-04-07 ENCOUNTER — Inpatient Hospital Stay: Payer: Medicare Other

## 2019-04-07 DIAGNOSIS — T50902D Poisoning by unspecified drugs, medicaments and biological substances, intentional self-harm, subsequent encounter: Secondary | ICD-10-CM

## 2019-04-07 LAB — CBC WITH DIFFERENTIAL/PLATELET
Abs Immature Granulocytes: 0.8 10*3/uL — ABNORMAL HIGH (ref 0.00–0.07)
Basophils Absolute: 0 10*3/uL (ref 0.0–0.1)
Basophils Relative: 0 %
Eosinophils Absolute: 0 10*3/uL (ref 0.0–0.5)
Eosinophils Relative: 0 %
HCT: 32.1 % — ABNORMAL LOW (ref 36.0–46.0)
Hemoglobin: 10 g/dL — ABNORMAL LOW (ref 12.0–15.0)
Immature Granulocytes: 6 %
Lymphocytes Relative: 7 %
Lymphs Abs: 1 10*3/uL (ref 0.7–4.0)
MCH: 30.7 pg (ref 26.0–34.0)
MCHC: 31.2 g/dL (ref 30.0–36.0)
MCV: 98.5 fL (ref 80.0–100.0)
Monocytes Absolute: 0.6 10*3/uL (ref 0.1–1.0)
Monocytes Relative: 4 %
Neutro Abs: 12 10*3/uL — ABNORMAL HIGH (ref 1.7–7.7)
Neutrophils Relative %: 83 %
Platelets: 327 10*3/uL (ref 150–400)
RBC: 3.26 MIL/uL — ABNORMAL LOW (ref 3.87–5.11)
RDW: 13.8 % (ref 11.5–15.5)
WBC: 14.5 10*3/uL — ABNORMAL HIGH (ref 4.0–10.5)
nRBC: 0.1 % (ref 0.0–0.2)

## 2019-04-07 LAB — BASIC METABOLIC PANEL
Anion gap: 6 (ref 5–15)
BUN: 28 mg/dL — ABNORMAL HIGH (ref 8–23)
CO2: 30 mmol/L (ref 22–32)
Calcium: 8.7 mg/dL — ABNORMAL LOW (ref 8.9–10.3)
Chloride: 109 mmol/L (ref 98–111)
Creatinine, Ser: 0.7 mg/dL (ref 0.44–1.00)
GFR calc Af Amer: >60 mL/min (ref >60)
GFR calc non Af Amer: >60 mL/min (ref >60)
Glucose, Bld: 182 mg/dL — ABNORMAL HIGH (ref 70–99)
Potassium: 4.6 mmol/L (ref 3.5–5.1)
Sodium: 145 mmol/L (ref 135–145)

## 2019-04-07 LAB — GLUCOSE, CAPILLARY
Glucose-Capillary: 159 mg/dL — ABNORMAL HIGH (ref 70–99)
Glucose-Capillary: 160 mg/dL — ABNORMAL HIGH (ref 70–99)
Glucose-Capillary: 160 mg/dL — ABNORMAL HIGH (ref 70–99)
Glucose-Capillary: 165 mg/dL — ABNORMAL HIGH (ref 70–99)
Glucose-Capillary: 173 mg/dL — ABNORMAL HIGH (ref 70–99)
Glucose-Capillary: 174 mg/dL — ABNORMAL HIGH (ref 70–99)

## 2019-04-07 LAB — TRIGLYCERIDES: Triglycerides: 145 mg/dL (ref <150)

## 2019-04-07 LAB — PROCALCITONIN: Procalcitonin: 1.26 ng/mL

## 2019-04-07 IMAGING — DX DG CHEST 1V PORT
1 series · 1 of 1 positions shown · non-contrast
Comparison: [DATE] and earlier exams.

CLINICAL DATA: Acute respiratory distress.  Follow-up exam.

EXAM:
PORTABLE CHEST 1 VIEW

[chest ap]
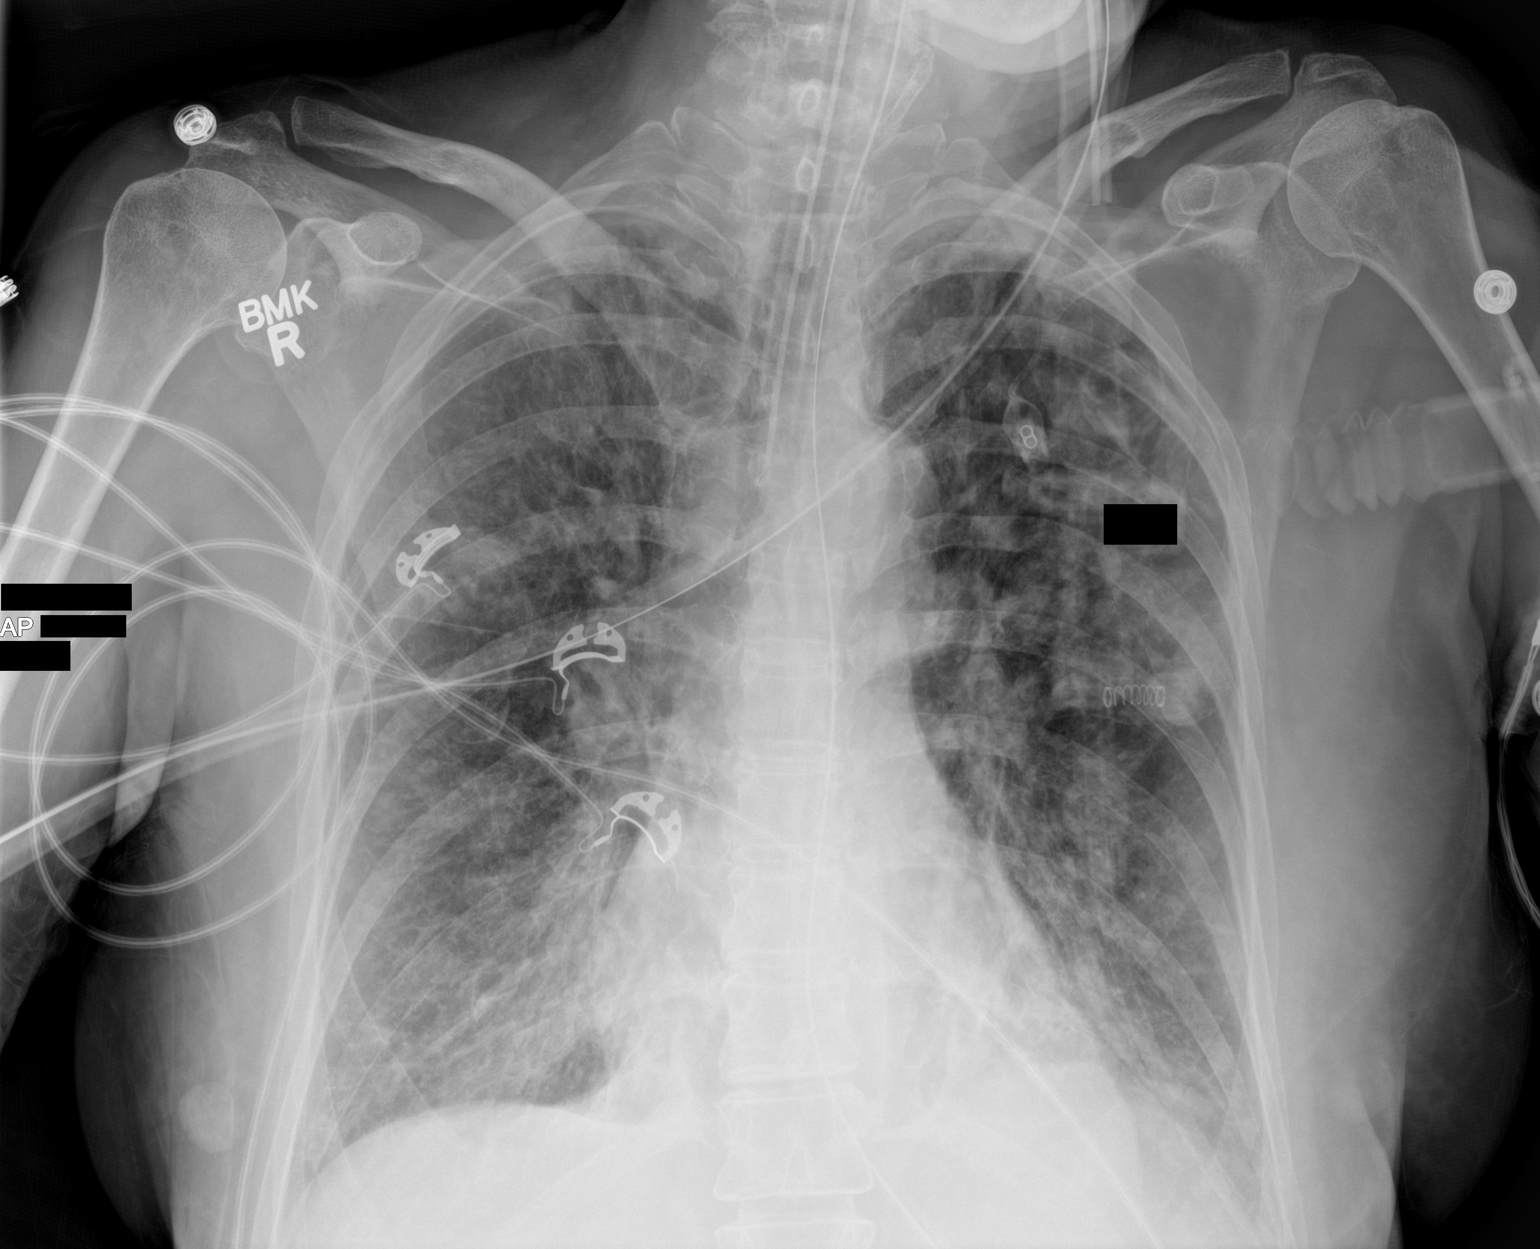

[1 of 1 positions shown; findings below may reference images not displayed]

FINDINGS: Right upper lobe airspace consolidation is significantly improved,
particularly compared to [DATE]. Lungs show bilateral
interstitial prominence with patchy area airspace opacities more
diffusely, the latter also improved the previous exam.

No new lung abnormalities. No convincing pleural effusion and no
pneumothorax.

Endotracheal tube and nasal/orogastric tube are stable and well
positioned.
IMPRESSION: 1. Improved lung aeration compared to most recent prior exams. This
is most evident in right upper lobe with decreased airspace lung
opacity.
2. No new abnormalities.  Stable support apparatus.

## 2019-04-07 MED ORDER — PROPOFOL 1000 MG/100ML IV EMUL
5.0000 ug/kg/min | INTRAVENOUS | Status: DC
  Administered 2019-04-07: 22:00:00 10 ug/kg/min via INTRAVENOUS
  Administered 2019-04-08: 05:00:00 25 ug/kg/min via INTRAVENOUS
  Administered 2019-04-08: 09:00:00 45 ug/kg/min via INTRAVENOUS
  Administered 2019-04-08 (×3): 50 ug/kg/min via INTRAVENOUS
  Administered 2019-04-09: 60 ug/kg/min via INTRAVENOUS
  Administered 2019-04-09 (×3): 50 ug/kg/min via INTRAVENOUS
  Administered 2019-04-09: 60 ug/kg/min via INTRAVENOUS
  Administered 2019-04-09: 06:00:00 50 ug/kg/min via INTRAVENOUS
  Administered 2019-04-10: 17:00:00 70 ug/kg/min via INTRAVENOUS
  Administered 2019-04-10: 01:00:00 60 ug/kg/min via INTRAVENOUS
  Administered 2019-04-10 (×2): 70 ug/kg/min via INTRAVENOUS
  Administered 2019-04-10: 65 ug/kg/min via INTRAVENOUS
  Administered 2019-04-10 (×2): 70 ug/kg/min via INTRAVENOUS
  Filled 2019-04-07 (×19): qty 100

## 2019-04-07 MED ORDER — CHLORHEXIDINE GLUCONATE CLOTH 2 % EX PADS
6.0000 | MEDICATED_PAD | Freq: Every day | CUTANEOUS | Status: DC
  Administered 2019-04-07 – 2019-04-10 (×3): 6 via TOPICAL

## 2019-04-07 MED ORDER — MIDAZOLAM HCL 2 MG/2ML IJ SOLN
2.0000 mg | Freq: Once | INTRAMUSCULAR | Status: AC
  Administered 2019-04-07: 2 mg via INTRAVENOUS

## 2019-04-07 MED ORDER — DEXMEDETOMIDINE HCL IN NACL 400 MCG/100ML IV SOLN: 0.4000 ug/kg/h | INTRAVENOUS | Status: AC

## 2019-04-07 NOTE — Progress Notes (Addendum)
[employer] [facility] -  at [facility]   PATIENT NAME: Sonia Farrell    MR#:  [id number]  DATE OF BIRTH:  [birth year]  SUBJECTIVE:   Remains intubated this morning. Opens eyes but not consistently following commands.  REVIEW OF SYSTEMS:  ROS- unable to obtain due to intubation.  DRUG ALLERGIES:   Allergies  Allergen Reactions  . Escitalopram Oxalate Other (See Comments)    MUSCLE SPASMS   VITALS:  Blood pressure (!) 146/72, pulse 85, temperature 99 F (37.2 C), temperature source Oral, resp. rate 19, height 5\' 7"  (1.702 m), weight 75.3 kg, SpO2 93 %. PHYSICAL EXAMINATION:  Physical Exam  GENERAL:  Laying in the bed with no acute distress.  HEENT: Head atraumatic, normocephalic. Pupils equal, round, reactive to light and accommodation. No scleral icterus. Extraocular muscles intact. Oropharynx and nasopharynx clear. ETT in place NECK:  Supple, no jugular venous distention. No thyroid enlargement. LUNGS: +diminished breath sounds throughout all lung fields. No wheezes, crackles, rhonchi. No use of accessory muscles of respiration.  CARDIOVASCULAR: RRR, S1, S2 normal. No murmurs, rubs, or gallops.  ABDOMEN: Soft, nontender, nondistended. Bowel sounds present.  EXTREMITIES: No pedal edema, cyanosis, or clubbing.  NEUROLOGIC: intubated. +eyes open.  Does not follow commands. PSYCHIATRIC: intubated. SKIN: No obvious rash, lesion, or ulcer.  LABORATORY PANEL:  Female CBC Recent Labs  Lab 04/07/19 0447  WBC 14.5*  HGB 10.0*  HCT 32.1*  PLT 327   ------------------------------------------------------------------------------------------------------------------ Chemistries  Recent Labs  Lab 04/01/19 0547  04/06/19 0555 04/07/19 0447  NA 142   < > 146* 145  K 3.8   < > 4.4 4.6  CL 111   < > 111 109  CO2 20*   < > 26 30  GLUCOSE 121*   < > 216* 182*  BUN 21   < > 23 28*  CREATININE 0.92   < > 0.76 0.70  CALCIUM 7.7*   < > 8.7* 8.7*  MG 1.7   < > 2.6*   --   AST 72*  --   --   --   ALT 36  --   --   --   ALKPHOS 52  --   --   --   BILITOT 0.7  --   --   --    < > = values in this interval not displayed.   RADIOLOGY:  Dg Chest Port 1 View  Result Date: 04/07/2019 CLINICAL DATA:  Acute respiratory distress.  Follow-up exam. EXAM: PORTABLE CHEST 1 VIEW COMPARISON:  04/05/2019 and earlier exams. FINDINGS: Right upper lobe airspace consolidation is significantly improved, particularly compared to 04/04/2019. Lungs show bilateral interstitial prominence with patchy area airspace opacities more diffusely, the latter also improved the previous exam. No new lung abnormalities. No convincing pleural effusion and no pneumothorax. Endotracheal tube and nasal/orogastric tube are stable and well positioned. IMPRESSION: 1. Improved lung aeration compared to most recent prior exams. This is most evident in right upper lobe with decreased airspace lung opacity. 2. No new abnormalities.  Stable support apparatus. Electronically Signed   By: Amie Portlandavid  Ormond M.D.   On: 04/07/2019 05:58   ASSESSMENT AND PLAN:   Acute hypoxic respiratory failure due to aspiration pneumonia- initially due to possible intentional overdose with benzos.  -Vent management per CCM -Intubated 9/12, extubated 9/16 and subsequently reintubated due to aspiration.  Bronchoscopy performed 9/16 to clear aspirate.  MRSA pneumonia and aspiration pneumonia- meeting sepsis criteria on admission with fever, tachycardia, and tachypnea.  Procalcitonin elevated. Initially meeting sepsis criteria, but this has resolved.  -CXR with improved aeration this morning -Sputum cultures growing MRSA -Continue vancomycin -Continue IVFs -Blood cultures with no growth to date  Altered mental status due to unclear etiology- possible benzo overdose vs seizures. AMS has resolved and patient is intermittently following commands. -Now off keppra -Neurology following -MRI brain unremarkable -Repeat MRI brain and  c-spine were unremarkable except for foraminal stenosis and some disc herniatiosn -EEG with moderate to severe diffuse encephalopathy  All the records are reviewed and case discussed with Care Management/Social Worker. Management plans discussed with the patient, family and they are in agreement.  CODE STATUS: Full Code  TOTAL TIME TAKING CARE OF THIS PATIENT: 34 minutes.   More than 50% of the time was spent in counseling/coordination of care: YES  POSSIBLE D/C unknown, DEPENDING ON CLINICAL CONDITION.   Berna Spare Mayo M.D on 04/07/2019 at 1:11 PM  Between 7am to 6pm - Pager - [phone]  After 6pm go to [email or web address] - Proofreader  [facility]  Office  [phone]  CC: Primary care physician; [place] Pink, MD  Note: This dictation was prepared with Dragon dictation along with smaller phrase technology. Any transcriptional errors that result from this process are unintentional.

## 2019-04-07 NOTE — Consult Note (Signed)
PHARMACY CONSULT NOTE - FOLLOW UP  Pharmacy Consult for Electrolyte Monitoring and Replacement   Recent Labs: Potassium (mmol/L)  Date Value  04/07/2019 4.6   Magnesium (mg/dL)  Date Value  04/06/2019 2.6 (H)   Calcium (mg/dL)  Date Value  04/07/2019 8.7 (L)   Albumin (g/dL)  Date Value  04/01/2019 2.9 (L)   Phosphorus (mg/dL)  Date Value  04/06/2019 3.5   Sodium (mmol/L)  Date Value  04/07/2019 145   Corrected calcium: 9.6 mg/dL  Assessment: 68 y/o F with acute hypoxic respiratory failure due to aspiration pneumonia in the setting of possible intentional overdose with benzos and alcohol. Patient was extubated 9/16 and vomited. Increased work of breathing on non-rebreather mask and was subsequently re-intubated. Patient on vancomycin for MRSA pneumonia. Some recent but C. Diff panel is negative. Patient is on SSI.   Potassium continues to trend up today. Recent vancomycin initiation however creatinine is stable. Sodium improved after increasing free water flushes to 150 mL q8h yesterday.   Goal of Therapy:  K ~4, Mg ~2, Phos ~2.5-4.5   Plan:  No electrolyte deficiencies today will defer supplementation  Re-check BMP tomorrow AM  [place] Resident 04/07/2019 8:35 AM

## 2019-04-07 NOTE — Progress Notes (Signed)
Follow up - Critical Care Medicine Note  Patient Details:    Sonia Farrell is an 68 y.o. female  never smoker intubated in ED for decreased LOC and acute hypoxemic respiratory failure.  Patient overdosed intentionally.  CXR c/w PNA.  She was extubated (9/16) but vomited and aspirated and required reintubation.  Sputum consistent with MRSA.  Lines, Airways, Drains: Airway 8 mm (Active)  Secured at (cm) 24 cm 04/07/19 1615  Measured From Lips 04/07/19 1615  Secured Location Right 04/07/19 1517  Secured By [employer] Tube Holder 04/07/19 1517  Tube Holder Repositioned Yes 04/07/19 1517  Cuff Pressure (cm H2O) 25 cm H2O 04/07/19 0817  Site Condition Cool;Dry 04/07/19 1517     NG/OG Tube Orogastric 18 Fr. Documented cm marking at nare/ corner of mouth 68 cm (Active)  Cm Marking at Nare/Corner of Mouth (if applicable) 68 cm 04/07/19 1615  Site Assessment Clean;Dry;Intact 04/07/19 1615  Ongoing Placement Verification No change in cm markings or external length of tube from initial placement;No change in respiratory status;No acute changes, not attributed to clinical condition 04/07/19 1615  Status Infusing tube feed 04/07/19 1615  Drainage Appearance Bile;Bloody;Brown 03/20/2019 1725  Intake (mL) 90 mL 04/07/19 1000  Output (mL) 0 mL 04/06/19 2325     Rectal Tube/Pouch (Active)  Output (mL) 400 mL 04/07/19 0345  Intake (mL) 0 mL 04/06/19 2000     Urethral Catheter Kara Knight 14 Fr. (Active)  Indication for Insertion or Continuance of Catheter Therapy based on hourly urine output monitoring and documentation for critical condition (NOT STRICT I&O) 04/07/19 0800  Site Assessment Clean;Intact 04/07/19 0800  Catheter Maintenance Bag below level of bladder;Insertion date on drainage bag;No dependent loops;Catheter secured;Drainage bag/tubing not touching floor;Seal intact;Bag emptied prior to transport 04/07/19 0800  Collection Container Standard drainage bag 04/07/19 0800  Securement Method  Securing device (Describe) 04/07/19 0800  Urinary Catheter Interventions (if applicable) Unclamped 04/07/19 0345  Input (mL) 0 mL 04/06/19 2000  Output (mL) 50 mL 04/07/19 1700    Anti-infectives:  Anti-infectives (From admission, onward)   Start     Dose/Rate Route Frequency Ordered Stop   04/06/19 1800  Ampicillin-Sulbactam (UNASYN) 3 g in sodium chloride 0.9 % 100 mL IVPB     3 g 200 mL/hr over 30 Minutes Intravenous Every 6 hours 04/06/19 1727     04/06/19 0000  vancomycin (VANCOCIN) IVPB 750 mg/150 ml premix     750 mg 150 mL/hr over 60 Minutes Intravenous Every 12 hours 04/05/19 1423     04/05/19 1100  vancomycin (VANCOCIN) 1,500 mg in sodium chloride 0.9 % 500 mL IVPB     1,500 mg 250 mL/hr over 120 Minutes Intravenous  Once 04/05/19 1056 04/05/19 1339   04/09/2019 2200  Ampicillin-Sulbactam (UNASYN) 3 g in sodium chloride 0.9 % 100 mL IVPB  Status:  Discontinued     3 g 200 mL/hr over 30 Minutes Intravenous Every 6 hours 04/15/2019 1915 04/05/19 1056   04/18/2019 1230  cefTRIAXone (ROCEPHIN) 2 g in sodium chloride 0.9 % 100 mL IVPB  Status:  Discontinued     2 g 200 mL/hr over 30 Minutes Intravenous Every 24 hours 03/30/2019 1224 04/13/2019 1915   04/10/2019 1230  azithromycin (ZITHROMAX) 500 mg in sodium chloride 0.9 % 250 mL IVPB  Status:  Discontinued     500 mg 250 mL/hr over 60 Minutes Intravenous Every 24 hours 04/07/2019 1224 03/27/2019 1915      Microbiology: Results for orders placed or performed during  the hospital encounter of 03/27/2019  Urine culture     Status: None   Collection Time: 03/26/2019 11:12 AM   Specimen: Urine, Random  Result Value Ref Range Status   Specimen Description   Final    URINE, RANDOM Performed at [facility], [address] [place], [place] [address]    Special Requests   Final    NONE Performed at [facility], [address], [place], [place] [address]    Culture   Final    NO GROWTH Performed at [facility],  [place] [address], [place], [place] [address]    Report Status 04/02/2019 FINAL  Final  Blood Cultures (routine x 2)     Status: None   Collection Time: 04/06/2019 11:38 AM   Specimen: BLOOD  Result Value Ref Range Status   Specimen Description BLOOD BLOOD RIGHT HAND  Final   Special Requests   Final    BOTTLES DRAWN AEROBIC AND ANAEROBIC Blood Culture results may not be optimal due to an excessive volume of blood received in culture bottles   Culture   Final    NO GROWTH 5 DAYS Performed at [facility], [address] [place], [place] [address]    Report Status 04/05/2019 FINAL  Final  SARS Coronavirus 2 [facility] order, Performed in [place] hospital lab) Nasopharyngeal     Status: None   Collection Time: 04/17/2019 11:38 AM   Specimen: Nasopharyngeal  Result Value Ref Range Status   SARS Coronavirus 2 NEGATIVE NEGATIVE Final    Comment: (NOTE) If result is NEGATIVE SARS-CoV-2 target nucleic acids are NOT DETECTED. The SARS-CoV-2 RNA is generally detectable in upper and lower  respiratory specimens during the acute phase of infection. The lowest  concentration of SARS-CoV-2 viral copies this assay can detect is 250  copies / mL. A negative result does not preclude SARS-CoV-2 infection  and should not be used as the sole basis for treatment or other  patient management decisions.  A negative result may occur with  improper specimen collection / handling, submission of specimen other  than nasopharyngeal swab, presence of viral mutation(s) within the  areas targeted by this assay, and inadequate number of viral copies  (<250 copies / mL). A negative result must be combined with clinical  observations, patient history, and epidemiological information. If result is POSITIVE SARS-CoV-2 target nucleic acids are DETECTED. The SARS-CoV-2 RNA is generally detectable in upper and lower  respiratory specimens dur ing the acute phase of infection.  Positive  results are indicative of  active infection with SARS-CoV-2.  Clinical  correlation with patient history and other diagnostic information is  necessary to determine patient infection status.  Positive results do  not rule out bacterial infection or co-infection with other viruses. If result is PRESUMPTIVE POSTIVE SARS-CoV-2 nucleic acids MAY BE PRESENT.   A presumptive positive result was obtained on the submitted specimen  and confirmed on repeat testing.  While 2019 novel coronavirus  (SARS-CoV-2) nucleic acids may be present in the submitted sample  additional confirmatory testing may be necessary for epidemiological  and / or clinical management purposes  to differentiate between  SARS-CoV-2 and other Sarbecovirus currently known to infect humans.  If clinically indicated additional testing with an alternate test  methodology [phone]) is advised. The SARS-CoV-2 RNA is generally  detectable in upper and lower respiratory sp ecimens during the acute  phase of infection. The expected result is Negative. Fact Sheet for Patients:  BoilerBrush.com.cy Fact Sheet for Healthcare Providers: https://pope.com/ This test is not yet approved or cleared by the [place] FDA and has been authorized for detection and/or diagnosis of SARS-CoV-2 by FDA under an Emergency Use Authorization (EUA).  This EUA will remain in effect (meaning this test can be used) for the duration of the COVID-19 declaration under Section 564(b)(1) of the Act, 21 U.S.C. section 360bbb-3(b)(1), unless the authorization is terminated or revoked sooner. Performed at [facility], [address] [place], [place] [address]   Blood Cultures (routine x 2)     Status: None   Collection Time: 04/24/2019 11:39 AM   Specimen: BLOOD  Result Value Ref Range Status   Specimen Description BLOOD BLOOD RIGHT FOREARM  Final   Special Requests   Final    BOTTLES DRAWN AEROBIC AND ANAEROBIC Blood  Culture adequate volume   Culture   Final    NO GROWTH 5 DAYS Performed at [facility], [address] [place], [place] [address]    Report Status 04/05/2019 FINAL  Final  MRSA PCR Screening     Status: None   Collection Time: 04/24/2019  5:46 PM   Specimen: Nasopharyngeal  Result Value Ref Range Status   MRSA by PCR NEGATIVE NEGATIVE Final    Comment:        The GeneXpert MRSA Assay (FDA approved for NASAL specimens only), is one component of a comprehensive MRSA colonization surveillance program. It is not intended to diagnose MRSA infection nor to guide or monitor treatment for MRSA infections. Performed at [facility], [address] [place], [place] [address]   Culture, respiratory     Status: None   Collection Time: 04/02/19  4:50 PM   Specimen: SPU  Result Value Ref Range Status   Specimen Description   Final    SPUTUM Performed at [facility], [address], [place], [place] [address]    Special Requests   Final    NONE Performed at [facility], [address] [place], [place] [address]    Gram Stain   Final    ABUNDANT WBC PRESENT, PREDOMINANTLY PMN NO SQUAMOUS EPITHELIAL CELLS SEEN FEW GRAM POSITIVE COCCI IN CLUSTERS Performed at [facility], [address], [place], [place] [address]    Culture FEW METHICILLIN RESISTANT STAPHYLOCOCCUS AUREUS  Final   Report Status 04/05/2019 FINAL  Final   Organism ID, Bacteria METHICILLIN RESISTANT STAPHYLOCOCCUS AUREUS  Final      Susceptibility   Methicillin resistant staphylococcus aureus - MIC*    CIPROFLOXACIN >=8 RESISTANT Resistant     ERYTHROMYCIN >=8 RESISTANT Resistant     GENTAMICIN <=0.5 SENSITIVE Sensitive     OXACILLIN >=4 RESISTANT Resistant     TETRACYCLINE <=1 SENSITIVE Sensitive     VANCOMYCIN 1 SENSITIVE Sensitive     TRIMETH/SULFA >=320 RESISTANT Resistant     CLINDAMYCIN <=0.25 SENSITIVE Sensitive     RIFAMPIN <=0.5 SENSITIVE Sensitive      Inducible Clindamycin NEGATIVE Sensitive     * FEW METHICILLIN RESISTANT STAPHYLOCOCCUS AUREUS  C difficile quick scan w PCR reflex     Status: None   Collection Time: 04/04/19 12:54 PM   Specimen: STOOL  Result Value Ref Range Status   C Diff antigen NEGATIVE NEGATIVE Final   C Diff toxin NEGATIVE NEGATIVE Final   C Diff interpretation No C. difficile detected.  Final    Comment: Performed at [facility], [address] [place], [place]  [address]  Culture, respiratory (non-expectorated)     Status: None (Preliminary result)   Collection Time: 04/06/19  5:27 PM   Specimen: Tracheal Aspirate; Respiratory  Result Value Ref Range Status   Specimen Description   Final    TRACHEAL ASPIRATE Performed at [facility], [address] [place], [place] [address]    Special Requests   Final    NONE Performed at [facility], [address] [place], [place] [address]    Gram Stain   Final    MODERATE WBC PRESENT, PREDOMINANTLY PMN ABUNDANT GRAM POSITIVE COCCI IN CLUSTERS Performed at [facility], [address], [place], [place] [address]    Culture FEW STAPHYLOCOCCUS AUREUS  Final   Report Status PENDING  Incomplete    Best Practice/Protocols:  VTE Prophylaxis: Lovenox (prophylaxtic dose) GI Prophylaxis: Antihistamine Continous Sedation  Events: 9/12 admitted to ICU via ED, intubated and unresponsive 9/12CXR suspicious for right lobe CAP, Lactic acid elevated 9/12Possible seizure noted, CT head showed no intracranial abnormalities 9/13MRI brain showed no abnormalities 9/15Remains minimally responsive and intubated without sedation 9/15EEG showed severe encephalopathy with no seizure activity 9/16Significantly more responsive, following commands 9/16Extubated in ICU. Immediate copious emesis and aspiration occurred 9/16Re-intubated and bronchoscopy performed to clear aspirate +MRSA pneunomonia 9/18 recurrent fever on vancomycin,  Unasyn added for potential anaerobes given aspiration on a recultured  Studies: Dg Abd 1 View  Result Date: 04/04/2019 CLINICAL DATA:  Nasogastric tube placement. EXAM: ABDOMEN - 1 VIEW COMPARISON:  One view abdomen 03/24/2019. FINDINGS: 1234 hours. Nasogastric tube projects over the mid lumbar spine, consistent with location in the mid stomach. No significant residual gastric distention. There is no dilated small bowel within the visualized upper abdomen. Patchy airspace opacities are present at both lung bases. These are better seen on concurrent chest radiographs. IMPRESSION: Nasogastric tube projects to the level of the mid stomach. Electronically Signed   By: Carey Bullocks M.D.   On: 04/04/2019 12:57   Dg Abdomen 1 View  Result Date: 04/18/2019 CLINICAL DATA:  Unresponsive.  NG tube placement EXAM: ABDOMEN - 1 VIEW COMPARISON:  None. FINDINGS: 1149 hours. Prominent gastric bubble noted. NG tube tip is in the mid stomach with proximal port of the NG tube below the EG junction. No gaseous bowel distention within the visualized abdomen. Visualized bony anatomy unremarkable. IMPRESSION: NG tube tip is in the mid stomach. Electronically Signed   By: Kennith Center M.D.   On: 04/08/2019 12:25   Ct Head Wo Contrast  Result Date: 04/10/2019 CLINICAL DATA:  Altered level of consciousness. EXAM: CT HEAD WITHOUT CONTRAST TECHNIQUE: Contiguous axial images were obtained from the base of the skull through the vertex without intravenous contrast. COMPARISON:  None. FINDINGS: Brain: There is no evidence for acute hemorrhage, hydrocephalus, mass lesion, or abnormal extra-axial fluid collection. No definite CT evidence for acute infarction. Vascular: No hyperdense vessel or unexpected calcification. Skull: No evidence for fracture. No worrisome lytic or sclerotic lesion. Sinuses/Orbits: The visualized paranasal sinuses and mastoid air cells are clear. Visualized portions of the globes and intraorbital fat are  unremarkable. Other: None. IMPRESSION: Unremarkable CT evaluation of the brain. No acute intracranial abnormality. Electronically Signed   By: Kennith Center M.D.   On: 04/02/2019 12:46   Mr Brain Wo Contrast  Result Date: 04/01/2019 CLINICAL DATA:  Acute presentation unresponsive. EXAM: MRI HEAD WITHOUT CONTRAST TECHNIQUE: Multiplanar, multiecho pulse sequences of the brain and surrounding structures were obtained without intravenous contrast. COMPARISON:  Head  CT 04/17/2019 FINDINGS: Brain: Diffusion imaging does not show any acute or subacute infarction. Brainstem and cerebellum are normal. Cerebral hemispheres show moderate changes of chronic small vessel disease affecting the deep and subcortical white matter. No cortical or large vessel territory infarction. No mass lesion, hemorrhage, hydrocephalus or extra-axial collection. Vascular: Major vessels at the base of the brain show flow. Skull and upper cervical spine: Negative Sinuses/Orbits: Clear/normal Other: None IMPRESSION: No abnormality seen to explain the clinical presentation. No acute or subacute infarction or any reversible finding. Moderate chronic small-vessel ischemic changes the cerebral hemispheric white matter. Electronically Signed   By: Paulina FusiMark  Shogry M.D.   On: 04/01/2019 14:55   Mr Laqueta JeanBrain W ZOWo Contrast  Result Date: 04/03/2019 CLINICAL DATA:  In cephalopathy. EXAM: MRI HEAD WITHOUT AND WITH CONTRAST TECHNIQUE: Multiplanar, multiecho pulse sequences of the brain and surrounding structures were obtained without and with intravenous contrast. CONTRAST:  7mL GADAVIST GADOBUTROL 1 MMOL/ML IV SOLN COMPARISON:  04/01/2019 FINDINGS: Brain: Diffusion imaging does not show any acute or subacute infarction. The brainstem and cerebellum are normal. Cerebral hemispheres show moderate chronic small-vessel ischemic changes of the white matter. No cortical or large vessel territory infarction. No mass lesion, hemorrhage, hydrocephalus or extra-axial  collection. Vascular: Major vessels at the base of the brain show flow. Skull and upper cervical spine: Negative Sinuses/Orbits: Clear/normal. Other: Nasopharyngeal fluid related to intubation. IMPRESSION: No change. No acute intracranial finding. Moderate chronic small-vessel ischemic changes of the white matter. Electronically Signed   By: Paulina FusiMark  Shogry M.D.   On: 04/03/2019 18:58   Mr Cervical Spine W Wo Contrast  Result Date: 04/03/2019 CLINICAL DATA:  Myelopathy.  Weakness of the extremities. EXAM: MRI CERVICAL SPINE WITHOUT AND WITH CONTRAST TECHNIQUE: Multiplanar and multiecho pulse sequences of the cervical spine, to include the craniocervical junction and cervicothoracic junction, were obtained without and with intravenous contrast. CONTRAST:  7mL GADAVIST GADOBUTROL 1 MMOL/ML IV SOLN COMPARISON:  None. FINDINGS: Alignment: Normal Vertebrae: No fracture or primary bone lesion. Cord: No primary cord lesion.  See below regarding stenosis. Posterior Fossa, vertebral arteries, paraspinal tissues: Negative Disc levels: No abnormality at foramen magnum, C1-2 or C2-3. C3-4: Uncovertebral hypertrophy and facet arthropathy on the left. Left foraminal stenosis could affect the C4 nerve. No central canal stenosis. C4-5: Endplate osteophytes and mild bulging of the disc. Facet arthropathy on the left. Mild left foraminal narrowing. C5-6: Broad-based, right posterolateral predominant disc herniation effaces the ventral subarachnoid space and indents the right side of the cord. Proximal foraminal encroachment on the right. Facet arthropathy on the right. Right foraminal narrowing could affect the C6 nerve. C6-7: Spondylosis with endplate osteophytes and shallow protrusion of the disc. Narrowing the ventral subarachnoid space but no compression of the cord. Bilateral foraminal narrowing. C7-T1: No disc pathology.  Mild facet hypertrophy. T1-2: Normal. No significant enhancing pathology. IMPRESSION: No primary cord  pathology. C3-4: Left foraminal stenosis due to encroachment by uncovertebral osteophytes and facet arthropathy could affect the left C4 nerve. C4-5: Mild left foraminal narrowing due to uncovertebral prominence and facet degeneration, without definite compressive stenosis. C5-6: Broad-based, right posterolateral predominant disc herniation with effacement of the ventral subarachnoid space and slight deformity of the right side of the cord. Facet arthropathy on the right. Right foraminal stenosis could affect the right C6 nerve. C6-7: Chronic degenerative spondylosis. Narrowing of the ventral subarachnoid space but no compression of the cord. Bilateral foraminal narrowing could affect either C7 nerve. Electronically Signed   By: Scherrie BatemanMark  Shogry M.D.  On: 04/03/2019 19:02   Dg Chest Port 1 View  Result Date: 04/07/2019 CLINICAL DATA:  Acute respiratory distress.  Follow-up exam. EXAM: PORTABLE CHEST 1 VIEW COMPARISON:  04/05/2019 and earlier exams. FINDINGS: Right upper lobe airspace consolidation is significantly improved, particularly compared to 04/04/2019. Lungs show bilateral interstitial prominence with patchy area airspace opacities more diffusely, the latter also improved the previous exam. No new lung abnormalities. No convincing pleural effusion and no pneumothorax. Endotracheal tube and nasal/orogastric tube are stable and well positioned. IMPRESSION: 1. Improved lung aeration compared to most recent prior exams. This is most evident in right upper lobe with decreased airspace lung opacity. 2. No new abnormalities.  Stable support apparatus. Electronically Signed   By: Amie Portland M.D.   On: 04/07/2019 05:58   Dg Chest Port 1 View  Result Date: 04/05/2019 CLINICAL DATA:  Acute respiratory failure EXAM: PORTABLE CHEST 1 VIEW COMPARISON:  04/04/2019 FINDINGS: Endotracheal tube and gastric catheter are again seen and stable. Cardiac shadow is stable. The lungs are well aerated bilaterally with patchy  bilateral airspace opacities similar to that seen on the prior exam. No new focal infiltrate is seen. No sizable effusion is noted. IMPRESSION: Multifocal pneumonia stable from prior exam. Electronically Signed   By: Alcide Clever M.D.   On: 04/05/2019 03:52   Dg Chest Port 1 View  Result Date: 04/04/2019 CLINICAL DATA:  Multilobar pneumonia.  Intubated patient. EXAM: PORTABLE CHEST 1 VIEW COMPARISON:  Radiographs 04/03/2019 and 04/02/2019. FINDINGS: 1233 hours. Mild patient rotation to the right. Tip of the endotracheal tube is in the mid trachea. Enteric tube projects below the diaphragm, tip not visualized. The heart size and mediastinal contours are stable. Right upper lobe airspace disease appears unchanged. There is slight worsening of the bibasilar airspace opacities. There are possible small bilateral pleural effusions. No pneumothorax. The bones appear unchanged. IMPRESSION: 1. Stable position of the endotracheal and nasogastric tubes. 2. Worsening basilar components of bilateral airspace opacities consistent with multilobar pneumonia. Possible small bilateral pleural effusions. Electronically Signed   By: Carey Bullocks M.D.   On: 04/04/2019 12:59   Dg Chest Port 1 View  Result Date: 04/03/2019 CLINICAL DATA:  Evaluate for pneumonia. EXAM: PORTABLE CHEST 1 VIEW COMPARISON:  Chest radiograph 04/02/2019, 04/01/2019 FINDINGS: Stable cardiomediastinal contours. The endotracheal tip terminates between the thoracic inlet and carina. Nasogastric tube courses below the diaphragm with distal aspect out of field of view. No pneumothorax or pleural effusion. There are persistent diffuse right-sided heterogeneous pulmonary opacities. The left lung is clear. Visualized bony skeleton is unremarkable. IMPRESSION: Persistent diffuse right-sided heterogeneous pulmonary opacities suspicious for infection. Electronically Signed   By: Emmaline Kluver M.D.   On: 04/03/2019 15:36   Dg Chest Port 1 View  Result  Date: 04/02/2019 CLINICAL DATA:  Respiratory failure. EXAM: PORTABLE CHEST 1 VIEW COMPARISON:  Radiograph of April 01, 2019. FINDINGS: Endotracheal and nasogastric tubes are unchanged in position. No pneumothorax is noted. No significant pleural effusion is noted. Left lung is clear. Stable right upper and lower lobe airspace opacities are noted concerning for pneumonia. Bony thorax is unremarkable. IMPRESSION: Stable right lung opacities are noted concerning for pneumonia. Stable support apparatus. Electronically Signed   By: Lupita Raider M.D.   On: 04/02/2019 07:46   Dg Chest Port 1 View  Result Date: 04/01/2019 CLINICAL DATA:  68 year old female with history of respiratory failure. EXAM: PORTABLE CHEST 1 VIEW COMPARISON:  Chest x-ray 2019/04/25. FINDINGS: An endotracheal tube is in place with  tip 5.1 cm above the carina. A nasogastric tube is seen extending into the stomach, however, the tip of the nasogastric tube extends below the lower margin of the image. Patchy multifocal interstitial and airspace disease noted throughout the right lung, most confluent in the right upper lobe, with slightly worsened aeration compared to yesterday's examination. Left lung is clear. No pleural effusions. No evidence of pulmonary edema. Heart size is normal. Upper mediastinal contours are within normal limits. IMPRESSION: 1. Severe multilobar pneumonia in the right lung, most pronounced in the right upper lobe, with worsening aeration compared to yesterday's examination. 2. Support apparatus, as above. Electronically Signed   By: Trudie Reedaniel  Entrikin M.D.   On: 04/01/2019 08:03   Dg Chest Port 1 View  Result Date: 03/23/2019 CLINICAL DATA:  Status post intubation and OG tube placement. EXAM: PORTABLE CHEST 1 VIEW COMPARISON:  No comparison studies available. FINDINGS: 1147 hours. Endotracheal tube tip is 5.2 cm above the base of the carina. The NG tube passes into the stomach although the distal tip position is not  included on the film. Volume loss noted right hemithorax patchy airspace disease noted right upper lobe and to a lesser degree in the right mid lung. Left lung appears hyperexpanded but clear. Interstitial markings are diffusely coarsened with chronic features. The cardiopericardial silhouette is within normal limits for size. IMPRESSION: Volume loss right hemithorax with right upper and mid lung patchy airspace disease. Endotracheal tube and NG tube as above. Electronically Signed   By: Kennith CenterEric  Mansell M.D.   On: 03/21/2019 12:23    Consults: Treatment Team:  [place] Groomriadhosp, Neuro1, MD   Subjective:    Overnight Issues: No overt issues overnight.  She did develop fever yesterday and Unasyn was added to vancomycin.  Sputum was recultured.  She was bronchoscoped on 9/16 after aspiration episode that led to intubation.   Objective:  Vital signs for last 24 hours: Temp:  [98 F (36.7 C)-100.4 F (38 C)] 99 F (37.2 C) (09/19 1600) Pulse Rate:  [75-116] 81 (09/19 1700) Resp:  [9-26] 9 (09/19 1700) BP: (133-159)/(63-86) 153/86 (09/19 1700) SpO2:  [92 %-99 %] 93 % (09/19 1700) FiO2 (%):  [35 %-40 %] 40 % (09/19 1700)  Hemodynamic parameters for last 24 hours:    Intake/Output from previous day: 09/18 0701 - 09/19 0700 In: 3384 [I.V.:1318.1; NG/GT:1470; IV Piggyback:595.9] Out: 2205 [Urine:1555; Stool:650]  Intake/Output this shift: Total I/O In: 1015.6 [I.V.:351.2; NG/GT:410; IV Piggyback:254.4] Out: 895 [Urine:895]  Vent settings for last 24 hours: Vent Mode: PSV FiO2 (%):  [35 %-40 %] 40 % Set Rate:  [20 bmp] 20 bmp Vt Set:  [450 mL] 450 mL PEEP:  [5 cmH20] 5 cmH20 Pressure Support:  [5 cmH20] 5 cmH20 Plateau Pressure:  [15 cmH20-18 cmH20] 18 cmH20  Physical Exam:  Gen: Intubated, RASS -1, not F/C consistently, fidgeting in bed HEENT: NCAT, sclerae anicteric Neck: No LAN, no JVD noted, trachea midline Lungs: Scattered rhonchi, no wheezes.  Synchronous with vent Cardiovascular:  Regular, no M noted Abdomen: Soft, NT, +BS Ext: Warm, no edema Neuro: No overt deficit noted, she moves all 4 spontaneously.  Fidgety, not following commands reliably Skin: No lesions noted  Assessment/Plan:  IMPRESSION: Acute hypoxemic respiratory failure RUL community-acquired pneumonia followed by aspiration pneumonia Severe sepsis, resolving Sinus tachycardia, resolved Altered mental status, improving Concern for seizure, ruled out, Keppra discontinued.  PLAN/REC: Cont vent support - settings reviewed and adjusted Cont vent bundle Daily SBT if/when meets criteria, failed SBT this morning  Monitor temp, WBC count Micro and abx: Most recent culture pending, initial cult. MRSA, on vancomycin and Unasyn Initiated TF protocol 09/13 DVT px: enoxaparin Monitor CBC intermittently Transfuse per usual guidelines Sedation protocol: Precedex, fentanyl. RASS goal 0, -1   LOS: 7 days   Additional comments: Care coordination with bedside nurse performed today.  Discussed with RT.  Critical Care Total Time*: 35 Minutes  C. Danice Goltz, MD [facility] 04/07/2019  *Care during the described time interval was provided by me and/or other providers on the critical care team.  I have reviewed this patient's available data, including medical history, events of note, physical examination and test results as part of my evaluation.

## 2019-04-08 DIAGNOSIS — R410 Disorientation, unspecified: Secondary | ICD-10-CM

## 2019-04-08 LAB — PROCALCITONIN: Procalcitonin: 0.64 ng/mL

## 2019-04-08 LAB — CULTURE, RESPIRATORY W GRAM STAIN

## 2019-04-08 LAB — GLUCOSE, CAPILLARY
Glucose-Capillary: 146 mg/dL — ABNORMAL HIGH (ref 70–99)
Glucose-Capillary: 164 mg/dL — ABNORMAL HIGH (ref 70–99)
Glucose-Capillary: 160 mg/dL — ABNORMAL HIGH (ref 70–99)
Glucose-Capillary: 169 mg/dL — ABNORMAL HIGH (ref 70–99)
Glucose-Capillary: 160 mg/dL — ABNORMAL HIGH (ref 70–99)

## 2019-04-08 LAB — BASIC METABOLIC PANEL
Anion gap: 11 (ref 5–15)
BUN: 28 mg/dL — ABNORMAL HIGH (ref 8–23)
CO2: 29 mmol/L (ref 22–32)
Calcium: 9 mg/dL (ref 8.9–10.3)
Chloride: 103 mmol/L (ref 98–111)
Creatinine, Ser: 0.62 mg/dL (ref 0.44–1.00)
GFR calc Af Amer: >60 mL/min (ref >60)
GFR calc non Af Amer: >60 mL/min (ref >60)
Glucose, Bld: 149 mg/dL — ABNORMAL HIGH (ref 70–99)
Potassium: 4.5 mmol/L (ref 3.5–5.1)
Sodium: 143 mmol/L (ref 135–145)

## 2019-04-08 LAB — CBC
HCT: 37.3 % (ref 36.0–46.0)
Hemoglobin: 11.5 g/dL — ABNORMAL LOW (ref 12.0–15.0)
MCH: 30.7 pg (ref 26.0–34.0)
MCHC: 30.8 g/dL (ref 30.0–36.0)
MCV: 99.5 fL (ref 80.0–100.0)
Platelets: 420 10*3/uL — ABNORMAL HIGH (ref 150–400)
RBC: 3.75 MIL/uL — ABNORMAL LOW (ref 3.87–5.11)
RDW: 13.4 % (ref 11.5–15.5)
WBC: 19.4 10*3/uL — ABNORMAL HIGH (ref 4.0–10.5)
nRBC: 0.1 % (ref 0.0–0.2)

## 2019-04-08 LAB — VANCOMYCIN, TROUGH: Vancomycin Tr: 12 ug/mL — ABNORMAL LOW (ref 15–20)

## 2019-04-08 LAB — VANCOMYCIN, PEAK: Vancomycin Pk: 26 ug/mL — ABNORMAL LOW (ref 30–40)

## 2019-04-08 MED ORDER — FREE WATER
200.0000 mL | Freq: Four times a day (QID) | Status: DC
  Administered 2019-04-08 – 2019-04-09 (×5): 200 mL

## 2019-04-08 NOTE — Progress Notes (Signed)
Pt has been extremely aggravated throughout the shift tonight. Pt not able to follow commands and continued to attempt to self extubate. Sedation medications had to be changed in order to keep patient compliant with the ventilator.

## 2019-04-08 NOTE — Progress Notes (Signed)
Follow up - Critical Care Medicine Note  Patient Details:    Sonia Farrell is an 68 y.o. female  never smoker intubated in ED for decreased LOC and acute hypoxemic respiratory failure.  Patient overdosed intentionally.  CXR c/w PNA.  She was extubated (9/16) but vomited and aspirated and required reintubation.  Sputum consistent with MRSA.  Lines, Airways, Drains: Airway 8 mm (Active)  Secured at (cm) 24 cm 04/07/19 1615  Measured From Lips 04/07/19 1615  Secured Location Right 04/07/19 1517  Secured By [employer] Tube Holder 04/07/19 1517  Tube Holder Repositioned Yes 04/07/19 1517  Cuff Pressure (cm H2O) 25 cm H2O 04/07/19 0817  Site Condition Cool;Dry 04/07/19 1517     NG/OG Tube Orogastric 18 Fr. Documented cm marking at nare/ corner of mouth 68 cm (Active)  Cm Marking at Nare/Corner of Mouth (if applicable) 68 cm 04/07/19 1615  Site Assessment Clean;Dry;Intact 04/07/19 1615  Ongoing Placement Verification No change in cm markings or external length of tube from initial placement;No change in respiratory status;No acute changes, not attributed to clinical condition 04/07/19 1615  Status Infusing tube feed 04/07/19 1615  Drainage Appearance Bile;Bloody;Brown 03/20/2019 1725  Intake (mL) 90 mL 04/07/19 1000  Output (mL) 0 mL 04/06/19 2325     Rectal Tube/Pouch (Active)  Output (mL) 400 mL 04/07/19 0345  Intake (mL) 0 mL 04/06/19 2000     Urethral Catheter Kara Knight 14 Fr. (Active)  Indication for Insertion or Continuance of Catheter Therapy based on hourly urine output monitoring and documentation for critical condition (NOT STRICT I&O) 04/07/19 0800  Site Assessment Clean;Intact 04/07/19 0800  Catheter Maintenance Bag below level of bladder;Insertion date on drainage bag;No dependent loops;Catheter secured;Drainage bag/tubing not touching floor;Seal intact;Bag emptied prior to transport 04/07/19 0800  Collection Container Standard drainage bag 04/07/19 0800  Securement Method  Securing device (Describe) 04/07/19 0800  Urinary Catheter Interventions (if applicable) Unclamped 04/07/19 0345  Input (mL) 0 mL 04/06/19 2000  Output (mL) 50 mL 04/07/19 1700    Anti-infectives:  Anti-infectives (From admission, onward)   Start     Dose/Rate Route Frequency Ordered Stop   04/06/19 1800  Ampicillin-Sulbactam (UNASYN) 3 g in sodium chloride 0.9 % 100 mL IVPB     3 g 200 mL/hr over 30 Minutes Intravenous Every 6 hours 04/06/19 1727     04/06/19 0000  vancomycin (VANCOCIN) IVPB 750 mg/150 ml premix     750 mg 150 mL/hr over 60 Minutes Intravenous Every 12 hours 04/05/19 1423     04/05/19 1100  vancomycin (VANCOCIN) 1,500 mg in sodium chloride 0.9 % 500 mL IVPB     1,500 mg 250 mL/hr over 120 Minutes Intravenous  Once 04/05/19 1056 04/05/19 1339   04/09/2019 2200  Ampicillin-Sulbactam (UNASYN) 3 g in sodium chloride 0.9 % 100 mL IVPB  Status:  Discontinued     3 g 200 mL/hr over 30 Minutes Intravenous Every 6 hours 04/15/2019 1915 04/05/19 1056   04/18/2019 1230  cefTRIAXone (ROCEPHIN) 2 g in sodium chloride 0.9 % 100 mL IVPB  Status:  Discontinued     2 g 200 mL/hr over 30 Minutes Intravenous Every 24 hours 03/30/2019 1224 04/13/2019 1915   04/10/2019 1230  azithromycin (ZITHROMAX) 500 mg in sodium chloride 0.9 % 250 mL IVPB  Status:  Discontinued     500 mg 250 mL/hr over 60 Minutes Intravenous Every 24 hours 04/07/2019 1224 03/27/2019 1915      Microbiology: Results for orders placed or performed during  the hospital encounter of 03/27/2019  Urine culture     Status: None   Collection Time: 03/26/2019 11:12 AM   Specimen: Urine, Random  Result Value Ref Range Status   Specimen Description   Final    URINE, RANDOM Performed at [facility], [address] [place], [place] [address]    Special Requests   Final    NONE Performed at [facility], [address], [place], [place] [address]    Culture   Final    NO GROWTH Performed at [facility],  [place] [address], [place], [place] [address]    Report Status 04/02/2019 FINAL  Final  Blood Cultures (routine x 2)     Status: None   Collection Time: 04/06/2019 11:38 AM   Specimen: BLOOD  Result Value Ref Range Status   Specimen Description BLOOD BLOOD RIGHT HAND  Final   Special Requests   Final    BOTTLES DRAWN AEROBIC AND ANAEROBIC Blood Culture results may not be optimal due to an excessive volume of blood received in culture bottles   Culture   Final    NO GROWTH 5 DAYS Performed at [facility], [address] [place], [place] [address]    Report Status 04/05/2019 FINAL  Final  SARS Coronavirus 2 [facility] order, Performed in [place] hospital lab) Nasopharyngeal     Status: None   Collection Time: 04/17/2019 11:38 AM   Specimen: Nasopharyngeal  Result Value Ref Range Status   SARS Coronavirus 2 NEGATIVE NEGATIVE Final    Comment: (NOTE) If result is NEGATIVE SARS-CoV-2 target nucleic acids are NOT DETECTED. The SARS-CoV-2 RNA is generally detectable in upper and lower  respiratory specimens during the acute phase of infection. The lowest  concentration of SARS-CoV-2 viral copies this assay can detect is 250  copies / mL. A negative result does not preclude SARS-CoV-2 infection  and should not be used as the sole basis for treatment or other  patient management decisions.  A negative result may occur with  improper specimen collection / handling, submission of specimen other  than nasopharyngeal swab, presence of viral mutation(s) within the  areas targeted by this assay, and inadequate number of viral copies  (<250 copies / mL). A negative result must be combined with clinical  observations, patient history, and epidemiological information. If result is POSITIVE SARS-CoV-2 target nucleic acids are DETECTED. The SARS-CoV-2 RNA is generally detectable in upper and lower  respiratory specimens dur ing the acute phase of infection.  Positive  results are indicative of  active infection with SARS-CoV-2.  Clinical  correlation with patient history and other diagnostic information is  necessary to determine patient infection status.  Positive results do  not rule out bacterial infection or co-infection with other viruses. If result is PRESUMPTIVE POSTIVE SARS-CoV-2 nucleic acids MAY BE PRESENT.   A presumptive positive result was obtained on the submitted specimen  and confirmed on repeat testing.  While 2019 novel coronavirus  (SARS-CoV-2) nucleic acids may be present in the submitted sample  additional confirmatory testing may be necessary for epidemiological  and / or clinical management purposes  to differentiate between  SARS-CoV-2 and other Sarbecovirus currently known to infect humans.  If clinically indicated additional testing with an alternate test  methodology [phone]) is advised. The SARS-CoV-2 RNA is generally  detectable in upper and lower respiratory sp ecimens during the acute  phase of infection. The expected result is Negative. Fact Sheet for Patients:  BoilerBrush.com.cy Fact Sheet for Healthcare Providers: https://pope.com/ This test is not yet approved or cleared by the [place] FDA and has been authorized for detection and/or diagnosis of SARS-CoV-2 by FDA under an Emergency Use Authorization (EUA).  This EUA will remain in effect (meaning this test can be used) for the duration of the COVID-19 declaration under Section 564(b)(1) of the Act, 21 U.S.C. section 360bbb-3(b)(1), unless the authorization is terminated or revoked sooner. Performed at [facility], [address] [place], [place] [address]   Blood Cultures (routine x 2)     Status: None   Collection Time: 04/26/19 11:39 AM   Specimen: BLOOD  Result Value Ref Range Status   Specimen Description BLOOD BLOOD RIGHT FOREARM  Final   Special Requests   Final    BOTTLES DRAWN AEROBIC AND ANAEROBIC Blood  Culture adequate volume   Culture   Final    NO GROWTH 5 DAYS Performed at [facility], [address] [place], [place] [address]    Report Status 04/05/2019 FINAL  Final  MRSA PCR Screening     Status: None   Collection Time: 04/26/19  5:46 PM   Specimen: Nasopharyngeal  Result Value Ref Range Status   MRSA by PCR NEGATIVE NEGATIVE Final    Comment:        The GeneXpert MRSA Assay (FDA approved for NASAL specimens only), is one component of a comprehensive MRSA colonization surveillance program. It is not intended to diagnose MRSA infection nor to guide or monitor treatment for MRSA infections. Performed at [facility], [address] [place], [place] [address]   Culture, respiratory     Status: None   Collection Time: 04/02/19  4:50 PM   Specimen: SPU  Result Value Ref Range Status   Specimen Description   Final    SPUTUM Performed at [facility], [address] [place], [place] [address]    Special Requests   Final    NONE Performed at [facility], [address] [place], [place] [address]    Gram Stain   Final    ABUNDANT WBC PRESENT, PREDOMINANTLY PMN NO SQUAMOUS EPITHELIAL CELLS SEEN FEW GRAM POSITIVE COCCI IN CLUSTERS Performed at [facility], [address] [place], [place] [address]    Culture FEW METHICILLIN RESISTANT STAPHYLOCOCCUS AUREUS  Final   Report Status 04/05/2019 FINAL  Final   Organism ID, Bacteria METHICILLIN RESISTANT STAPHYLOCOCCUS AUREUS  Final      Susceptibility   Methicillin resistant staphylococcus aureus - MIC*    CIPROFLOXACIN >=8 RESISTANT Resistant     ERYTHROMYCIN >=8 RESISTANT Resistant     GENTAMICIN <=0.5 SENSITIVE Sensitive     OXACILLIN >=4 RESISTANT Resistant     TETRACYCLINE <=1 SENSITIVE Sensitive     VANCOMYCIN 1 SENSITIVE Sensitive     TRIMETH/SULFA >=320 RESISTANT Resistant     CLINDAMYCIN <=0.25 SENSITIVE Sensitive     RIFAMPIN <=0.5 SENSITIVE Sensitive      Inducible Clindamycin NEGATIVE Sensitive     * FEW METHICILLIN RESISTANT STAPHYLOCOCCUS AUREUS  C difficile quick scan w PCR reflex     Status: None   Collection Time: 04/04/19 12:54 PM   Specimen: STOOL  Result Value Ref Range Status   C Diff antigen NEGATIVE NEGATIVE Final   C Diff toxin NEGATIVE NEGATIVE Final   C Diff interpretation No C. difficile detected.  Final    Comment: Performed at [facility], [address] [place], [place]  [address]  Culture, respiratory (non-expectorated)     Status: None   Collection Time: 04/06/19  5:27 PM   Specimen: Tracheal Aspirate; Respiratory  Result Value Ref Range Status   Specimen Description   Final    TRACHEAL ASPIRATE Performed at [facility], [address], [place], [place] [address]    Special Requests   Final    NONE Performed at [facility], [address] [place], [place] [address]    Gram Stain   Final    MODERATE WBC PRESENT, PREDOMINANTLY PMN ABUNDANT GRAM POSITIVE COCCI IN CLUSTERS Performed at [facility], [address], [place], [place] [address]    Culture FEW METHICILLIN RESISTANT STAPHYLOCOCCUS AUREUS  Final   Report Status 04/08/2019 FINAL  Final   Organism ID, Bacteria METHICILLIN RESISTANT STAPHYLOCOCCUS AUREUS  Final      Susceptibility   Methicillin resistant staphylococcus aureus - MIC*    CIPROFLOXACIN >=8 RESISTANT Resistant     ERYTHROMYCIN >=8 RESISTANT Resistant     GENTAMICIN <=0.5 SENSITIVE Sensitive     OXACILLIN >=4 RESISTANT Resistant     TETRACYCLINE <=1 SENSITIVE Sensitive     VANCOMYCIN 1 SENSITIVE Sensitive     TRIMETH/SULFA >=320 RESISTANT Resistant     CLINDAMYCIN <=0.25 SENSITIVE Sensitive     RIFAMPIN <=0.5 SENSITIVE Sensitive     Inducible Clindamycin NEGATIVE Sensitive     * FEW METHICILLIN RESISTANT STAPHYLOCOCCUS AUREUS    Best Practice/Protocols:  VTE Prophylaxis: Lovenox (prophylaxtic dose) GI Prophylaxis: Antihistamine Continous  Sedation  Events: 9/12 admitted to ICU via ED, intubated and unresponsive 9/12CXR suspicious for right lobe CAP, Lactic acid elevated 9/12Possible seizure noted, CT head showed no intracranial abnormalities 9/13MRI brain showed no abnormalities 9/15Remains minimally responsive and intubated without sedation 9/15EEG showed severe encephalopathy with no seizure activity 9/16Significantly more responsive, following commands 9/16Extubated in ICU. Immediate copious emesis and aspiration occurred 9/16Re-intubated and bronchoscopy performed to clear aspirate +MRSA pneunomonia 9/18 recurrent fever on vancomycin, Unasyn added for potential anaerobes given aspiration, recultured 9/20 final micro pending, continued issues with agitation.  Restart home antidepressant/anxiolytic  Studies: Dg Abd 1 View  Result Date: 04/04/2019 CLINICAL DATA:  Nasogastric tube placement. EXAM: ABDOMEN - 1 VIEW COMPARISON:  One view abdomen 04/10/2019. FINDINGS: 1234 hours. Nasogastric tube projects over the mid lumbar spine, consistent with location in the mid stomach. No significant residual gastric distention. There is no dilated small bowel within the visualized upper abdomen. Patchy airspace opacities are present at both lung bases. These are better seen on concurrent chest radiographs. IMPRESSION: Nasogastric tube projects to the level of the mid stomach. Electronically Signed   By: Carey Bullocks M.D.   On: 04/04/2019 12:57   Dg Abdomen 1 View  Result Date: 04/17/2019 CLINICAL DATA:  Unresponsive.  NG tube placement EXAM: ABDOMEN - 1 VIEW COMPARISON:  None. FINDINGS: 1149 hours. Prominent gastric bubble noted. NG tube tip is in the mid stomach with proximal port of the NG tube below the EG junction. No gaseous bowel distention within the visualized abdomen. Visualized bony anatomy unremarkable. IMPRESSION: NG tube tip is in the mid stomach. Electronically Signed   By: Kennith Center M.D.   On: 03/29/2019 12:25    Ct Head Wo Contrast  Result Date: 03/27/2019 CLINICAL DATA:  Altered level of consciousness. EXAM: CT HEAD WITHOUT CONTRAST TECHNIQUE: Contiguous axial images were obtained from the base of the skull through the vertex without intravenous contrast. COMPARISON:  None. FINDINGS: Brain: There is no  evidence for acute hemorrhage, hydrocephalus, mass lesion, or abnormal extra-axial fluid collection. No definite CT evidence for acute infarction. Vascular: No hyperdense vessel or unexpected calcification. Skull: No evidence for fracture. No worrisome lytic or sclerotic lesion. Sinuses/Orbits: The visualized paranasal sinuses and mastoid air cells are clear. Visualized portions of the globes and intraorbital fat are unremarkable. Other: None. IMPRESSION: Unremarkable CT evaluation of the brain. No acute intracranial abnormality. Electronically Signed   By: Kennith Center M.D.   On: 12-Apr-2019 12:46   Mr Brain Wo Contrast  Result Date: 04/01/2019 CLINICAL DATA:  Acute presentation unresponsive. EXAM: MRI HEAD WITHOUT CONTRAST TECHNIQUE: Multiplanar, multiecho pulse sequences of the brain and surrounding structures were obtained without intravenous contrast. COMPARISON:  Head CT April 12, 2019 FINDINGS: Brain: Diffusion imaging does not show any acute or subacute infarction. Brainstem and cerebellum are normal. Cerebral hemispheres show moderate changes of chronic small vessel disease affecting the deep and subcortical white matter. No cortical or large vessel territory infarction. No mass lesion, hemorrhage, hydrocephalus or extra-axial collection. Vascular: Major vessels at the base of the brain show flow. Skull and upper cervical spine: Negative Sinuses/Orbits: Clear/normal Other: None IMPRESSION: No abnormality seen to explain the clinical presentation. No acute or subacute infarction or any reversible finding. Moderate chronic small-vessel ischemic changes the cerebral hemispheric white matter. Electronically Signed    By: Paulina Fusi M.D.   On: 04/01/2019 14:55   Mr Laqueta Jean ON Contrast  Result Date: 04/03/2019 CLINICAL DATA:  In cephalopathy. EXAM: MRI HEAD WITHOUT AND WITH CONTRAST TECHNIQUE: Multiplanar, multiecho pulse sequences of the brain and surrounding structures were obtained without and with intravenous contrast. CONTRAST:  7mL GADAVIST GADOBUTROL 1 MMOL/ML IV SOLN COMPARISON:  04/01/2019 FINDINGS: Brain: Diffusion imaging does not show any acute or subacute infarction. The brainstem and cerebellum are normal. Cerebral hemispheres show moderate chronic small-vessel ischemic changes of the white matter. No cortical or large vessel territory infarction. No mass lesion, hemorrhage, hydrocephalus or extra-axial collection. Vascular: Major vessels at the base of the brain show flow. Skull and upper cervical spine: Negative Sinuses/Orbits: Clear/normal. Other: Nasopharyngeal fluid related to intubation. IMPRESSION: No change. No acute intracranial finding. Moderate chronic small-vessel ischemic changes of the white matter. Electronically Signed   By: Paulina Fusi M.D.   On: 04/03/2019 18:58   Mr Cervical Spine W Wo Contrast  Result Date: 04/03/2019 CLINICAL DATA:  Myelopathy.  Weakness of the extremities. EXAM: MRI CERVICAL SPINE WITHOUT AND WITH CONTRAST TECHNIQUE: Multiplanar and multiecho pulse sequences of the cervical spine, to include the craniocervical junction and cervicothoracic junction, were obtained without and with intravenous contrast. CONTRAST:  7mL GADAVIST GADOBUTROL 1 MMOL/ML IV SOLN COMPARISON:  None. FINDINGS: Alignment: Normal Vertebrae: No fracture or primary bone lesion. Cord: No primary cord lesion.  See below regarding stenosis. Posterior Fossa, vertebral arteries, paraspinal tissues: Negative Disc levels: No abnormality at foramen magnum, C1-2 or C2-3. C3-4: Uncovertebral hypertrophy and facet arthropathy on the left. Left foraminal stenosis could affect the C4 nerve. No central canal  stenosis. C4-5: Endplate osteophytes and mild bulging of the disc. Facet arthropathy on the left. Mild left foraminal narrowing. C5-6: Broad-based, right posterolateral predominant disc herniation effaces the ventral subarachnoid space and indents the right side of the cord. Proximal foraminal encroachment on the right. Facet arthropathy on the right. Right foraminal narrowing could affect the C6 nerve. C6-7: Spondylosis with endplate osteophytes and shallow protrusion of the disc. Narrowing the ventral subarachnoid space but no compression of the cord. Bilateral foraminal narrowing.  C7-T1: No disc pathology.  Mild facet hypertrophy. T1-2: Normal. No significant enhancing pathology. IMPRESSION: No primary cord pathology. C3-4: Left foraminal stenosis due to encroachment by uncovertebral osteophytes and facet arthropathy could affect the left C4 nerve. C4-5: Mild left foraminal narrowing due to uncovertebral prominence and facet degeneration, without definite compressive stenosis. C5-6: Broad-based, right posterolateral predominant disc herniation with effacement of the ventral subarachnoid space and slight deformity of the right side of the cord. Facet arthropathy on the right. Right foraminal stenosis could affect the right C6 nerve. C6-7: Chronic degenerative spondylosis. Narrowing of the ventral subarachnoid space but no compression of the cord. Bilateral foraminal narrowing could affect either C7 nerve. Electronically Signed   By: Paulina Fusi M.D.   On: 04/03/2019 19:02   Dg Chest Port 1 View  Result Date: 04/07/2019 CLINICAL DATA:  Acute respiratory distress.  Follow-up exam. EXAM: PORTABLE CHEST 1 VIEW COMPARISON:  04/05/2019 and earlier exams. FINDINGS: Right upper lobe airspace consolidation is significantly improved, particularly compared to 04/04/2019. Lungs show bilateral interstitial prominence with patchy area airspace opacities more diffusely, the latter also improved the previous exam. No new  lung abnormalities. No convincing pleural effusion and no pneumothorax. Endotracheal tube and nasal/orogastric tube are stable and well positioned. IMPRESSION: 1. Improved lung aeration compared to most recent prior exams. This is most evident in right upper lobe with decreased airspace lung opacity. 2. No new abnormalities.  Stable support apparatus. Electronically Signed   By: Amie Portland M.D.   On: 04/07/2019 05:58   Dg Chest Port 1 View  Result Date: 04/05/2019 CLINICAL DATA:  Acute respiratory failure EXAM: PORTABLE CHEST 1 VIEW COMPARISON:  04/04/2019 FINDINGS: Endotracheal tube and gastric catheter are again seen and stable. Cardiac shadow is stable. The lungs are well aerated bilaterally with patchy bilateral airspace opacities similar to that seen on the prior exam. No new focal infiltrate is seen. No sizable effusion is noted. IMPRESSION: Multifocal pneumonia stable from prior exam. Electronically Signed   By: Alcide Clever M.D.   On: 04/05/2019 03:52   Dg Chest Port 1 View  Result Date: 04/04/2019 CLINICAL DATA:  Multilobar pneumonia.  Intubated patient. EXAM: PORTABLE CHEST 1 VIEW COMPARISON:  Radiographs 04/03/2019 and 04/02/2019. FINDINGS: 1233 hours. Mild patient rotation to the right. Tip of the endotracheal tube is in the mid trachea. Enteric tube projects below the diaphragm, tip not visualized. The heart size and mediastinal contours are stable. Right upper lobe airspace disease appears unchanged. There is slight worsening of the bibasilar airspace opacities. There are possible small bilateral pleural effusions. No pneumothorax. The bones appear unchanged. IMPRESSION: 1. Stable position of the endotracheal and nasogastric tubes. 2. Worsening basilar components of bilateral airspace opacities consistent with multilobar pneumonia. Possible small bilateral pleural effusions. Electronically Signed   By: Carey Bullocks M.D.   On: 04/04/2019 12:59   Dg Chest Port 1 View  Result Date:  04/03/2019 CLINICAL DATA:  Evaluate for pneumonia. EXAM: PORTABLE CHEST 1 VIEW COMPARISON:  Chest radiograph 04/02/2019, 04/01/2019 FINDINGS: Stable cardiomediastinal contours. The endotracheal tip terminates between the thoracic inlet and carina. Nasogastric tube courses below the diaphragm with distal aspect out of field of view. No pneumothorax or pleural effusion. There are persistent diffuse right-sided heterogeneous pulmonary opacities. The left lung is clear. Visualized bony skeleton is unremarkable. IMPRESSION: Persistent diffuse right-sided heterogeneous pulmonary opacities suspicious for infection. Electronically Signed   By: Emmaline Kluver M.D.   On: 04/03/2019 15:36   Dg Chest [facility]  Result Date: 04/02/2019 CLINICAL DATA:  Respiratory failure. EXAM: PORTABLE CHEST 1 VIEW COMPARISON:  Radiograph of April 01, 2019. FINDINGS: Endotracheal and nasogastric tubes are unchanged in position. No pneumothorax is noted. No significant pleural effusion is noted. Left lung is clear. Stable right upper and lower lobe airspace opacities are noted concerning for pneumonia. Bony thorax is unremarkable. IMPRESSION: Stable right lung opacities are noted concerning for pneumonia. Stable support apparatus. Electronically Signed   By: Lupita RaiderJames  Green Jr M.D.   On: 04/02/2019 07:46   Dg Chest Port 1 View  Result Date: 04/01/2019 CLINICAL DATA:  68 year old female with history of respiratory failure. EXAM: PORTABLE CHEST 1 VIEW COMPARISON:  Chest x-ray 19-Jan-2019. FINDINGS: An endotracheal tube is in place with tip 5.1 cm above the carina. A nasogastric tube is seen extending into the stomach, however, the tip of the nasogastric tube extends below the lower margin of the image. Patchy multifocal interstitial and airspace disease noted throughout the right lung, most confluent in the right upper lobe, with slightly worsened aeration compared to yesterday's examination. Left lung is clear. No pleural effusions.  No evidence of pulmonary edema. Heart size is normal. Upper mediastinal contours are within normal limits. IMPRESSION: 1. Severe multilobar pneumonia in the right lung, most pronounced in the right upper lobe, with worsening aeration compared to yesterday's examination. 2. Support apparatus, as above. Electronically Signed   By: Trudie Reedaniel  Entrikin M.D.   On: 04/01/2019 08:03   Dg Chest Port 1 View  Result Date: 05-31-19 CLINICAL DATA:  Status post intubation and OG tube placement. EXAM: PORTABLE CHEST 1 VIEW COMPARISON:  No comparison studies available. FINDINGS: 1147 hours. Endotracheal tube tip is 5.2 cm above the base of the carina. The NG tube passes into the stomach although the distal tip position is not included on the film. Volume loss noted right hemithorax patchy airspace disease noted right upper lobe and to a lesser degree in the right mid lung. Left lung appears hyperexpanded but clear. Interstitial markings are diffusely coarsened with chronic features. The cardiopericardial silhouette is within normal limits for size. IMPRESSION: Volume loss right hemithorax with right upper and mid lung patchy airspace disease. Endotracheal tube and NG tube as above. Electronically Signed   By: Kennith CenterEric  Mansell M.D.   On: 19-Jan-2019 12:23    Consults: Treatment Team:  Kym Groom[facility]osp, Neuro1, MD   Subjective:    Overnight Issues: Overnight became more agitated and required propofol.  Has been the same today did not tolerate SBT.  Note that she had been on Xanax as an outpatient and Paxil.  Objective:  Vital signs for last 24 hours: Temp:  [98.4 F (36.9 C)-99.4 F (37.4 C)] 98.8 F (37.1 C) (09/20 1600) Pulse Rate:  [72-118] 73 (09/20 1800) Resp:  [17-24] 20 (09/20 1800) BP: (102-183)/(42-72) 128/52 (09/20 1800) SpO2:  [88 %-98 %] 95 % (09/20 1800) FiO2 (%):  [40 %] 40 % (09/20 1957)  Hemodynamic parameters for last 24 hours:    Intake/Output from previous day: 09/19 0701 - 09/20 0700 In:  2494.9 [I.V.:880.2; NG/GT:1010; IV Piggyback:604.6] Out: 2175 [Urine:1875; Stool:300]  Intake/Output this shift: No intake/output data recorded.  Vent settings for last 24 hours: Vent Mode: PRVC FiO2 (%):  [40 %] 40 % Set Rate:  [0 bmp-20 bmp] 20 bmp Vt Set:  [450 mL] 450 mL PEEP:  [5 cmH20] 5 cmH20 Plateau Pressure:  [15 cmH20-104 cmH20] 15 cmH20  Physical Exam:  Gen: Intubated, RASS -1 to -2, not F/C consistently, fidgeting  in bed, agitated at times HEENT: NCAT, sclerae anicteric Neck: No LAN, no JVD noted, trachea midline Lungs: Scattered rhonchi, no wheezes.  Synchronous with vent unless agitated Cardiovascular: Regular, no M noted Abdomen: Soft, NT, +BS Ext: Warm, no edema Neuro: No overt deficit noted, she moves all 4 spontaneously.  Fidgety, not following commands reliably, impulsive Skin: No lesions noted  Assessment/Plan:  IMPRESSION: Acute hypoxemic respiratory failure RUL community-acquired pneumonia followed by aspiration pneumonia Severe sepsis, resolving Sinus tachycardia, resolved Altered mental status, now with some agitation query ICU delirium versus withdrawal from benzos Concern for seizure, ruled out, Keppra discontinued.  PLAN/REC: Cont vent support - settings reviewed and adjusted Cont vent bundle Daily SBT if/when meets criteria, failed SBT again this morning Monitor temp, WBC count Micro and abx: Most recent culture pending, initial cult. MRSA, on vancomycin and Unasyn Initiated TF protocol 09/13 DVT px: enoxaparin Monitor CBC intermittently Transfuse per usual guidelines Sedation protocol: Precedex, fentanyl.  Propofol had to be started last night due to agitation RASS goal -1 Restart Paxil, Klonopin twice a day avoid Xanax.    LOS: 8 days   Additional comments: Care coordination with bedside nurse performed today.  Discussed with RT as well.  Critical Care Total Time*: 40 Minutes  C. Derrill Kay, MD [facility] 04/08/2019  *Care  during the described time interval was provided by me and/or other providers on the critical care team.  I have reviewed this patient's available data, including medical history, events of note, physical examination and test results as part of my evaluation.

## 2019-04-08 NOTE — Progress Notes (Signed)
[facility] [place] NAME: Sonia Farrell    MR#:  [id number]  DATE OF BIRTH:  04/19/1951  SUBJECTIVE:   Remains intubated this morning. Patient is intermittently agitated. Having a difficult time weaning from vent. Occasionally following commands.  REVIEW OF SYSTEMS:  ROS- unable to obtain due to intubation.  DRUG ALLERGIES:   Allergies  Allergen Reactions  . Escitalopram Oxalate Other (See Comments)    MUSCLE SPASMS   VITALS:  Blood pressure (!) 178/64, pulse (!) 115, temperature 99.4 F (37.4 C), temperature source Axillary, resp. rate 20, height 5\' 7"  (1.702 m), weight 75.3 kg, SpO2 93 %. PHYSICAL EXAMINATION:  Physical Exam  GENERAL:  Laying in the bed with no acute distress.  HEENT: Head atraumatic, normocephalic. Pupils equal, round, reactive to light and accommodation. No scleral icterus. Extraocular muscles intact. Oropharynx and nasopharynx clear. ETT in place NECK:  Supple, no jugular venous distention. No thyroid enlargement. LUNGS: +diminished breath sounds throughout all lung fields. No wheezes, crackles, rhonchi. No use of accessory muscles of respiration.  CARDIOVASCULAR: RRR, S1, S2 normal. No murmurs, rubs, or gallops.  ABDOMEN: Soft, nontender, nondistended. Bowel sounds present.  EXTREMITIES: No pedal edema, cyanosis, or clubbing.  NEUROLOGIC: intubated. +eyes open.  Does not follow commands. PSYCHIATRIC: intubated. SKIN: No obvious rash, lesion, or ulcer.  LABORATORY PANEL:  Female CBC Recent Labs  Lab 04/08/19 0500  WBC 19.4*  HGB 11.5*  HCT 37.3  PLT 420*   ------------------------------------------------------------------------------------------------------------------ Chemistries  Recent Labs  Lab 04/06/19 0555  04/08/19 0500  NA 146*   < > 143  K 4.4   < > 4.5  CL 111   < > 103  CO2 26   < > 29  GLUCOSE 216*   < > 149*  BUN 23   < > 28*  CREATININE 0.76   < > 0.62  CALCIUM 8.7*   < > 9.0   MG 2.6*  --   --    < > = values in this interval not displayed.   RADIOLOGY:  No results found. ASSESSMENT AND PLAN:   Acute hypoxic respiratory failure due to aspiration pneumonia- initially due to possible intentional overdose with benzos.  -Vent management per CCM -Intubated 9/12, extubated 9/16 and subsequently reintubated due to aspiration.  Bronchoscopy performed 9/16 to clear aspirate.  MRSA pneumonia and aspiration pneumonia- meeting sepsis criteria on admission with fever, tachycardia, and tachypnea. Procalcitonin elevated. Initially meeting sepsis criteria, but this has resolved.  -CXR with improved aeration -Sputum cultures growing MRSA -Continue vancomycin and unasyn -Continue IVFs -Blood cultures with no growth to date  Altered mental status due to unclear etiology- possible benzo overdose. AMS is slowly improving. -Now off keppra -Neurology following -MRI brain unremarkable -Repeat MRI brain and c-spine were unremarkable except for foraminal stenosis and some disc herniatiosn -EEG with moderate to severe diffuse encephalopathy  All the records are reviewed and case discussed with Care Management/Social Worker. Management plans discussed with the patient, family and they are in agreement.  CODE STATUS: Full Code  TOTAL TIME TAKING CARE OF THIS PATIENT: 34 minutes.   More than 50% of the time was spent in counseling/coordination of care: YES  POSSIBLE D/C unknown, DEPENDING ON CLINICAL CONDITION.   Berna Spare Makyle Eslick M.D on 04/08/2019 at 11:17 AM  Between 7am to 6pm - Pager - [phone]  After 6pm go to [email or web address] - Proofreader  [employer]  [phone]  CC: Primary care physician; Jerl MinaHedrick, James, MD  Note: This dictation was prepared with Dragon dictation along with smaller phrase technology. Any transcriptional errors that result from this process are unintentional.

## 2019-04-08 NOTE — Consult Note (Signed)
PHARMACY CONSULT NOTE - FOLLOW UP  Pharmacy Consult for Electrolyte Monitoring and Replacement   Recent Labs: Potassium (mmol/L)  Date Value  04/08/2019 4.5   Magnesium (mg/dL)  Date Value  04/06/2019 2.6 (H)   Calcium (mg/dL)  Date Value  04/08/2019 9.0   Albumin (g/dL)  Date Value  04/01/2019 2.9 (L)   Phosphorus (mg/dL)  Date Value  04/06/2019 3.5   Sodium (mmol/L)  Date Value  04/08/2019 143   Corrected calcium: 9.9 mg/dL  Assessment: 68 y/o F with acute hypoxic respiratory failure due to aspiration pneumonia in the setting of possible intentional overdose with benzos and alcohol. Patient was extubated 9/16 and vomited. Increased work of breathing on non-rebreather mask and was subsequently re-intubated. Patient on vancomycin for MRSA pneumonia. Some recent but C. Diff panel is negative. Patient is on SSI.   Hemolyzed sample makes true potassium level indeterminate. Based on past couple of days trend predict value is close to true level.   Goal of Therapy:  K ~4, Mg ~2, Phos ~2.5-4.5   Plan:  -Continue free water flushes at 150 mL q8h. Can consider decreasing if sodium continues to trend down tomorrow -No electrolyte deficiencies today will defer supplementation -Re-check BMP, Mg, and Phos tomorrow AM  [place] Resident 04/08/2019 8:02 AM

## 2019-04-09 ENCOUNTER — Inpatient Hospital Stay: Payer: Medicare Other

## 2019-04-09 LAB — MAGNESIUM: Magnesium: 2.5 mg/dL — ABNORMAL HIGH (ref 1.7–2.4)

## 2019-04-09 LAB — BASIC METABOLIC PANEL
Anion gap: 9 (ref 5–15)
BUN: 28 mg/dL — ABNORMAL HIGH (ref 8–23)
CO2: 30 mmol/L (ref 22–32)
Calcium: 8.8 mg/dL — ABNORMAL LOW (ref 8.9–10.3)
Chloride: 103 mmol/L (ref 98–111)
Creatinine, Ser: 0.65 mg/dL (ref 0.44–1.00)
GFR calc Af Amer: >60 mL/min (ref >60)
GFR calc non Af Amer: >60 mL/min (ref >60)
Glucose, Bld: 162 mg/dL — ABNORMAL HIGH (ref 70–99)
Potassium: 4.5 mmol/L (ref 3.5–5.1)
Sodium: 142 mmol/L (ref 135–145)

## 2019-04-09 LAB — GLUCOSE, CAPILLARY
Glucose-Capillary: 100 mg/dL — ABNORMAL HIGH (ref 70–99)
Glucose-Capillary: 108 mg/dL — ABNORMAL HIGH (ref 70–99)
Glucose-Capillary: 119 mg/dL — ABNORMAL HIGH (ref 70–99)
Glucose-Capillary: 127 mg/dL — ABNORMAL HIGH (ref 70–99)
Glucose-Capillary: 131 mg/dL — ABNORMAL HIGH (ref 70–99)
Glucose-Capillary: 141 mg/dL — ABNORMAL HIGH (ref 70–99)

## 2019-04-09 LAB — PHOSPHORUS: Phosphorus: 5.4 mg/dL — ABNORMAL HIGH (ref 2.5–4.6)

## 2019-04-09 IMAGING — DX DG CHEST 1V PORT
1 series · 1 of 1 positions shown · non-contrast
Comparison: [DATE] [DATE], [DATE], [DATE] [DATE], [DATE]

CLINICAL DATA: 68-year-old female with respiratory failure

EXAM:
PORTABLE CHEST 1 VIEW

[chest ap]
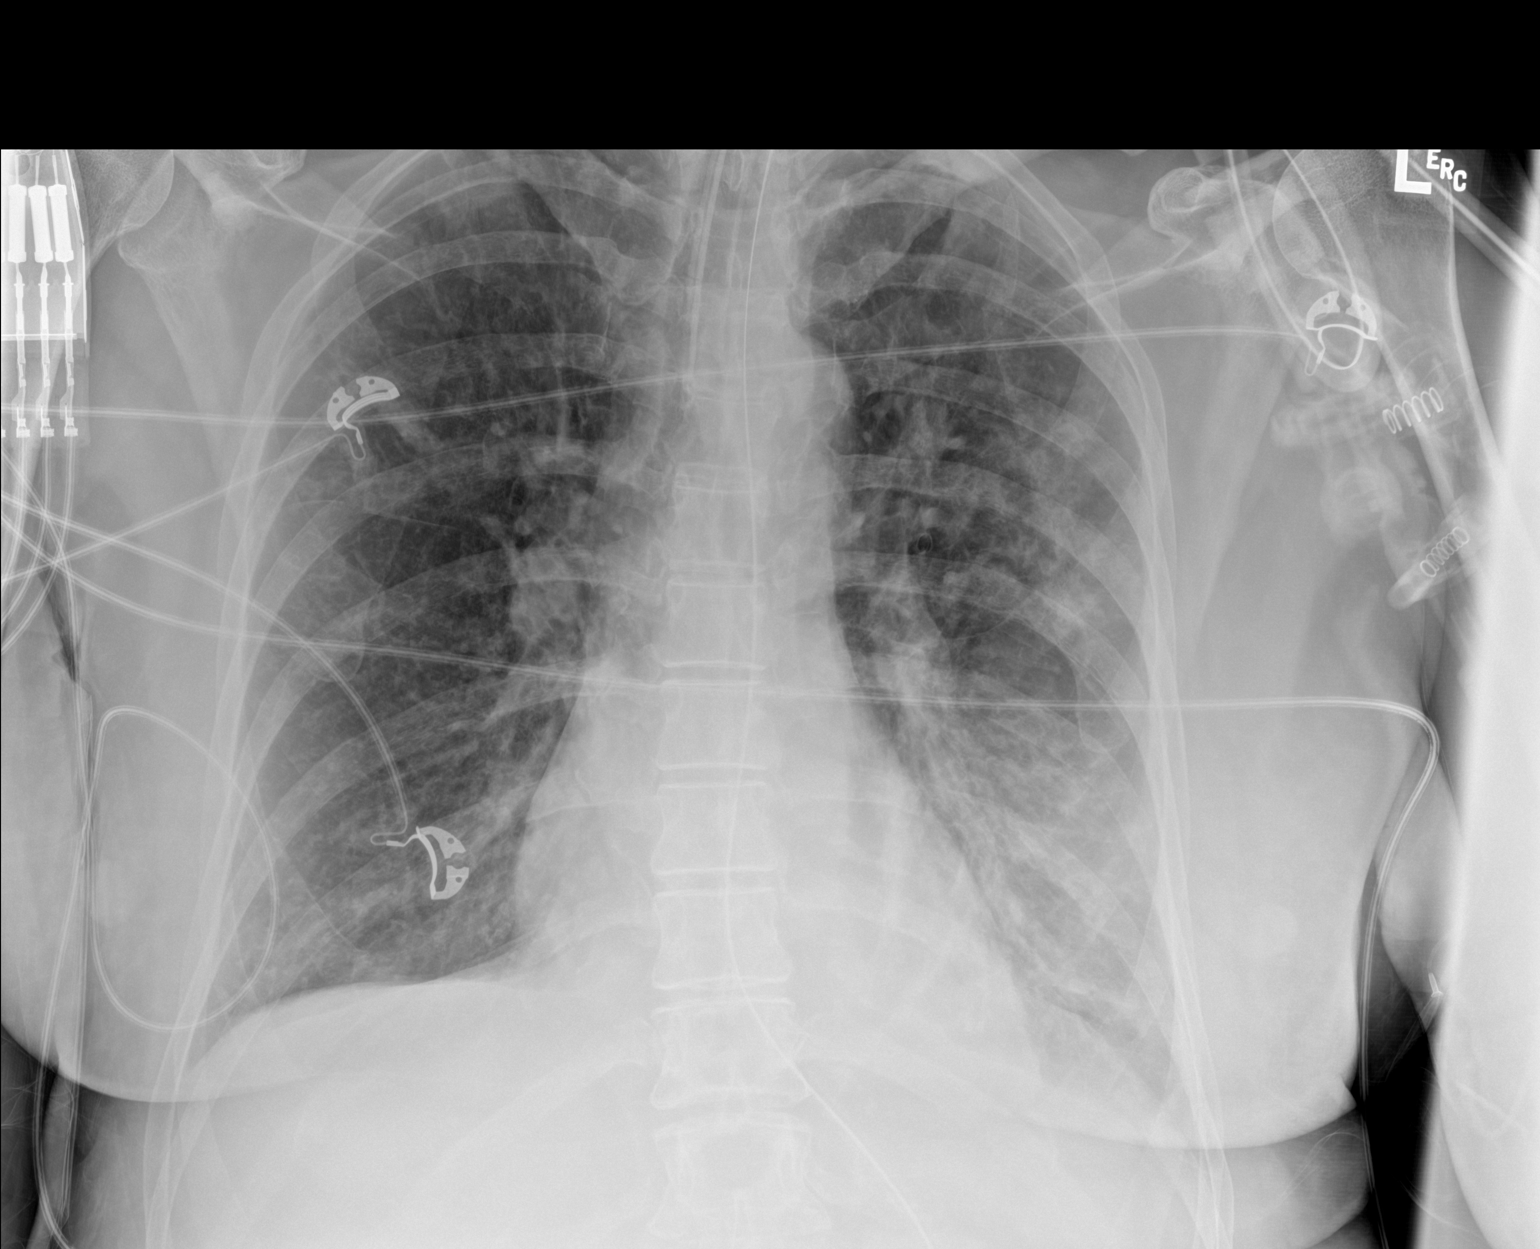

[1 of 1 positions shown; findings below may reference images not displayed]

FINDINGS: Cardiomediastinal silhouette unchanged in size and contour.

Unchanged endotracheal tube which terminates approximately 2.9 cm
above the carina. Unchanged gastric tube terminating out of the
field of view.

Reticulonodular opacities of the bilateral lungs, improving from the
plain film dated [DATE] no pneumothorax. No pleural
effusion.
IMPRESSION: Bilateral reticulonodular opacities persist, improved dating to the
plain films [DATE]

Unchanged endotracheal tube and gastric tube

## 2019-04-09 MED ORDER — PAROXETINE HCL 10 MG PO TABS
10.0000 mg | ORAL_TABLET | Freq: Every day | ORAL | Status: DC
  Administered 2019-04-09: 10:00:00 10 mg via NASOGASTRIC
  Filled 2019-04-09: qty 1

## 2019-04-09 MED ORDER — FUROSEMIDE 10 MG/ML IJ SOLN
40.0000 mg | Freq: Once | INTRAMUSCULAR | Status: AC
  Administered 2019-04-09: 11:00:00 40 mg via INTRAVENOUS
  Filled 2019-04-09: qty 4

## 2019-04-09 MED ORDER — PANTOPRAZOLE SODIUM 40 MG IV SOLR
40.0000 mg | Freq: Every day | INTRAVENOUS | Status: DC
  Administered 2019-04-09 – 2019-04-11 (×3): 40 mg via INTRAVENOUS
  Filled 2019-04-09 (×3): qty 40

## 2019-04-09 MED ORDER — CLONAZEPAM 0.5 MG PO TABS
0.5000 mg | ORAL_TABLET | Freq: Two times a day (BID) | ORAL | Status: DC
  Administered 2019-04-09: 10:00:00 0.5 mg via NASOGASTRIC
  Filled 2019-04-09: qty 1

## 2019-04-09 NOTE — Consult Note (Signed)
[facility] for Electrolyte Monitoring and Replacement   Recent Labs: Potassium (mmol/L)  Date Value  04/09/2019 4.5   Magnesium (mg/dL)  Date Value  04/09/2019 2.5 (H)   Calcium (mg/dL)  Date Value  04/09/2019 8.8 (L)   Albumin (g/dL)  Date Value  04/01/2019 2.9 (L)   Phosphorus (mg/dL)  Date Value  04/09/2019 5.4 (H)   Sodium (mmol/L)  Date Value  04/09/2019 142    Assessment: 68 y/o F with acute hypoxic respiratory failure due to aspiration pneumonia in the setting of possible intentional overdose with benzos and alcohol. Patient was extubated 9/16 and vomited. Increased work of breathing on non-rebreather mask and was subsequently re-intubated. Patient on vancomycin for MRSA pneumonia. C. Diff panel is negative. Patient is on SSI.   Patient received 1 dose of Lasix 40 mg IV this AM.   Goal of Therapy:  K ~4, Mg ~2, Phos ~2.5-4.5   Plan:  No electrolyte deficiencies today will defer supplementation.  Re-check BMP, Mg, and Phos tomorrow AM  Sonia Farrell Dear Sonia Farrell, pharmacy student 04/09/2019 1:18 PM

## 2019-04-09 NOTE — Progress Notes (Signed)
Follow up - Critical Care Medicine Note  Patient Details:    Sonia Farrell is an 68 y.o. female  never smoker intubated in ED for decreased LOC and acute hypoxemic respiratory failure.  Patient overdosed intentionally.  CXR c/w PNA.  She was extubated (9/16) but vomited and aspirated and required reintubation.  Sputum consistent with MRSA.  Tried to perform SBT today - patient immediately become hypoxemic and vomited again.  We will stop NG feeds today and place npo to prepare for another weaning trial in am, her pulmonary physiology on ventilator is favorable for liberation otherwise.     Lines, Airways, Drains: Airway 8 mm (Active)  Secured at (cm) 24 cm 04/07/19 1615  Measured From Lips 04/07/19 1615  Secured Location Right 04/07/19 1517  Secured By [employer] 04/07/19 1517  Tube Holder Repositioned Yes 04/07/19 1517  Cuff Pressure (cm H2O) 25 cm H2O 04/07/19 0817  Site Condition Cool;Dry 04/07/19 1517     NG/OG Tube Orogastric 18 Fr. Documented cm marking at nare/ corner of mouth 68 cm (Active)  Cm Marking at Nare/Corner of Mouth (if applicable) 68 cm 04/07/19 1615  Site Assessment Clean;Dry;Intact 04/07/19 1615  Ongoing Placement Verification No change in cm markings or external length of tube from initial placement;No change in respiratory status;No acute changes, not attributed to clinical condition 04/07/19 1615  Status Infusing tube feed 04/07/19 1615  Drainage Appearance Bile;Bloody;Brown 04/14/2019 1725  Intake (mL) 90 mL 04/07/19 1000  Output (mL) 0 mL 04/06/19 2325     Rectal Tube/Pouch (Active)  Output (mL) 400 mL 04/07/19 0345  Intake (mL) 0 mL 04/06/19 2000     Urethral Catheter Kara Knight 14 Fr. (Active)  Indication for Insertion or Continuance of Catheter Therapy based on hourly urine output monitoring and documentation for critical condition (NOT STRICT I&O) 04/07/19 0800  Site Assessment Clean;Intact 04/07/19 0800  Catheter Maintenance Bag below  level of bladder;Insertion date on drainage bag;No dependent loops;Catheter secured;Drainage bag/tubing not touching floor;Seal intact;Bag emptied prior to transport 04/07/19 0800  Collection Container Standard drainage bag 04/07/19 0800  Securement Method Securing device (Describe) 04/07/19 0800  Urinary Catheter Interventions (if applicable) Unclamped 04/07/19 0345  Input (mL) 0 mL 04/06/19 2000  Output (mL) 50 mL 04/07/19 1700    Anti-infectives:  Anti-infectives (From admission, onward)   Start     Dose/Rate Route Frequency Ordered Stop   04/06/19 1800  Ampicillin-Sulbactam (UNASYN) 3 g in sodium chloride 0.9 % 100 mL IVPB     3 g 200 mL/hr over 30 Minutes Intravenous Every 6 hours 04/06/19 1727     04/06/19 0000  vancomycin (VANCOCIN) IVPB 750 mg/150 ml premix     750 mg 150 mL/hr over 60 Minutes Intravenous Every 12 hours 04/05/19 1423     04/05/19 1100  vancomycin (VANCOCIN) 1,500 mg in sodium chloride 0.9 % 500 mL IVPB     1,500 mg 250 mL/hr over 120 Minutes Intravenous  Once 04/05/19 1056 04/05/19 1339   03/22/2019 2200  Ampicillin-Sulbactam (UNASYN) 3 g in sodium chloride 0.9 % 100 mL IVPB  Status:  Discontinued     3 g 200 mL/hr over 30 Minutes Intravenous Every 6 hours 04/06/2019 1915 04/05/19 1056   04/17/2019 1230  cefTRIAXone (ROCEPHIN) 2 g in sodium chloride 0.9 % 100 mL IVPB  Status:  Discontinued     2 g 200 mL/hr over 30 Minutes Intravenous Every 24 hours 04/01/2019 1224 04/04/2019 1915   03/25/2019 1230  azithromycin (ZITHROMAX) 500  mg in sodium chloride 0.9 % 250 mL IVPB  Status:  Discontinued     500 mg 250 mL/hr over 60 Minutes Intravenous Every 24 hours 04-13-2019 1224 2019/04/13 1915      Microbiology: Results for orders placed or performed during the hospital encounter of 2019-04-13  Urine culture     Status: None   Collection Time: 04/13/2019 11:12 AM   Specimen: Urine, Random  Result Value Ref Range Status   Specimen Description   Final    URINE, RANDOM Performed at  [facility], [address] [place], [place] [address]    Special Requests   Final    NONE Performed at [facility], [address], [place], [place] [address]    Culture   Final    NO GROWTH Performed at [facility], [address] [place], [place] [address]    Report Status 04/02/2019 FINAL  Final  Blood Cultures (routine x 2)     Status: None   Collection Time: 04-13-2019 11:38 AM   Specimen: BLOOD  Result Value Ref Range Status   Specimen Description BLOOD BLOOD RIGHT HAND  Final   Special Requests   Final    BOTTLES DRAWN AEROBIC AND ANAEROBIC Blood Culture results may not be optimal due to an excessive volume of blood received in culture bottles   Culture   Final    NO GROWTH 5 DAYS Performed at [facility], [address] [place], [place] [address]    Report Status 04/05/2019 FINAL  Final  SARS Coronavirus 2 [facility] [place] [facility] order, Performed in [facility] hospital lab) Nasopharyngeal     Status: None   Collection Time: 2019-04-13 11:38 AM   Specimen: Nasopharyngeal  Result Value Ref Range Status   SARS Coronavirus 2 NEGATIVE NEGATIVE Final    Comment: (NOTE) If result is NEGATIVE SARS-CoV-2 target nucleic acids are NOT DETECTED. The SARS-CoV-2 RNA is generally detectable in upper and lower  respiratory specimens during the acute phase of infection. The lowest  concentration of SARS-CoV-2 viral copies this assay can detect is 250  copies / mL. A negative result does not preclude SARS-CoV-2 infection  and should not be used as the sole basis for treatment or other  patient management decisions.  A negative result may occur with  improper specimen collection / handling, submission of specimen other  than nasopharyngeal swab, presence of viral mutation(s) within the  areas targeted by this assay, and inadequate number of viral copies  (<250 copies / mL). A negative result must be combined with clinical  observations, patient history, and  epidemiological information. If result is POSITIVE SARS-CoV-2 target nucleic acids are DETECTED. The SARS-CoV-2 RNA is generally detectable in upper and lower  respiratory specimens dur ing the acute phase of infection.  Positive  results are indicative of active infection with SARS-CoV-2.  Clinical  correlation with patient history and other diagnostic information is  necessary to determine patient infection status.  Positive results do  not rule out bacterial infection or co-infection with other viruses. If result is PRESUMPTIVE POSTIVE SARS-CoV-2 nucleic acids MAY BE PRESENT.   A presumptive positive result was obtained on the submitted specimen  and confirmed on repeat testing.  While 2019 novel coronavirus  (SARS-CoV-2) nucleic acids may be present in the submitted sample  additional confirmatory testing may be necessary for epidemiological  and / or clinical management purposes  to differentiate between  SARS-CoV-2 and other Sarbecovirus currently known to infect humans.  If clinically indicated additional testing with an alternate test  methodology [phone]([id number]) is advised. The SARS-CoV-2 RNA is generally  detectable in upper and lower respiratory sp ecimens during the acute  phase of infection. The expected result is Negative. Fact Sheet for Patients:  BoilerBrush.com.cyhttps://www.fda.gov/media/136312/download Fact Sheet for Healthcare Providers: https://pope.com/https://www.fda.gov/media/136313/download This test is not yet approved or cleared by the [place] FDA and has been authorized for detection and/or diagnosis of SARS-CoV-2 by FDA under an Emergency Use Authorization (EUA).  This EUA will remain in effect (meaning this test can be used) for the duration of the COVID-19 declaration under Section 564(b)(1) of the Act, 21 U.S.C. section 360bbb-3(b)(1), unless the authorization is terminated or revoked sooner. Performed at [facility], [address] [place], [place] [address]   Blood  Cultures (routine x 2)     Status: None   Collection Time: 04/13/2019 11:39 AM   Specimen: BLOOD  Result Value Ref Range Status   Specimen Description BLOOD BLOOD RIGHT FOREARM  Final   Special Requests   Final    BOTTLES DRAWN AEROBIC AND ANAEROBIC Blood Culture adequate volume   Culture   Final    NO GROWTH 5 DAYS Performed at [facility], [address] [place], [place] [address]    Report Status 04/05/2019 FINAL  Final  MRSA PCR Screening     Status: None   Collection Time: 03/27/2019  5:46 PM   Specimen: Nasopharyngeal  Result Value Ref Range Status   MRSA by PCR NEGATIVE NEGATIVE Final    Comment:        The GeneXpert MRSA Assay (FDA approved for NASAL specimens only), is one component of a comprehensive MRSA colonization surveillance program. It is not intended to diagnose MRSA infection nor to guide or monitor treatment for MRSA infections. Performed at [facility], [address] [place], [place] [address]   Culture, respiratory     Status: None   Collection Time: 04/02/19  4:50 PM   Specimen: SPU  Result Value Ref Range Status   Specimen Description   Final    SPUTUM Performed at [facility], [address] [place], [place] [address]    Special Requests   Final    NONE Performed at [facility], [address] [place], [place] [address]    Gram Stain   Final    ABUNDANT WBC PRESENT, PREDOMINANTLY PMN NO SQUAMOUS EPITHELIAL CELLS SEEN FEW GRAM POSITIVE COCCI IN CLUSTERS Performed at [facility], [address] [place], [place] [address]    Culture FEW METHICILLIN RESISTANT STAPHYLOCOCCUS AUREUS  Final   Report Status 04/05/2019 FINAL  Final   Organism ID, Bacteria METHICILLIN RESISTANT STAPHYLOCOCCUS AUREUS  Final      Susceptibility   Methicillin resistant staphylococcus aureus - MIC*    CIPROFLOXACIN >=8 RESISTANT Resistant     ERYTHROMYCIN >=8 RESISTANT Resistant     GENTAMICIN <=0.5 SENSITIVE  Sensitive     OXACILLIN >=4 RESISTANT Resistant     TETRACYCLINE <=1 SENSITIVE Sensitive     VANCOMYCIN 1 SENSITIVE Sensitive     TRIMETH/SULFA >=320 RESISTANT Resistant     CLINDAMYCIN <=0.25 SENSITIVE Sensitive     RIFAMPIN <=0.5 SENSITIVE Sensitive     Inducible Clindamycin NEGATIVE Sensitive     * FEW METHICILLIN RESISTANT STAPHYLOCOCCUS AUREUS  C difficile quick scan w PCR reflex     Status: None   Collection Time: 04/04/19 12:54 PM   Specimen: STOOL  Result Value  Ref Range Status   C Diff antigen NEGATIVE NEGATIVE Final   C Diff toxin NEGATIVE NEGATIVE Final   C Diff interpretation No C. difficile detected.  Final    Comment: Performed at [facility], [address] [place], [place] [address]  Culture, respiratory (non-expectorated)     Status: None   Collection Time: 04/06/19  5:27 PM   Specimen: Tracheal Aspirate; Respiratory  Result Value Ref Range Status   Specimen Description   Final    TRACHEAL ASPIRATE Performed at [facility], [address], [place], [place] [address]    Special Requests   Final    NONE Performed at [facility], [address] [place], [place] [address]    Gram Stain   Final    MODERATE WBC PRESENT, PREDOMINANTLY PMN ABUNDANT GRAM POSITIVE COCCI IN CLUSTERS Performed at [facility], [address] [place], [place] [address]    Culture FEW METHICILLIN RESISTANT STAPHYLOCOCCUS AUREUS  Final   Report Status 04/08/2019 FINAL  Final   Organism ID, Bacteria METHICILLIN RESISTANT STAPHYLOCOCCUS AUREUS  Final      Susceptibility   Methicillin resistant staphylococcus aureus - MIC*    CIPROFLOXACIN >=8 RESISTANT Resistant     ERYTHROMYCIN >=8 RESISTANT Resistant     GENTAMICIN <=0.5 SENSITIVE Sensitive     OXACILLIN >=4 RESISTANT Resistant     TETRACYCLINE <=1 SENSITIVE Sensitive     VANCOMYCIN 1 SENSITIVE Sensitive     TRIMETH/SULFA >=320 RESISTANT Resistant     CLINDAMYCIN <=0.25 SENSITIVE Sensitive      RIFAMPIN <=0.5 SENSITIVE Sensitive     Inducible Clindamycin NEGATIVE Sensitive     * FEW METHICILLIN RESISTANT STAPHYLOCOCCUS AUREUS    Best Practice/Protocols:  VTE Prophylaxis: Lovenox (prophylaxtic dose) GI Prophylaxis: Antihistamine Continous Sedation  Events: 9/12 admitted to ICU via ED, intubated and unresponsive 9/12CXR suspicious for right lobe CAP, Lactic acid elevated 9/12Possible seizure noted, CT head showed no intracranial abnormalities 9/13MRI brain showed no abnormalities 9/15Remains minimally responsive and intubated without sedation 9/15EEG showed severe encephalopathy with no seizure activity 9/16Significantly more responsive, following commands 9/16Extubated in ICU. Immediate copious emesis and aspiration occurred 9/16Re-intubated and bronchoscopy performed to clear aspirate +MRSA pneunomonia 9/18 recurrent fever on vancomycin, Unasyn added for potential anaerobes given aspiration, recultured 9/20 final micro pending, continued issues with agitation.  Restart home antidepressant/anxiolytic 9/21 - failed SBT due to hypoxemia and vomiting episode  Studies: Dg Abd 1 View  Result Date: 04/04/2019 CLINICAL DATA:  Nasogastric tube placement. EXAM: ABDOMEN - 1 VIEW COMPARISON:  One view abdomen 03/29/2019. FINDINGS: 1234 hours. Nasogastric tube projects over the mid lumbar spine, consistent with location in the mid stomach. No significant residual gastric distention. There is no dilated small bowel within the visualized upper abdomen. Patchy airspace opacities are present at both lung bases. These are better seen on concurrent chest radiographs. IMPRESSION: Nasogastric tube projects to the level of the mid stomach. Electronically Signed   By: Carey Bullocks M.D.   On: 04/04/2019 12:57   Dg Abdomen 1 View  Result Date: 03/30/2019 CLINICAL DATA:  Unresponsive.  NG tube placement EXAM: ABDOMEN - 1 VIEW COMPARISON:  None. FINDINGS: 1149 hours. Prominent gastric bubble  noted. NG tube tip is in the mid stomach with proximal port of the NG tube below the EG junction. No gaseous bowel distention within the visualized abdomen. Visualized bony anatomy unremarkable. IMPRESSION: NG tube tip is in the mid stomach. Electronically Signed   By: Minerva Areola  Molli Posey M.D.   On: 04/02/2019 12:25   Ct Head Wo Contrast  Result Date: 04/16/2019 CLINICAL DATA:  Altered level of consciousness. EXAM: CT HEAD WITHOUT CONTRAST TECHNIQUE: Contiguous axial images were obtained from the base of the skull through the vertex without intravenous contrast. COMPARISON:  None. FINDINGS: Brain: There is no evidence for acute hemorrhage, hydrocephalus, mass lesion, or abnormal extra-axial fluid collection. No definite CT evidence for acute infarction. Vascular: No hyperdense vessel or unexpected calcification. Skull: No evidence for fracture. No worrisome lytic or sclerotic lesion. Sinuses/Orbits: The visualized paranasal sinuses and mastoid air cells are clear. Visualized portions of the globes and intraorbital fat are unremarkable. Other: None. IMPRESSION: Unremarkable CT evaluation of the brain. No acute intracranial abnormality. Electronically Signed   By: Kennith Center M.D.   On: 04/08/2019 12:46   Mr Brain Wo Contrast  Result Date: 04/01/2019 CLINICAL DATA:  Acute presentation unresponsive. EXAM: MRI HEAD WITHOUT CONTRAST TECHNIQUE: Multiplanar, multiecho pulse sequences of the brain and surrounding structures were obtained without intravenous contrast. COMPARISON:  Head CT 03/27/2019 FINDINGS: Brain: Diffusion imaging does not show any acute or subacute infarction. Brainstem and cerebellum are normal. Cerebral hemispheres show moderate changes of chronic small vessel disease affecting the deep and subcortical white matter. No cortical or large vessel territory infarction. No mass lesion, hemorrhage, hydrocephalus or extra-axial collection. Vascular: Major vessels at the base of the brain show flow. Skull  and upper cervical spine: Negative Sinuses/Orbits: Clear/normal Other: None IMPRESSION: No abnormality seen to explain the clinical presentation. No acute or subacute infarction or any reversible finding. Moderate chronic small-vessel ischemic changes the cerebral hemispheric white matter. Electronically Signed   By: Paulina Fusi M.D.   On: 04/01/2019 14:55   Mr Laqueta Jean WU Contrast  Result Date: 04/03/2019 CLINICAL DATA:  In cephalopathy. EXAM: MRI HEAD WITHOUT AND WITH CONTRAST TECHNIQUE: Multiplanar, multiecho pulse sequences of the brain and surrounding structures were obtained without and with intravenous contrast. CONTRAST:  7mL GADAVIST GADOBUTROL 1 MMOL/ML IV SOLN COMPARISON:  04/01/2019 FINDINGS: Brain: Diffusion imaging does not show any acute or subacute infarction. The brainstem and cerebellum are normal. Cerebral hemispheres show moderate chronic small-vessel ischemic changes of the white matter. No cortical or large vessel territory infarction. No mass lesion, hemorrhage, hydrocephalus or extra-axial collection. Vascular: Major vessels at the base of the brain show flow. Skull and upper cervical spine: Negative Sinuses/Orbits: Clear/normal. Other: Nasopharyngeal fluid related to intubation. IMPRESSION: No change. No acute intracranial finding. Moderate chronic small-vessel ischemic changes of the white matter. Electronically Signed   By: Paulina Fusi M.D.   On: 04/03/2019 18:58   Mr Cervical Spine W Wo Contrast  Result Date: 04/03/2019 CLINICAL DATA:  Myelopathy.  Weakness of the extremities. EXAM: MRI CERVICAL SPINE WITHOUT AND WITH CONTRAST TECHNIQUE: Multiplanar and multiecho pulse sequences of the cervical spine, to include the craniocervical junction and cervicothoracic junction, were obtained without and with intravenous contrast. CONTRAST:  7mL GADAVIST GADOBUTROL 1 MMOL/ML IV SOLN COMPARISON:  None. FINDINGS: Alignment: Normal Vertebrae: No fracture or primary bone lesion. Cord: No  primary cord lesion.  See below regarding stenosis. Posterior Fossa, vertebral arteries, paraspinal tissues: Negative Disc levels: No abnormality at foramen magnum, C1-2 or C2-3. C3-4: Uncovertebral hypertrophy and facet arthropathy on the left. Left foraminal stenosis could affect the C4 nerve. No central canal stenosis. C4-5: Endplate osteophytes and mild bulging of the disc. Facet arthropathy on the left. Mild left foraminal narrowing. C5-6: Broad-based, right posterolateral predominant disc herniation effaces the  ventral subarachnoid space and indents the right side of the cord. Proximal foraminal encroachment on the right. Facet arthropathy on the right. Right foraminal narrowing could affect the C6 nerve. C6-7: Spondylosis with endplate osteophytes and shallow protrusion of the disc. Narrowing the ventral subarachnoid space but no compression of the cord. Bilateral foraminal narrowing. C7-T1: No disc pathology.  Mild facet hypertrophy. T1-2: Normal. No significant enhancing pathology. IMPRESSION: No primary cord pathology. C3-4: Left foraminal stenosis due to encroachment by uncovertebral osteophytes and facet arthropathy could affect the left C4 nerve. C4-5: Mild left foraminal narrowing due to uncovertebral prominence and facet degeneration, without definite compressive stenosis. C5-6: Broad-based, right posterolateral predominant disc herniation with effacement of the ventral subarachnoid space and slight deformity of the right side of the cord. Facet arthropathy on the right. Right foraminal stenosis could affect the right C6 nerve. C6-7: Chronic degenerative spondylosis. Narrowing of the ventral subarachnoid space but no compression of the cord. Bilateral foraminal narrowing could affect either C7 nerve. Electronically Signed   By: Nelson Chimes M.D.   On: 04/03/2019 19:02   Dg Chest Port 1 View  Result Date: 04/07/2019 CLINICAL DATA:  Acute respiratory distress.  Follow-up exam. EXAM: PORTABLE CHEST 1  VIEW COMPARISON:  04/05/2019 and earlier exams. FINDINGS: Right upper lobe airspace consolidation is significantly improved, particularly compared to 04/04/2019. Lungs show bilateral interstitial prominence with patchy area airspace opacities more diffusely, the latter also improved the previous exam. No new lung abnormalities. No convincing pleural effusion and no pneumothorax. Endotracheal tube and nasal/orogastric tube are stable and well positioned. IMPRESSION: 1. Improved lung aeration compared to most recent prior exams. This is most evident in right upper lobe with decreased airspace lung opacity. 2. No new abnormalities.  Stable support apparatus. Electronically Signed   By: Lajean Manes M.D.   On: 04/07/2019 05:58   Dg Chest Port 1 View  Result Date: 04/05/2019 CLINICAL DATA:  Acute respiratory failure EXAM: PORTABLE CHEST 1 VIEW COMPARISON:  04/04/2019 FINDINGS: Endotracheal tube and gastric catheter are again seen and stable. Cardiac shadow is stable. The lungs are well aerated bilaterally with patchy bilateral airspace opacities similar to that seen on the prior exam. No new focal infiltrate is seen. No sizable effusion is noted. IMPRESSION: Multifocal pneumonia stable from prior exam. Electronically Signed   By: Inez Catalina M.D.   On: 04/05/2019 03:52   Dg Chest Port 1 View  Result Date: 04/04/2019 CLINICAL DATA:  Multilobar pneumonia.  Intubated patient. EXAM: PORTABLE CHEST 1 VIEW COMPARISON:  Radiographs 04/03/2019 and 04/02/2019. FINDINGS: 1233 hours. Mild patient rotation to the right. Tip of the endotracheal tube is in the mid trachea. Enteric tube projects below the diaphragm, tip not visualized. The heart size and mediastinal contours are stable. Right upper lobe airspace disease appears unchanged. There is slight worsening of the bibasilar airspace opacities. There are possible small bilateral pleural effusions. No pneumothorax. The bones appear unchanged. IMPRESSION: 1. Stable  position of the endotracheal and nasogastric tubes. 2. Worsening basilar components of bilateral airspace opacities consistent with multilobar pneumonia. Possible small bilateral pleural effusions. Electronically Signed   By: Richardean Sale M.D.   On: 04/04/2019 12:59   Dg Chest Port 1 View  Result Date: 04/03/2019 CLINICAL DATA:  Evaluate for pneumonia. EXAM: PORTABLE CHEST 1 VIEW COMPARISON:  Chest radiograph 04/02/2019, 04/01/2019 FINDINGS: Stable cardiomediastinal contours. The endotracheal tip terminates between the thoracic inlet and carina. Nasogastric tube courses below the diaphragm with distal aspect out of field of view.  No pneumothorax or pleural effusion. There are persistent diffuse right-sided heterogeneous pulmonary opacities. The left lung is clear. Visualized bony skeleton is unremarkable. IMPRESSION: Persistent diffuse right-sided heterogeneous pulmonary opacities suspicious for infection. Electronically Signed   By: Emmaline Kluver M.D.   On: 04/03/2019 15:36   Dg Chest Port 1 View  Result Date: 04/02/2019 CLINICAL DATA:  Respiratory failure. EXAM: PORTABLE CHEST 1 VIEW COMPARISON:  Radiograph of April 01, 2019. FINDINGS: Endotracheal and nasogastric tubes are unchanged in position. No pneumothorax is noted. No significant pleural effusion is noted. Left lung is clear. Stable right upper and lower lobe airspace opacities are noted concerning for pneumonia. Bony thorax is unremarkable. IMPRESSION: Stable right lung opacities are noted concerning for pneumonia. Stable support apparatus. Electronically Signed   By: Lupita Raider M.D.   On: 04/02/2019 07:46   Dg Chest Port 1 View  Result Date: 04/01/2019 CLINICAL DATA:  68 year old female with history of respiratory failure. EXAM: PORTABLE CHEST 1 VIEW COMPARISON:  Chest x-ray 2019-04-21. FINDINGS: An endotracheal tube is in place with tip 5.1 cm above the carina. A nasogastric tube is seen extending into the stomach, however,  the tip of the nasogastric tube extends below the lower margin of the image. Patchy multifocal interstitial and airspace disease noted throughout the right lung, most confluent in the right upper lobe, with slightly worsened aeration compared to yesterday's examination. Left lung is clear. No pleural effusions. No evidence of pulmonary edema. Heart size is normal. Upper mediastinal contours are within normal limits. IMPRESSION: 1. Severe multilobar pneumonia in the right lung, most pronounced in the right upper lobe, with worsening aeration compared to yesterday's examination. 2. Support apparatus, as above. Electronically Signed   By: Trudie Reed M.D.   On: 04/01/2019 08:03   Dg Chest Port 1 View  Result Date: April 21, 2019 CLINICAL DATA:  Status post intubation and OG tube placement. EXAM: PORTABLE CHEST 1 VIEW COMPARISON:  No comparison studies available. FINDINGS: 1147 hours. Endotracheal tube tip is 5.2 cm above the base of the carina. The NG tube passes into the stomach although the distal tip position is not included on the film. Volume loss noted right hemithorax patchy airspace disease noted right upper lobe and to a lesser degree in the right mid lung. Left lung appears hyperexpanded but clear. Interstitial markings are diffusely coarsened with chronic features. The cardiopericardial silhouette is within normal limits for size. IMPRESSION: Volume loss right hemithorax with right upper and mid lung patchy airspace disease. Endotracheal tube and NG tube as above. Electronically Signed   By: Kennith Center M.D.   On: April 21, 2019 12:23    Consults: Treatment Team:  Kym Groom, MD   Subjective:    Overnight Issues: Overnight became more agitated and required propofol.  Has been the same today did not tolerate SBT.  Note that she had been on Xanax as an outpatient and Paxil.  Objective:  Vital signs for last 24 hours: Temp:  [98.7 F (37.1 C)-99.4 F (37.4 C)] 99 F (37.2 C) (09/21  0400) Pulse Rate:  [67-115] 81 (09/21 0600) Resp:  [18-24] 20 (09/21 0600) BP: (102-178)/(48-64) 111/56 (09/21 0600) SpO2:  [87 %-97 %] 87 % (09/21 0600) FiO2 (%):  [40 %] 40 % (09/21 0400)  Hemodynamic parameters for last 24 hours:    Intake/Output from previous day: 09/20 0701 - 09/21 0700 In: 1532 [I.V.:932.2; IV Piggyback:599.7] Out: 1165 [Urine:1115; Stool:50]  Intake/Output this shift: No intake/output data recorded.  Vent settings for last 24  hours: Vent Mode: PRVC FiO2 (%):  [40 %] 40 % Set Rate:  [0 bmp-20 bmp] 20 bmp Vt Set:  [450 mL] 450 mL PEEP:  [5 cmH20] 5 cmH20 Plateau Pressure:  [15 cmH20-18 cmH20] 18 cmH20  Physical Exam:  Gen: Intubated, RASS -1 to -2, not F/C consistently, fidgeting in bed, agitated at times HEENT: NCAT, sclerae anicteric Neck: No LAN, no JVD noted, trachea midline Lungs: Scattered rhonchi, no wheezes.  Synchronous with vent unless agitated Cardiovascular: Regular, no M noted Abdomen: Soft, NT, +BS Ext: Warm, no edema Neuro: No overt deficit noted, she moves all 4 spontaneously.  Fidgety, not following commands reliably, impulsive Skin: No lesions noted  Assessment/Plan:  IMPRESSION: Acute hypoxemic respiratory failure RUL community-acquired pneumonia followed by aspiration pneumonia Severe sepsis, resolving Sinus tachycardia, resolved Altered mental status, now with some agitation query ICU delirium versus withdrawal from benzos Concern for seizure, ruled out, Keppra discontinued.  PLAN/REC: Cont vent support - settings reviewed and adjusted Cont vent bundle Daily SBT if/when meets criteria, failed SBT again this morning Monitor temp, WBC count Micro and abx: Most recent culture pending, initial cult. MRSA, on vancomycin and Unasyn Initiated TF protocol 09/13 DVT px: enoxaparin Monitor CBC intermittently Transfuse per usual guidelines Sedation protocol: Precedex, fentanyl.  Propofol had to be started last night due to  agitation RASS goal -1 Restart Paxil, Klonopin twice a day avoid Xanax.    LOS: 9 days   Additional comments: Care coordination with bedside nurse performed today.  Discussed with RT as well.  Critical Care Total Time*: 40 Minutes    Vida RiggerFuad Glendon Fiser, M.D.  Pulmonary & Critical Care Medicine  [facility]   *Care during the described time interval was provided by me and/or other providers on the critical care team.  I have reviewed this patient's available data, including medical history, events of note, physical examination and test results as part of my evaluation.

## 2019-04-09 NOTE — Consult Note (Signed)
Pharmacy Antibiotic Note  Sonia Farrell is a 68 y.o. female admitted on 04/17/2019 with aspiration pneumonia. CXR significant for severe multilobar pneumonia in right lung. Patient was extubated 9/16 and vomited. Increased work of breathing on non-rebreather mask and was subsequently re-intubated. Patient has finished therapy with Unasyn (stopped on 9/18 AM, but then restarted later that night) for aspiration. WBC trending up but patient afebrile. CXR noted stable multifocal pneumonia with cultures growing MRSA. Pharmacy has been consulted for vancomycin dosing.   9/20 1427 peak = 26 9/20 2310 trough = 12 Steady state AUC at current dose = 457.1  Plan: Per AM rounds, Unasyn was discontinued.   Continue vancomycin 750 mg IV Q12H. Today is Day 5 -- anticipate therapy for 7 days.   Continue to monitor labs, vitals, cultures for signs of clinical improvement with antibiotic therapy.   Height: 5\' 7"  (170.2 cm) Weight: 166 lb 0.1 oz (75.3 kg) IBW/kg (Calculated) : 61.6  Temp (24hrs), Avg:98.8 F (37.1 C), Min:98.7 F (37.1 C), Max:99 F (37.2 C)  Recent Labs  Lab 04/04/19 0534 04/05/19 0452 04/06/19 0555 04/07/19 0447 04/08/19 0500 04/08/19 1427 04/08/19 2310 04/09/19 0758  WBC 11.6* 9.5 11.8* 14.5* 19.4*  --   --   --   CREATININE 0.69 0.79 0.76 0.70 0.62  --   --  0.65  VANCOTROUGH  --   --   --   --   --   --  12*  --   VANCOPEAK  --   --   --   --   --  26*  --   --     Estimated Creatinine Clearance: 71.3 mL/min (by C-G formula based on SCr of 0.65 mg/dL).    Allergies  Allergen Reactions  . Escitalopram Oxalate Other (See Comments)    MUSCLE SPASMS    Antimicrobials this admission: 9/12 Azithromycin and Ceftriaxone x 1 9/12 Unasyn 9/12 >> 9/21   9/17 Vancomycin >>   Dose adjustments this admission: None  Microbiology results: 9/18 tracheal aspirate cx: MRSA (sensitive to gentamicin, tetracycline, vancomycin, clindamycin, rifampin) 9/16 C diff: negative   9/14 sputum cx: MRSA 9/12 BCx: no growth  9/12 UCx: no growth 9/12 MRSA PCR: negative  9/12 COVID: negative   Thank you for allowing pharmacy to be a part of this patient's care.  Vianney Kopecky Dear [name], pharmacy student 04/09/2019 1:05 PM

## 2019-04-09 NOTE — Progress Notes (Addendum)
[employer] [facility] [place] at [facility]   PATIENT NAME: Sonia Farrell    MR#:  [id number]  DATE OF BIRTH:  [birth year]  SUBJECTIVE:  CHIEF COMPLAINT:   Chief Complaint  Patient presents with  . Unresponsive   -Patient intubated for airway protection in the ED, came in after likely intentional overdose. -Failing weaning trials, episode of aspiration while trying to extubate a few days ago.  Still has increased oral secretions. -Intubated and sedated  REVIEW OF SYSTEMS:  Review of Systems  Unable to perform ROS: Critical illness    DRUG ALLERGIES:   Allergies  Allergen Reactions  . Escitalopram Oxalate Other (See Comments)    MUSCLE SPASMS    VITALS:  Blood pressure (!) 103/52, pulse 88, temperature 98.9 F (37.2 C), temperature source Axillary, resp. rate 18, height 5\' 7"  (1.702 m), weight 75.3 kg, SpO2 93 %.  PHYSICAL EXAMINATION:  Physical Exam   GENERAL:  68 y.o.-year-old patient lying in the bed, critically ill-appearing EYES: Pupils equal, round, reactive to light and accommodation. No scleral icterus. Extraocular muscles intact.  HEENT: Head atraumatic, normocephalic.  Orally intubated, has a OG tube.  Increased oral secretions noted. NECK:  Supple, no jugular venous distention. No thyroid enlargement, no tenderness.  LUNGS: Gurgling upper airway sounds heard.  Normal breath sounds bilaterally, no wheezing, rales,rhonchi or crepitation. No use of accessory muscles of respiration.  Creased bibasilar breath sounds CARDIOVASCULAR: S1, S2 normal. No murmurs, rubs, or gallops.  ABDOMEN: Soft, nontender, nondistended. Bowel sounds present. No organomegaly or mass.  EXTREMITIES: No pedal edema, cyanosis, or clubbing.  NEUROLOGIC: Sedated, opening eyes to palpation, not tracking or following commands..  Localizing pain. PSYCHIATRIC: The patient is sedated  SKIN: No obvious rash, lesion, or ulcer.    LABORATORY PANEL:   CBC Recent Labs  Lab 04/08/19  0500  WBC 19.4*  HGB 11.5*  HCT 37.3  PLT 420*   ------------------------------------------------------------------------------------------------------------------  Chemistries  Recent Labs  Lab 04/09/19 0758  NA 142  K 4.5  CL 103  CO2 30  GLUCOSE 162*  BUN 28*  CREATININE 0.65  CALCIUM 8.8*  MG 2.5*   ------------------------------------------------------------------------------------------------------------------  Cardiac Enzymes No results for input(s): TROPONINI in the last 168 hours. ------------------------------------------------------------------------------------------------------------------  RADIOLOGY:  Dg Chest Port 1 View  Result Date: 04/09/2019 CLINICAL DATA:  68 year old female with respiratory failure EXAM: PORTABLE CHEST 1 VIEW COMPARISON:  April 07, 2019, April 05, 2019 FINDINGS: Cardiomediastinal silhouette unchanged in size and contour. Unchanged endotracheal tube which terminates approximately 2.9 cm above the carina. Unchanged gastric tube terminating out of the field of view. Reticulonodular opacities of the bilateral lungs, improving from the plain film dated April 04, 2019 no pneumothorax. No pleural effusion. IMPRESSION: Bilateral reticulonodular opacities persist, improved dating to the plain films of April 04, 2019 Unchanged endotracheal tube and gastric tube Electronically Signed   By: April 06, 2019 D.O.   On: 04/09/2019 08:01    EKG:   Orders placed or performed during the hospital encounter of 04/02/2019  . ED EKG  . ED EKG  . EKG 12-Lead  . EKG 12-Lead  . EKG    ASSESSMENT AND PLAN:   68 year old female, no history of smoking but has history of hyperlipidemia, hypothyroidism whose husband passed away recently, was brought in for unresponsiveness and intubated for airway protection.  1.  Acute hypoxic respiratory failure-secondary to aspiration pneumonia -Initially patient intubated for airway protection as there is  concern for intentional overdose with benzos -While trying  to extubate on 04/04/2019-she had to be reintubated secondary to aspiration pneumonia.  Bronchoscopy was performed to clear the aspirate. -Started on a weaning trial this morning and SBT, had another episode of near aspiration. -Patient will be kept n.p.o. today for another weaning trial tomorrow -Appreciate pulmonary consult  2.  MRSA pneumonia and aspiration pneumonia-sepsis secondary to the same -Continue antibiotics -Currently only on vancomycin.  3.  Acute toxic metabolic encephalopathy-secondary to benzo overdose as benzo bottle was found near her. -MRI of the brain negative.  Repeat MRI of the brain and C-spine are unremarkable except for disc herniation. -EEG with moderate to severe diffuse encephalopathy. -Off Keppra. -Currently on fentanyl and propofol.    4.  GI prophylaxis-Protonix  5.  DVT prophylaxis-Lovenox     All the records are reviewed and case discussed with Care Management/Social Workerr. Management plans discussed with the patient, family and they are in agreement.  CODE STATUS: Full code  TOTAL TIME TAKING CARE OF THIS PATIENT: 39 minutes.   POSSIBLE D/C IN ? DAYS, DEPENDING ON CLINICAL CONDITION.   Gladstone Lighter M.D on 04/09/2019 at 1:31 PM  Between 7am to 6pm - Pager - [phone]  After 6pm go to [email or web address] - password [email or web address] [facility]  Office  [phone]  CC: Primary care physician; [place] Pink, MD

## 2019-04-10 LAB — CBC WITH DIFFERENTIAL/PLATELET
Abs Immature Granulocytes: 0.24 10*3/uL — ABNORMAL HIGH (ref 0.00–0.07)
Basophils Absolute: 0 10*3/uL (ref 0.0–0.1)
Basophils Relative: 0 %
Eosinophils Absolute: 0.1 10*3/uL (ref 0.0–0.5)
Eosinophils Relative: 1 %
HCT: 30.1 % — ABNORMAL LOW (ref 36.0–46.0)
Hemoglobin: 9.3 g/dL — ABNORMAL LOW (ref 12.0–15.0)
Immature Granulocytes: 2 %
Lymphocytes Relative: 12 %
Lymphs Abs: 1.7 10*3/uL (ref 0.7–4.0)
MCH: 30 pg (ref 26.0–34.0)
MCHC: 30.9 g/dL (ref 30.0–36.0)
MCV: 97.1 fL (ref 80.0–100.0)
Monocytes Absolute: 0.7 10*3/uL (ref 0.1–1.0)
Monocytes Relative: 5 %
Neutro Abs: 11.6 10*3/uL — ABNORMAL HIGH (ref 1.7–7.7)
Neutrophils Relative %: 80 %
Platelets: 401 10*3/uL — ABNORMAL HIGH (ref 150–400)
RBC: 3.1 MIL/uL — ABNORMAL LOW (ref 3.87–5.11)
RDW: 13.2 % (ref 11.5–15.5)
WBC: 14.3 10*3/uL — ABNORMAL HIGH (ref 4.0–10.5)
nRBC: 0 % (ref 0.0–0.2)

## 2019-04-10 LAB — TRIGLYCERIDES: Triglycerides: 404 mg/dL — ABNORMAL HIGH (ref <150)

## 2019-04-10 LAB — GLUCOSE, CAPILLARY
Glucose-Capillary: 78 mg/dL (ref 70–99)
Glucose-Capillary: 82 mg/dL (ref 70–99)
Glucose-Capillary: 83 mg/dL (ref 70–99)
Glucose-Capillary: 84 mg/dL (ref 70–99)
Glucose-Capillary: 88 mg/dL (ref 70–99)
Glucose-Capillary: 96 mg/dL (ref 70–99)

## 2019-04-10 LAB — BASIC METABOLIC PANEL
Anion gap: 6 (ref 5–15)
BUN: 23 mg/dL (ref 8–23)
CO2: 37 mmol/L — ABNORMAL HIGH (ref 22–32)
Calcium: 8.1 mg/dL — ABNORMAL LOW (ref 8.9–10.3)
Chloride: 101 mmol/L (ref 98–111)
Creatinine, Ser: 0.6 mg/dL (ref 0.44–1.00)
GFR calc Af Amer: >60 mL/min (ref >60)
GFR calc non Af Amer: >60 mL/min (ref >60)
Glucose, Bld: 98 mg/dL (ref 70–99)
Potassium: 3.4 mmol/L — ABNORMAL LOW (ref 3.5–5.1)
Sodium: 144 mmol/L (ref 135–145)

## 2019-04-10 MED ORDER — MIDAZOLAM 50MG/50ML (1MG/ML) PREMIX INFUSION
0.0000 mg/h | INTRAVENOUS | Status: DC
  Filled 2019-04-10: qty 50

## 2019-04-10 MED ORDER — DEXMEDETOMIDINE HCL IN NACL 400 MCG/100ML IV SOLN
0.2000 ug/kg/h | INTRAVENOUS | Status: DC
  Administered 2019-04-10: 0.4 ug/kg/h via INTRAVENOUS
  Administered 2019-04-11: 04:00:00 0.7 ug/kg/h via INTRAVENOUS
  Administered 2019-04-11: 12:00:00 0.6 ug/kg/h via INTRAVENOUS
  Filled 2019-04-10 (×3): qty 100

## 2019-04-10 MED ORDER — LEVOTHYROXINE SODIUM 50 MCG PO TABS
75.0000 ug | ORAL_TABLET | Freq: Every day | ORAL | Status: DC
  Administered 2019-04-10 – 2019-04-11 (×2): 75 ug via ORAL
  Filled 2019-04-10 (×2): qty 2

## 2019-04-10 MED ORDER — POTASSIUM CHLORIDE 20 MEQ/15ML (10%) PO SOLN
20.0000 meq | ORAL | Status: AC
  Administered 2019-04-10 (×2): 20 meq
  Filled 2019-04-10 (×2): qty 15

## 2019-04-10 MED ORDER — SODIUM CHLORIDE 0.9 % IV SOLN
250.0000 mg | Freq: Three times a day (TID) | INTRAVENOUS | Status: DC
  Administered 2019-04-10 – 2019-04-11 (×4): 250 mg via INTRAVENOUS
  Filled 2019-04-10 (×6): qty 5

## 2019-04-10 MED ORDER — DEXTROSE 5 % IV SOLN
INTRAVENOUS | Status: DC
  Administered 2019-04-10: 11:00:00 via INTRAVENOUS
  Administered 2019-04-11: 11:00:00 40 mL/h via INTRAVENOUS

## 2019-04-10 MED ORDER — MIDAZOLAM HCL-SODIUM CHLORIDE 50-0.8 MG/50ML-% IV SOLN: 0.0000 mg | INTRAVENOUS | Status: DC

## 2019-04-10 MED ORDER — SODIUM CHLORIDE 0.9 % IV SOLN
0.0000 mg/h | INTRAVENOUS | Status: DC
  Administered 2019-04-11: 1 mg/h via INTRAVENOUS
  Filled 2019-04-10: qty 10

## 2019-04-10 MED ORDER — MIDAZOLAM HCL 2 MG/2ML IJ SOLN
2.0000 mg | Freq: Once | INTRAMUSCULAR | Status: AC
  Administered 2019-04-10: 02:00:00 2 mg via INTRAVENOUS
  Filled 2019-04-10: qty 2

## 2019-04-10 NOTE — Progress Notes (Signed)
[place] [facility] [place] NAME: Sonia Farrell    MR#:  [id number]  DATE OF BIRTH:  11/25/1950  SUBJECTIVE:  CHIEF COMPLAINT:   Chief Complaint  Patient presents with  . Unresponsive   -Patient intubated for airway protection in the ED, came in after likely intentional overdose. -Failing weaning trials.  Failed extubation on 04/04/19 due to aspiration.  Reintubated. -Remains on sedation, opening eyes but not tracking or following commands. -Tube feeds on hold for SBT today  REVIEW OF SYSTEMS:  Review of Systems  Unable to perform ROS: Critical illness    DRUG ALLERGIES:   Allergies  Allergen Reactions  . Escitalopram Oxalate Other (See Comments)    MUSCLE SPASMS    VITALS:  Blood pressure (!) 111/43, pulse 79, temperature 98.3 F (36.8 C), resp. rate (!) 21, height 5\' 7"  (1.702 m), weight 75.3 kg, SpO2 93 %.  PHYSICAL EXAMINATION:  Physical Exam   GENERAL:  68 y.o.-year-old patient lying in the bed, critically ill-appearing EYES: Pupils equal, round, reactive to light and accommodation. No scleral icterus. Extraocular muscles intact.  HEENT: Head atraumatic, normocephalic.  Orally intubated, has a OG tube.  Increased oral secretions noted. NECK:  Supple, no jugular venous distention. No thyroid enlargement, no tenderness.  LUNGS:  Normal breath sounds bilaterally, no wheezing, rales,rhonchi or crepitation. No use of accessory muscles of respiration.  Decreased bibasilar breath sounds CARDIOVASCULAR: S1, S2 normal. No murmurs, rubs, or gallops.  ABDOMEN: Soft, nontender, nondistended. Bowel sounds present. No organomegaly or mass.  EXTREMITIES: No pedal edema, cyanosis, or clubbing.  NEUROLOGIC: Sedated, opening eyes to palpation, not tracking or following commands..  Localizing pain. PSYCHIATRIC: The patient is sedated  SKIN: No obvious rash, lesion, or ulcer.    LABORATORY PANEL:   CBC Recent Labs  Lab 04/10/19 0509   WBC 14.3*  HGB 9.3*  HCT 30.1*  PLT 401*   ------------------------------------------------------------------------------------------------------------------  Chemistries  Recent Labs  Lab 04/09/19 0758 04/10/19 0509  NA 142 144  K 4.5 3.4*  CL 103 101  CO2 30 37*  GLUCOSE 162* 98  BUN 28* 23  CREATININE 0.65 0.60  CALCIUM 8.8* 8.1*  MG 2.5*  --    ------------------------------------------------------------------------------------------------------------------  Cardiac Enzymes No results for input(s): TROPONINI in the last 168 hours. ------------------------------------------------------------------------------------------------------------------  RADIOLOGY:  Dg Chest Port 1 View  Result Date: 04/09/2019 CLINICAL DATA:  68 year old female with respiratory failure EXAM: PORTABLE CHEST 1 VIEW COMPARISON:  April 07, 2019, April 05, 2019 FINDINGS: Cardiomediastinal silhouette unchanged in size and contour. Unchanged endotracheal tube which terminates approximately 2.9 cm above the carina. Unchanged gastric tube terminating out of the field of view. Reticulonodular opacities of the bilateral lungs, improving from the plain film dated April 04, 2019 no pneumothorax. No pleural effusion. IMPRESSION: Bilateral reticulonodular opacities persist, improved dating to the plain films of April 04, 2019 Unchanged endotracheal tube and gastric tube Electronically Signed   By: Corrie Mckusick D.O.   On: 04/09/2019 08:01    EKG:   Orders placed or performed during the hospital encounter of 04/03/2019  . ED EKG  . ED EKG  . EKG 12-Lead  . EKG 12-Lead  . EKG    ASSESSMENT AND PLAN:   68 year old female, no history of smoking but has history of hyperlipidemia, hypothyroidism whose husband passed away recently, was brought in for unresponsiveness and intubated for airway protection.  1.  Acute hypoxic respiratory failure-secondary to aspiration pneumonia -Initially patient  intubated  for airway protection as there is concern for intentional overdose with benzos -While trying to extubate on 04/04/2019-she had to be reintubated secondary to aspiration pneumonia.  Bronchoscopy was performed to clear the aspirate. -unable to tolerate SBT,vomited yesterday. -tube feeds ob hold for SBT today -Appreciate pulmonary consult  2.  MRSA pneumonia and aspiration pneumonia-sepsis secondary to the same -Continue antibiotics -Currently only on vancomycin.  3.  Acute toxic metabolic encephalopathy-secondary to benzo overdose as benzo bottle was found near her. -MRI of the brain negative.  Repeat MRI of the brain and C-spine are unremarkable except for disc herniation. -EEG with moderate to severe diffuse encephalopathy. -Off Keppra. -Currently on fentanyl and propofol.    4.  GI prophylaxis-Protonix  5.  DVT prophylaxis-Lovenox  Overall poor prognosis   All the records are reviewed and case discussed with Care Management/Social Workerr. Management plans discussed with the patient, family and they are in agreement.  CODE STATUS: Full code  TOTAL TIME TAKING CARE OF THIS PATIENT: 39 minutes.   POSSIBLE D/C IN ? DAYS, DEPENDING ON CLINICAL CONDITION.   Enid Baas M.D on 04/10/2019 at 9:06 AM  Between 7am to 6pm - Pager - [phone]  After 6pm go to [email or web address] - Social research officer, government  [facility] [place] [facility] [facility]  Office  [phone]  CC: Primary care physician; Jerl Mina, MD

## 2019-04-10 NOTE — Progress Notes (Signed)
Follow up - Critical Care Medicine Note  Patient Details:    Sonia Farrell is an 68 y.o. female  never smoker intubated in ED for decreased LOC and acute hypoxemic respiratory failure.  Patient overdosed intentionally.  CXR c/w PNA.  She was extubated (9/16) but vomited and aspirated and required reintubation.  Sputum consistent with MRSA.  Plan for repeat SBT today. Patient failed weaning trial yesterday.    Lines, Airways, Drains: Airway 8 mm (Active)  Secured at (cm) 24 cm 04/07/19 1615  Measured From Lips 04/07/19 1615  Secured Location Right 04/07/19 1517  Secured By [employer] 04/07/19 1517  Tube Holder Repositioned Yes 04/07/19 1517  Cuff Pressure (cm H2O) 25 cm H2O 04/07/19 0817  Site Condition Cool;Dry 04/07/19 1517     NG/OG Tube Orogastric 18 Fr. Documented cm marking at nare/ corner of mouth 68 cm (Active)  Cm Marking at Nare/Corner of Mouth (if applicable) 68 cm 04/07/19 1615  Site Assessment Clean;Dry;Intact 04/07/19 1615  Ongoing Placement Verification No change in cm markings or external length of tube from initial placement;No change in respiratory status;No acute changes, not attributed to clinical condition 04/07/19 1615  Status Infusing tube feed 04/07/19 1615  Drainage Appearance Bile;Bloody;Brown 2019-04-07 1725  Intake (mL) 90 mL 04/07/19 1000  Output (mL) 0 mL 04/06/19 2325     Rectal Tube/Pouch (Active)  Output (mL) 400 mL 04/07/19 0345  Intake (mL) 0 mL 04/06/19 2000     Urethral Catheter Kara Knight 14 Fr. (Active)  Indication for Insertion or Continuance of Catheter Therapy based on hourly urine output monitoring and documentation for critical condition (NOT STRICT I&O) 04/07/19 0800  Site Assessment Clean;Intact 04/07/19 0800  Catheter Maintenance Bag below level of bladder;Insertion date on drainage bag;No dependent loops;Catheter secured;Drainage bag/tubing not touching floor;Seal intact;Bag emptied prior to transport 04/07/19 0800   Collection Container Standard drainage bag 04/07/19 0800  Securement Method Securing device (Describe) 04/07/19 0800  Urinary Catheter Interventions (if applicable) Unclamped 04/07/19 0345  Input (mL) 0 mL 04/06/19 2000  Output (mL) 50 mL 04/07/19 1700    Anti-infectives:  Anti-infectives (From admission, onward)   Start     Dose/Rate Route Frequency Ordered Stop   04/10/19 1400  erythromycin 250 mg in sodium chloride 0.9 % 100 mL IVPB     250 mg 100 mL/hr over 60 Minutes Intravenous Every 8 hours 04/10/19 1057 04/13/19 1359   04/06/19 1800  Ampicillin-Sulbactam (UNASYN) 3 g in sodium chloride 0.9 % 100 mL IVPB  Status:  Discontinued     3 g 200 mL/hr over 30 Minutes Intravenous Every 6 hours 04/06/19 1727 04/09/19 1032   04/06/19 0000  vancomycin (VANCOCIN) IVPB 750 mg/150 ml premix     750 mg 150 mL/hr over 60 Minutes Intravenous Every 12 hours 04/05/19 1423     04/05/19 1100  vancomycin (VANCOCIN) 1,500 mg in sodium chloride 0.9 % 500 mL IVPB     1,500 mg 250 mL/hr over 120 Minutes Intravenous  Once 04/05/19 1056 04/05/19 1339   04-07-2019 2200  Ampicillin-Sulbactam (UNASYN) 3 g in sodium chloride 0.9 % 100 mL IVPB  Status:  Discontinued     3 g 200 mL/hr over 30 Minutes Intravenous Every 6 hours April 07, 2019 1915 04/05/19 1056   April 07, 2019 1230  cefTRIAXone (ROCEPHIN) 2 g in sodium chloride 0.9 % 100 mL IVPB  Status:  Discontinued     2 g 200 mL/hr over 30 Minutes Intravenous Every 24 hours 04-07-19 1224 2019/04/07 1915  04/02/2019 1230  azithromycin (ZITHROMAX) 500 mg in sodium chloride 0.9 % 250 mL IVPB  Status:  Discontinued     500 mg 250 mL/hr over 60 Minutes Intravenous Every 24 hours 03/26/2019 1224 04/05/2019 1915      Microbiology: Results for orders placed or performed during the hospital encounter of 03/28/2019  Urine culture     Status: None   Collection Time: 04/17/2019 11:12 AM   Specimen: Urine, Random  Result Value Ref Range Status   Specimen Description   Final    URINE,  RANDOM Performed at [facility], [address], [place], [place] [address]    Special Requests   Final    NONE Performed at [facility], [address] [place], [place] [address]    Culture   Final    NO GROWTH Performed at [facility], [address], [place], [place] [address]    Report Status 04/02/2019 FINAL  Final  Blood Cultures (routine x 2)     Status: None   Collection Time: 03/30/2019 11:38 AM   Specimen: BLOOD  Result Value Ref Range Status   Specimen Description BLOOD BLOOD RIGHT HAND  Final   Special Requests   Final    BOTTLES DRAWN AEROBIC AND ANAEROBIC Blood Culture results may not be optimal due to an excessive volume of blood received in culture bottles   Culture   Final    NO GROWTH 5 DAYS Performed at [facility], [address] [place], [place] [address]    Report Status 04/05/2019 FINAL  Final  SARS Coronavirus 2 [facility] order, Performed in [facility] hospital lab) Nasopharyngeal     Status: None   Collection Time: 03/21/2019 11:38 AM   Specimen: Nasopharyngeal  Result Value Ref Range Status   SARS Coronavirus 2 NEGATIVE NEGATIVE Final    Comment: (NOTE) If result is NEGATIVE SARS-CoV-2 target nucleic acids are NOT DETECTED. The SARS-CoV-2 RNA is generally detectable in upper and lower  respiratory specimens during the acute phase of infection. The lowest  concentration of SARS-CoV-2 viral copies this assay can detect is 250  copies / mL. A negative result does not preclude SARS-CoV-2 infection  and should not be used as the sole basis for treatment or other  patient management decisions.  A negative result may occur with  improper specimen collection / handling, submission of specimen other  than nasopharyngeal swab, presence of viral mutation(s) within the  areas targeted by this assay, and inadequate number of viral copies  (<250 copies / mL). A negative result must be combined with clinical  observations,  patient history, and epidemiological information. If result is POSITIVE SARS-CoV-2 target nucleic acids are DETECTED. The SARS-CoV-2 RNA is generally detectable in upper and lower  respiratory specimens dur ing the acute phase of infection.  Positive  results are indicative of active infection with SARS-CoV-2.  Clinical  correlation with patient history and other diagnostic information is  necessary to determine patient infection status.  Positive results do  not rule out bacterial infection or co-infection with other viruses. If result is PRESUMPTIVE POSTIVE SARS-CoV-2 nucleic acids MAY BE PRESENT.   A presumptive positive result was obtained on the submitted specimen  and confirmed on repeat testing.  While 2019 novel coronavirus  (SARS-CoV-2) nucleic acids may be present in the submitted sample  additional confirmatory testing may be necessary for epidemiological  and / or clinical management purposes  to differentiate between  SARS-CoV-2 and other Sarbecovirus  currently known to infect humans.  If clinically indicated additional testing with an alternate test  methodology [phone]) is advised. The SARS-CoV-2 RNA is generally  detectable in upper and lower respiratory sp ecimens during the acute  phase of infection. The expected result is Negative. Fact Sheet for Patients:  BoilerBrush.com.cy Fact Sheet for Healthcare Providers: https://pope.com/ This test is not yet approved or cleared by the [place] FDA and has been authorized for detection and/or diagnosis of SARS-CoV-2 by FDA under an Emergency Use Authorization (EUA).  This EUA will remain in effect (meaning this test can be used) for the duration of the COVID-19 declaration under Section 564(b)(1) of the Act, 21 U.S.C. section 360bbb-3(b)(1), unless the authorization is terminated or revoked sooner. Performed at [facility], [address]  [place], [place] [address]   Blood Cultures (routine x 2)     Status: None   Collection Time: 03/30/2019 11:39 AM   Specimen: BLOOD  Result Value Ref Range Status   Specimen Description BLOOD BLOOD RIGHT FOREARM  Final   Special Requests   Final    BOTTLES DRAWN AEROBIC AND ANAEROBIC Blood Culture adequate volume   Culture   Final    NO GROWTH 5 DAYS Performed at [facility], [address] [place], [place] [address]    Report Status 04/05/2019 FINAL  Final  MRSA PCR Screening     Status: None   Collection Time: 03/25/2019  5:46 PM   Specimen: Nasopharyngeal  Result Value Ref Range Status   MRSA by PCR NEGATIVE NEGATIVE Final    Comment:        The GeneXpert MRSA Assay (FDA approved for NASAL specimens only), is one component of a comprehensive MRSA colonization surveillance program. It is not intended to diagnose MRSA infection nor to guide or monitor treatment for MRSA infections. Performed at [facility], [address] [place], [place] [address]   Culture, respiratory     Status: None   Collection Time: 04/02/19  4:50 PM   Specimen: SPU  Result Value Ref Range Status   Specimen Description   Final    SPUTUM Performed at [facility], [address] [place], [place] [address]    Special Requests   Final    NONE Performed at [facility], [address] [place], [place] [address]    Gram Stain   Final    ABUNDANT WBC PRESENT, PREDOMINANTLY PMN NO SQUAMOUS EPITHELIAL CELLS SEEN FEW GRAM POSITIVE COCCI IN CLUSTERS Performed at [facility], [address] [place], [place] [address]    Culture FEW METHICILLIN RESISTANT STAPHYLOCOCCUS AUREUS  Final   Report Status 04/05/2019 FINAL  Final   Organism ID, Bacteria METHICILLIN RESISTANT STAPHYLOCOCCUS AUREUS  Final      Susceptibility   Methicillin resistant staphylococcus aureus - MIC*    CIPROFLOXACIN >=8 RESISTANT Resistant     ERYTHROMYCIN >=8 RESISTANT Resistant      GENTAMICIN <=0.5 SENSITIVE Sensitive     OXACILLIN >=4 RESISTANT Resistant     TETRACYCLINE <=1 SENSITIVE Sensitive     VANCOMYCIN 1 SENSITIVE Sensitive     TRIMETH/SULFA >=320 RESISTANT Resistant     CLINDAMYCIN <=0.25 SENSITIVE Sensitive     RIFAMPIN <=0.5 SENSITIVE Sensitive     Inducible Clindamycin NEGATIVE Sensitive     * FEW METHICILLIN RESISTANT STAPHYLOCOCCUS AUREUS  C difficile quick scan w PCR reflex     Status: None   Collection Time: 04/04/19 12:54 PM  Specimen: STOOL  Result Value Ref Range Status   C Diff antigen NEGATIVE NEGATIVE Final   C Diff toxin NEGATIVE NEGATIVE Final   C Diff interpretation No C. difficile detected.  Final    Comment: Performed at [facility], [address] [place], [place] [address]  Culture, respiratory (non-expectorated)     Status: None   Collection Time: 04/06/19  5:27 PM   Specimen: Tracheal Aspirate; Respiratory  Result Value Ref Range Status   Specimen Description   Final    TRACHEAL ASPIRATE Performed at [facility], [address] [place], [place] [address]    Special Requests   Final    NONE Performed at [facility], [address] [place], [place] [address]    Gram Stain   Final    MODERATE WBC PRESENT, PREDOMINANTLY PMN ABUNDANT GRAM POSITIVE COCCI IN CLUSTERS Performed at [facility], [address] [place], [place] [address]    Culture FEW METHICILLIN RESISTANT STAPHYLOCOCCUS AUREUS  Final   Report Status 04/08/2019 FINAL  Final   Organism ID, Bacteria METHICILLIN RESISTANT STAPHYLOCOCCUS AUREUS  Final      Susceptibility   Methicillin resistant staphylococcus aureus - MIC*    CIPROFLOXACIN >=8 RESISTANT Resistant     ERYTHROMYCIN >=8 RESISTANT Resistant     GENTAMICIN <=0.5 SENSITIVE Sensitive     OXACILLIN >=4 RESISTANT Resistant     TETRACYCLINE <=1 SENSITIVE Sensitive     VANCOMYCIN 1 SENSITIVE Sensitive     TRIMETH/SULFA >=320 RESISTANT Resistant     CLINDAMYCIN  <=0.25 SENSITIVE Sensitive     RIFAMPIN <=0.5 SENSITIVE Sensitive     Inducible Clindamycin NEGATIVE Sensitive     * FEW METHICILLIN RESISTANT STAPHYLOCOCCUS AUREUS    Best Practice/Protocols:  VTE Prophylaxis: Lovenox (prophylaxtic dose) GI Prophylaxis: Antihistamine Continous Sedation  Events: 9/12 admitted to ICU via ED, intubated and unresponsive 9/12CXR suspicious for right lobe CAP, Lactic acid elevated 9/12Possible seizure noted, CT head showed no intracranial abnormalities 9/13MRI brain showed no abnormalities 9/15Remains minimally responsive and intubated without sedation 9/15EEG showed severe encephalopathy with no seizure activity 9/16Significantly more responsive, following commands 9/16Extubated in ICU. Immediate copious emesis and aspiration occurred 9/16Re-intubated and bronchoscopy performed to clear aspirate +MRSA pneunomonia 9/18 recurrent fever on vancomycin, Unasyn added for potential anaerobes given aspiration, recultured 9/20 final micro pending, continued issues with agitation.  Restart home antidepressant/anxiolytic 9/21 - failed SBT due to hypoxemia and vomiting episode  Studies: Dg Abd 1 View  Result Date: 04/04/2019 CLINICAL DATA:  Nasogastric tube placement. EXAM: ABDOMEN - 1 VIEW COMPARISON:  One view abdomen 02/18/2019. FINDINGS: 1234 hours. Nasogastric tube projects over the mid lumbar spine, consistent with location in the mid stomach. No significant residual gastric distention. There is no dilated small bowel within the visualized upper abdomen. Patchy airspace opacities are present at both lung bases. These are better seen on concurrent chest radiographs. IMPRESSION: Nasogastric tube projects to the level of the mid stomach. Electronically Signed   By: Carey BullocksWilliam  Veazey M.D.   On: 04/04/2019 12:57   Dg Abdomen 1 View  Result Date: 12-09-18 CLINICAL DATA:  Unresponsive.  NG tube placement EXAM: ABDOMEN - 1 VIEW COMPARISON:  None. FINDINGS: 1149  hours. Prominent gastric bubble noted. NG tube tip is in the mid stomach with proximal port of the NG tube below the EG junction. No gaseous bowel distention within the visualized abdomen. Visualized bony anatomy unremarkable. IMPRESSION: NG tube tip is in the mid stomach. Electronically Signed  By: Kennith Center M.D.   On: 2019/04/02 12:25   Ct Head Wo Contrast  Result Date: 2019-04-02 CLINICAL DATA:  Altered level of consciousness. EXAM: CT HEAD WITHOUT CONTRAST TECHNIQUE: Contiguous axial images were obtained from the base of the skull through the vertex without intravenous contrast. COMPARISON:  None. FINDINGS: Brain: There is no evidence for acute hemorrhage, hydrocephalus, mass lesion, or abnormal extra-axial fluid collection. No definite CT evidence for acute infarction. Vascular: No hyperdense vessel or unexpected calcification. Skull: No evidence for fracture. No worrisome lytic or sclerotic lesion. Sinuses/Orbits: The visualized paranasal sinuses and mastoid air cells are clear. Visualized portions of the globes and intraorbital fat are unremarkable. Other: None. IMPRESSION: Unremarkable CT evaluation of the brain. No acute intracranial abnormality. Electronically Signed   By: Kennith Center M.D.   On: 04-02-19 12:46   Mr Brain Wo Contrast  Result Date: 04/01/2019 CLINICAL DATA:  Acute presentation unresponsive. EXAM: MRI HEAD WITHOUT CONTRAST TECHNIQUE: Multiplanar, multiecho pulse sequences of the brain and surrounding structures were obtained without intravenous contrast. COMPARISON:  Head CT April 02, 2019 FINDINGS: Brain: Diffusion imaging does not show any acute or subacute infarction. Brainstem and cerebellum are normal. Cerebral hemispheres show moderate changes of chronic small vessel disease affecting the deep and subcortical white matter. No cortical or large vessel territory infarction. No mass lesion, hemorrhage, hydrocephalus or extra-axial collection. Vascular: Major vessels at the  base of the brain show flow. Skull and upper cervical spine: Negative Sinuses/Orbits: Clear/normal Other: None IMPRESSION: No abnormality seen to explain the clinical presentation. No acute or subacute infarction or any reversible finding. Moderate chronic small-vessel ischemic changes the cerebral hemispheric white matter. Electronically Signed   By: Paulina Fusi M.D.   On: 04/01/2019 14:55   Mr Laqueta Jean ZO Contrast  Result Date: 04/03/2019 CLINICAL DATA:  In cephalopathy. EXAM: MRI HEAD WITHOUT AND WITH CONTRAST TECHNIQUE: Multiplanar, multiecho pulse sequences of the brain and surrounding structures were obtained without and with intravenous contrast. CONTRAST:  7mL GADAVIST GADOBUTROL 1 MMOL/ML IV SOLN COMPARISON:  04/01/2019 FINDINGS: Brain: Diffusion imaging does not show any acute or subacute infarction. The brainstem and cerebellum are normal. Cerebral hemispheres show moderate chronic small-vessel ischemic changes of the white matter. No cortical or large vessel territory infarction. No mass lesion, hemorrhage, hydrocephalus or extra-axial collection. Vascular: Major vessels at the base of the brain show flow. Skull and upper cervical spine: Negative Sinuses/Orbits: Clear/normal. Other: Nasopharyngeal fluid related to intubation. IMPRESSION: No change. No acute intracranial finding. Moderate chronic small-vessel ischemic changes of the white matter. Electronically Signed   By: Paulina Fusi M.D.   On: 04/03/2019 18:58   Mr Cervical Spine W Wo Contrast  Result Date: 04/03/2019 CLINICAL DATA:  Myelopathy.  Weakness of the extremities. EXAM: MRI CERVICAL SPINE WITHOUT AND WITH CONTRAST TECHNIQUE: Multiplanar and multiecho pulse sequences of the cervical spine, to include the craniocervical junction and cervicothoracic junction, were obtained without and with intravenous contrast. CONTRAST:  7mL GADAVIST GADOBUTROL 1 MMOL/ML IV SOLN COMPARISON:  None. FINDINGS: Alignment: Normal Vertebrae: No fracture or  primary bone lesion. Cord: No primary cord lesion.  See below regarding stenosis. Posterior Fossa, vertebral arteries, paraspinal tissues: Negative Disc levels: No abnormality at foramen magnum, C1-2 or C2-3. C3-4: Uncovertebral hypertrophy and facet arthropathy on the left. Left foraminal stenosis could affect the C4 nerve. No central canal stenosis. C4-5: Endplate osteophytes and mild bulging of the disc. Facet arthropathy on the left. Mild left foraminal narrowing. C5-6: Broad-based, right posterolateral predominant disc  herniation effaces the ventral subarachnoid space and indents the right side of the cord. Proximal foraminal encroachment on the right. Facet arthropathy on the right. Right foraminal narrowing could affect the C6 nerve. C6-7: Spondylosis with endplate osteophytes and shallow protrusion of the disc. Narrowing the ventral subarachnoid space but no compression of the cord. Bilateral foraminal narrowing. C7-T1: No disc pathology.  Mild facet hypertrophy. T1-2: Normal. No significant enhancing pathology. IMPRESSION: No primary cord pathology. C3-4: Left foraminal stenosis due to encroachment by uncovertebral osteophytes and facet arthropathy could affect the left C4 nerve. C4-5: Mild left foraminal narrowing due to uncovertebral prominence and facet degeneration, without definite compressive stenosis. C5-6: Broad-based, right posterolateral predominant disc herniation with effacement of the ventral subarachnoid space and slight deformity of the right side of the cord. Facet arthropathy on the right. Right foraminal stenosis could affect the right C6 nerve. C6-7: Chronic degenerative spondylosis. Narrowing of the ventral subarachnoid space but no compression of the cord. Bilateral foraminal narrowing could affect either C7 nerve. Electronically Signed   By: Nelson Chimes M.D.   On: 04/03/2019 19:02   Dg Chest Port 1 View  Result Date: 04/09/2019 CLINICAL DATA:  68 year old female with respiratory  failure EXAM: PORTABLE CHEST 1 VIEW COMPARISON:  April 07, 2019, April 05, 2019 FINDINGS: Cardiomediastinal silhouette unchanged in size and contour. Unchanged endotracheal tube which terminates approximately 2.9 cm above the carina. Unchanged gastric tube terminating out of the field of view. Reticulonodular opacities of the bilateral lungs, improving from the plain film dated April 04, 2019 no pneumothorax. No pleural effusion. IMPRESSION: Bilateral reticulonodular opacities persist, improved dating to the plain films of April 04, 2019 Unchanged endotracheal tube and gastric tube Electronically Signed   By: Corrie Mckusick D.O.   On: 04/09/2019 08:01   Dg Chest Port 1 View  Result Date: 04/07/2019 CLINICAL DATA:  Acute respiratory distress.  Follow-up exam. EXAM: PORTABLE CHEST 1 VIEW COMPARISON:  04/05/2019 and earlier exams. FINDINGS: Right upper lobe airspace consolidation is significantly improved, particularly compared to 04/04/2019. Lungs show bilateral interstitial prominence with patchy area airspace opacities more diffusely, the latter also improved the previous exam. No new lung abnormalities. No convincing pleural effusion and no pneumothorax. Endotracheal tube and nasal/orogastric tube are stable and well positioned. IMPRESSION: 1. Improved lung aeration compared to most recent prior exams. This is most evident in right upper lobe with decreased airspace lung opacity. 2. No new abnormalities.  Stable support apparatus. Electronically Signed   By: Lajean Manes M.D.   On: 04/07/2019 05:58   Dg Chest Port 1 View  Result Date: 04/05/2019 CLINICAL DATA:  Acute respiratory failure EXAM: PORTABLE CHEST 1 VIEW COMPARISON:  04/04/2019 FINDINGS: Endotracheal tube and gastric catheter are again seen and stable. Cardiac shadow is stable. The lungs are well aerated bilaterally with patchy bilateral airspace opacities similar to that seen on the prior exam. No new focal infiltrate is seen. No  sizable effusion is noted. IMPRESSION: Multifocal pneumonia stable from prior exam. Electronically Signed   By: Inez Catalina M.D.   On: 04/05/2019 03:52   Dg Chest Port 1 View  Result Date: 04/04/2019 CLINICAL DATA:  Multilobar pneumonia.  Intubated patient. EXAM: PORTABLE CHEST 1 VIEW COMPARISON:  Radiographs 04/03/2019 and 04/02/2019. FINDINGS: 1233 hours. Mild patient rotation to the right. Tip of the endotracheal tube is in the mid trachea. Enteric tube projects below the diaphragm, tip not visualized. The heart size and mediastinal contours are stable. Right upper lobe airspace disease appears unchanged.  There is slight worsening of the bibasilar airspace opacities. There are possible small bilateral pleural effusions. No pneumothorax. The bones appear unchanged. IMPRESSION: 1. Stable position of the endotracheal and nasogastric tubes. 2. Worsening basilar components of bilateral airspace opacities consistent with multilobar pneumonia. Possible small bilateral pleural effusions. Electronically Signed   By: Carey Bullocks M.D.   On: 04/04/2019 12:59   Dg Chest Port 1 View  Result Date: 04/03/2019 CLINICAL DATA:  Evaluate for pneumonia. EXAM: PORTABLE CHEST 1 VIEW COMPARISON:  Chest radiograph 04/02/2019, 04/01/2019 FINDINGS: Stable cardiomediastinal contours. The endotracheal tip terminates between the thoracic inlet and carina. Nasogastric tube courses below the diaphragm with distal aspect out of field of view. No pneumothorax or pleural effusion. There are persistent diffuse right-sided heterogeneous pulmonary opacities. The left lung is clear. Visualized bony skeleton is unremarkable. IMPRESSION: Persistent diffuse right-sided heterogeneous pulmonary opacities suspicious for infection. Electronically Signed   By: Emmaline Kluver M.D.   On: 04/03/2019 15:36   Dg Chest Port 1 View  Result Date: 04/02/2019 CLINICAL DATA:  Respiratory failure. EXAM: PORTABLE CHEST 1 VIEW COMPARISON:  Radiograph of  April 01, 2019. FINDINGS: Endotracheal and nasogastric tubes are unchanged in position. No pneumothorax is noted. No significant pleural effusion is noted. Left lung is clear. Stable right upper and lower lobe airspace opacities are noted concerning for pneumonia. Bony thorax is unremarkable. IMPRESSION: Stable right lung opacities are noted concerning for pneumonia. Stable support apparatus. Electronically Signed   By: Lupita Raider M.D.   On: 04/02/2019 07:46   Dg Chest Port 1 View  Result Date: 04/01/2019 CLINICAL DATA:  68 year old female with history of respiratory failure. EXAM: PORTABLE CHEST 1 VIEW COMPARISON:  Chest x-ray Apr 03, 2019. FINDINGS: An endotracheal tube is in place with tip 5.1 cm above the carina. A nasogastric tube is seen extending into the stomach, however, the tip of the nasogastric tube extends below the lower margin of the image. Patchy multifocal interstitial and airspace disease noted throughout the right lung, most confluent in the right upper lobe, with slightly worsened aeration compared to yesterday's examination. Left lung is clear. No pleural effusions. No evidence of pulmonary edema. Heart size is normal. Upper mediastinal contours are within normal limits. IMPRESSION: 1. Severe multilobar pneumonia in the right lung, most pronounced in the right upper lobe, with worsening aeration compared to yesterday's examination. 2. Support apparatus, as above. Electronically Signed   By: Trudie Reed M.D.   On: 04/01/2019 08:03   Dg Chest Port 1 View  Result Date: 04-03-19 CLINICAL DATA:  Status post intubation and OG tube placement. EXAM: PORTABLE CHEST 1 VIEW COMPARISON:  No comparison studies available. FINDINGS: 1147 hours. Endotracheal tube tip is 5.2 cm above the base of the carina. The NG tube passes into the stomach although the distal tip position is not included on the film. Volume loss noted right hemithorax patchy airspace disease noted right upper lobe and to  a lesser degree in the right mid lung. Left lung appears hyperexpanded but clear. Interstitial markings are diffusely coarsened with chronic features. The cardiopericardial silhouette is within normal limits for size. IMPRESSION: Volume loss right hemithorax with right upper and mid lung patchy airspace disease. Endotracheal tube and NG tube as above. Electronically Signed   By: Kennith Center M.D.   On: 03-Apr-2019 12:23    Consults: Treatment Team:  Kym Groom, MD   Subjective:    Overnight Issues: Overnight became more agitated and required propofol.  Has been the same today  did not tolerate SBT.  Note that she had been on Xanax as an outpatient and Paxil.  Objective:  Vital signs for last 24 hours: Temp:  [98.3 F (36.8 C)-98.9 F (37.2 C)] 98.7 F (37.1 C) (09/22 1100) Pulse Rate:  [73-99] 88 (09/22 1500) Resp:  [14-26] 14 (09/22 1500) BP: (94-185)/(41-73) 126/49 (09/22 1500) SpO2:  [91 %-100 %] 96 % (09/22 1555) FiO2 (%):  [50 %-70 %] 50 % (09/22 1555)  Hemodynamic parameters for last 24 hours:    Intake/Output from previous day: 09/21 0701 - 09/22 0700 In: 2870.3 [I.V.:1355.1; NG/GT:1120; IV Piggyback:395.1] Out: 3480 [Urine:2930; Emesis/NG output:500; Stool:50]  Intake/Output this shift: Total I/O In: 178.3 [I.V.:178.3] Out: -   Vent settings for last 24 hours: Vent Mode: PRVC FiO2 (%):  [50 %-70 %] 50 % Set Rate:  [20 bmp] 20 bmp Vt Set:  [450 mL] 450 mL PEEP:  [5 cmH20] 5 cmH20 Pressure Support:  [5 cmH20] 5 cmH20  Physical Exam:  Gen: Intubated, RASS -1 to -2, not F/C consistently, fidgeting in bed, agitated at times HEENT: NCAT, sclerae anicteric Neck: No LAN, no JVD noted, trachea midline Lungs: Scattered rhonchi, no wheezes.  Synchronous with vent unless agitated Cardiovascular: Regular, no M noted Abdomen: Soft, NT, +BS Ext: Warm, no edema Neuro: No overt deficit noted, she moves all 4 spontaneously.  Fidgety, not following commands reliably,  impulsive Skin: No lesions noted  Assessment/Plan:  IMPRESSION: Acute hypoxemic respiratory failure RUL community-acquired pneumonia followed by aspiration pneumonia Severe sepsis, resolving Sinus tachycardia, resolved Altered mental status, now with some agitation query ICU delirium versus withdrawal from benzos Concern for seizure, ruled out, Keppra discontinued.  PLAN/REC: Cont vent support - settings reviewed and adjusted Cont vent bundle Daily SBT if/when meets criteria, failed SBT again this morning Monitor temp, WBC count Micro and abx: Most recent culture pending, initial cult. MRSA, on vancomycin and Unasyn Initiated TF protocol 09/13 DVT px: enoxaparin Monitor CBC intermittently Transfuse per usual guidelines Sedation protocol: Precedex, fentanyl.  Propofol had to be started last night due to agitation RASS goal -1 Restart Paxil, Klonopin twice a day avoid Xanax.    LOS: 10 days   Additional comments: Care coordination with bedside nurse performed today.  Discussed with RT as well.  Critical Care Total Time*: 32 Minutes    Vida Rigger, M.D.  Pulmonary & Critical Care Medicine  [facility]   *Care during the described time interval was provided by me and/or other providers on the critical care team.  I have reviewed this patient's available data, including medical history, events of note, physical examination and test results as part of my evaluation.

## 2019-04-10 NOTE — Consult Note (Signed)
Pharmacy Antibiotic Note  Sonia Farrell is a 68 y.o. female admitted on 03/22/2019 with aspiration pneumonia. CXR significant for severe multilobar pneumonia in right lung. Patient was extubated 9/16 and vomited. Increased work of breathing on non-rebreather mask and was subsequently re-intubated. Patient has finished therapy with Unasyn (stopped on 9/18 AM, but then restarted later that night) for aspiration. WBC trending up but patient afebrile. CXR noted stable multifocal pneumonia with cultures growing MRSA. Pharmacy has been consulted for vancomycin dosing.   9/20 1427 peak = 26 9/20 2310 trough = 12 Steady state AUC at current dose = 457.1  CBC today reflects resolving leukocytosis compared with 9/20; left shift still present on differential but reduced compared with 9/19. Patient is afebrile. 9/21 CXR significant for persistent bilateral reticulonodular opacities improved compared with plain films from 9/16.   Plan:  Continue vancomycin 750 mg IV Q12H. Today is Day 6 -- anticipate therapy for 7 days. Renal function stable today.   Continue to monitor labs, vitals, cultures for signs of clinical improvement with antibiotic therapy.   Height: 5\' 7"  (170.2 cm) Weight: 166 lb 0.1 oz (75.3 kg) IBW/kg (Calculated) : 61.6  Temp (24hrs), Avg:98.6 F (37 C), Min:98.3 F (36.8 C), Max:98.9 F (37.2 C)  Recent Labs  Lab 04/05/19 0452 04/06/19 0555 04/07/19 0447 04/08/19 0500 04/08/19 1427 04/08/19 2310 04/09/19 0758 04/10/19 0509  WBC 9.5 11.8* 14.5* 19.4*  --   --   --  14.3*  CREATININE 0.79 0.76 0.70 0.62  --   --  0.65 0.60  VANCOTROUGH  --   --   --   --   --  12*  --   --   VANCOPEAK  --   --   --   --  26*  --   --   --     Estimated Creatinine Clearance: 71.3 mL/min (by C-G formula based on SCr of 0.6 mg/dL).    Allergies  Allergen Reactions  . Escitalopram Oxalate Other (See Comments)    MUSCLE SPASMS    Antimicrobials this admission: 9/12 Azithromycin and  Ceftriaxone x 1 9/12 Unasyn 9/12 >> 9/21   9/17 Vancomycin >>   Dose adjustments this admission: None  Microbiology results: 9/18 tracheal aspirate cx: MRSA 9/16 C diff: negative  9/14 sputum cx: MRSA 9/12 BCx: no growth  9/12 UCx: no growth 9/12 MRSA PCR: negative  9/12 COVID: negative   Thank you for allowing pharmacy to be a part of this patient's care.  [place] Resident 04/10/2019 2:33 PM

## 2019-04-10 NOTE — Progress Notes (Signed)
Triglycerides up from 145 to 404.  Darlyn Chamber, NP notified.  New orders to change sedation from propofol to precedex and versed drips.  Will titrate off propofol once precedex and versed drips are started.

## 2019-04-10 NOTE — Consult Note (Signed)
[place] for Electrolyte Monitoring and Replacement   Recent Labs: Potassium (mmol/L)  Date Value  04/10/2019 3.4 (L)   Magnesium (mg/dL)  Date Value  04/09/2019 2.5 (H)   Calcium (mg/dL)  Date Value  04/10/2019 8.1 (L)   Albumin (g/dL)  Date Value  04/01/2019 2.9 (L)   Phosphorus (mg/dL)  Date Value  04/09/2019 5.4 (H)   Sodium (mmol/L)  Date Value  04/10/2019 144    Assessment: 68 y/o F with acute hypoxic respiratory failure due to aspiration pneumonia in the setting of possible intentional overdose with benzos and alcohol. Patient was extubated 9/16 and vomited. Increased work of breathing on non-rebreather mask and was subsequently re-intubated. Patient on vancomycin for MRSA pneumonia. C. Diff panel is negative. Patient is on SSI.    Goal of Therapy:  K ~4, Mg ~2, Phos ~2.5-4.5   Plan:  --Oral potassium solution 20 mEq x2 --BMP tomorrow AM  [place] Resident 04/10/2019 2:29 PM

## 2019-04-11 ENCOUNTER — Inpatient Hospital Stay: Payer: Medicare Other

## 2019-04-11 ENCOUNTER — Inpatient Hospital Stay: Payer: Self-pay

## 2019-04-11 LAB — BASIC METABOLIC PANEL
Anion gap: 9 (ref 5–15)
BUN: 15 mg/dL (ref 8–23)
CO2: 33 mmol/L — ABNORMAL HIGH (ref 22–32)
Calcium: 7.8 mg/dL — ABNORMAL LOW (ref 8.9–10.3)
Chloride: 99 mmol/L (ref 98–111)
Creatinine, Ser: 0.5 mg/dL (ref 0.44–1.00)
GFR calc Af Amer: >60 mL/min (ref >60)
GFR calc non Af Amer: >60 mL/min (ref >60)
Glucose, Bld: 101 mg/dL — ABNORMAL HIGH (ref 70–99)
Potassium: 4.1 mmol/L (ref 3.5–5.1)
Sodium: 141 mmol/L (ref 135–145)

## 2019-04-11 LAB — GLUCOSE, CAPILLARY
Glucose-Capillary: 110 mg/dL — ABNORMAL HIGH (ref 70–99)
Glucose-Capillary: 116 mg/dL — ABNORMAL HIGH (ref 70–99)
Glucose-Capillary: 119 mg/dL — ABNORMAL HIGH (ref 70–99)
Glucose-Capillary: 92 mg/dL (ref 70–99)
Glucose-Capillary: 93 mg/dL (ref 70–99)

## 2019-04-11 IMAGING — DX DG CHEST 1V PORT
1 series · 1 of 1 positions shown · non-contrast
Comparison: [DATE].

CLINICAL DATA: History of pulmonary disease.  Intubation.

EXAM:
PORTABLE CHEST 1 VIEW

[chest ap]
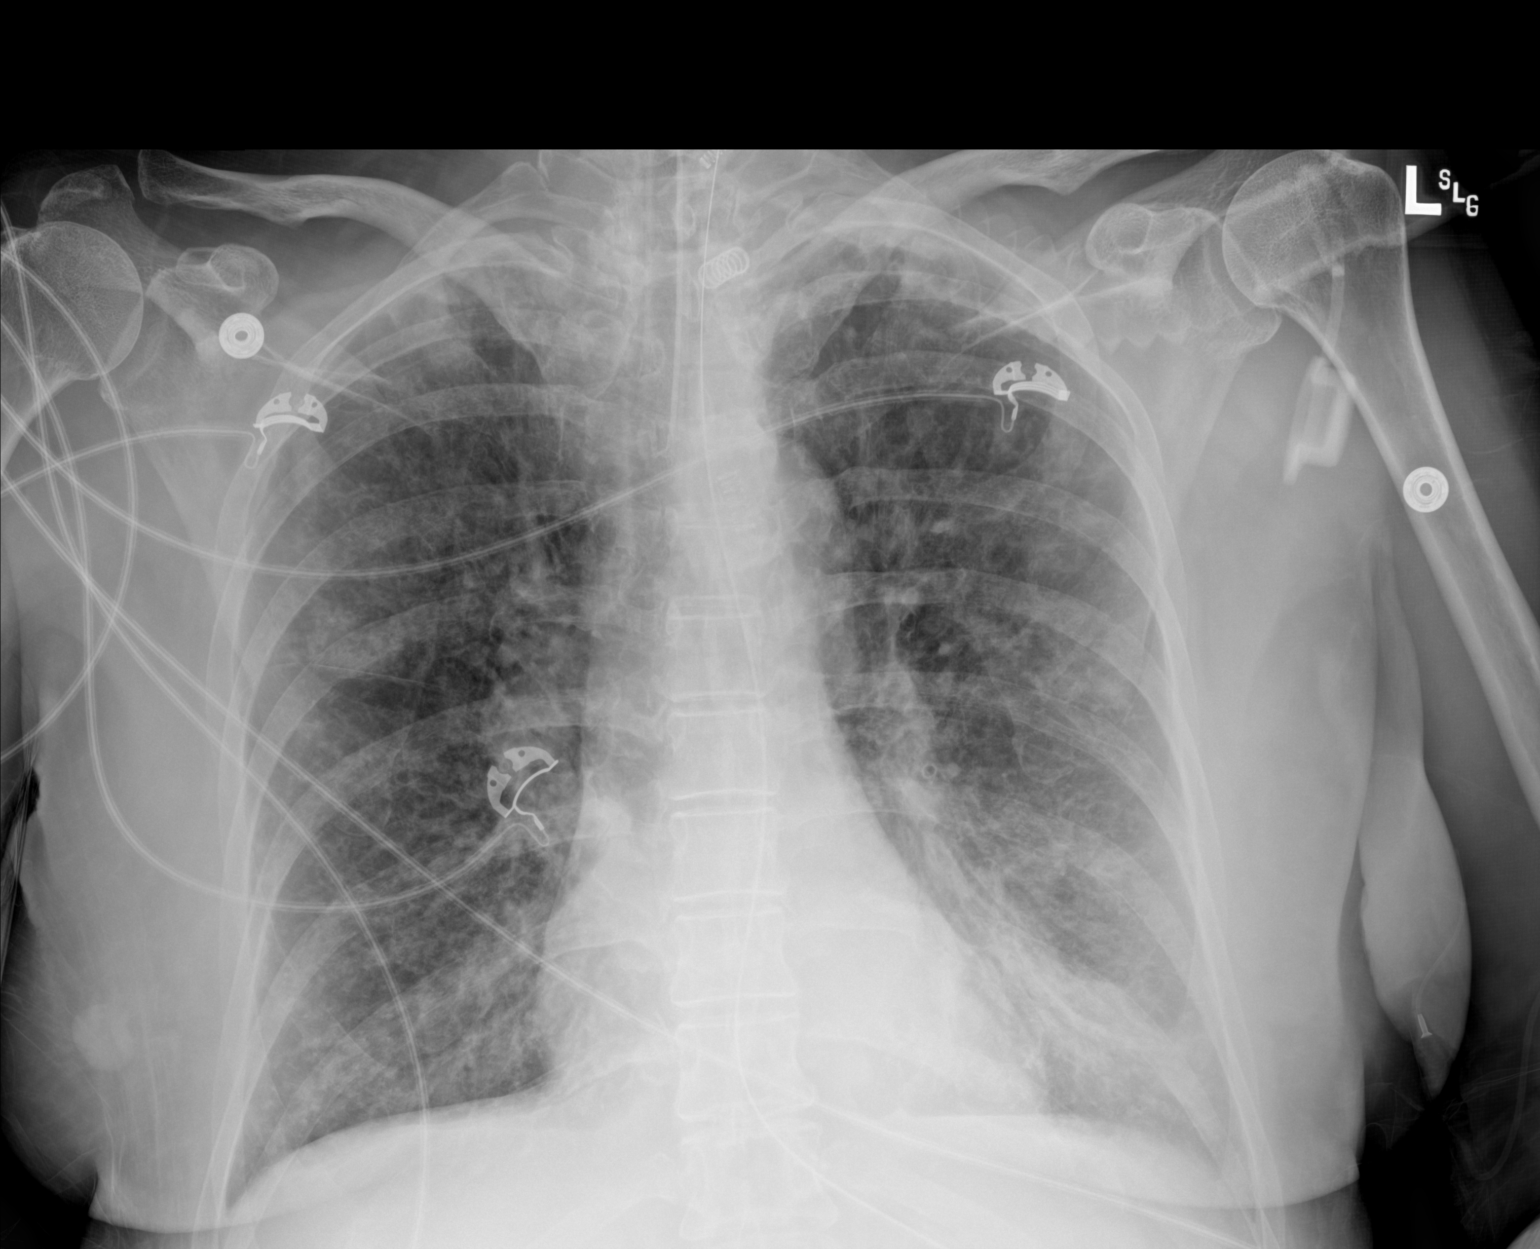

[1 of 1 positions shown; findings below may reference images not displayed]

FINDINGS: Endotracheal tube and NG tube in stable position. Heart size normal.
Diffuse bilateral pulmonary infiltrates again noted. Infiltrates
have slightly progressed from prior exam no pleural effusion or
pneumothorax.
IMPRESSION: Endotracheal tube and NG tube in stable position.

2. Diffuse bilateral pulmonary infiltrates again noted. These have
progressed slightly from prior exam.

## 2019-04-11 MED ORDER — HALOPERIDOL 0.5 MG PO TABS
0.5000 mg | ORAL_TABLET | ORAL | Status: DC | PRN
  Filled 2019-04-11: qty 1

## 2019-04-11 MED ORDER — GLYCOPYRROLATE 1 MG PO TABS
1.0000 mg | ORAL_TABLET | ORAL | Status: DC | PRN
  Filled 2019-04-11: qty 1

## 2019-04-11 MED ORDER — BIOTENE DRY MOUTH MT LIQD: 15.0000 mL | OROMUCOSAL | Status: DC | PRN

## 2019-04-11 MED ORDER — ONDANSETRON HCL 4 MG/2ML IJ SOLN: 4.0000 mg | Freq: Four times a day (QID) | INTRAMUSCULAR | Status: DC | PRN

## 2019-04-11 MED ORDER — GLYCOPYRROLATE 0.2 MG/ML IJ SOLN
0.1000 mg | Freq: Once | INTRAMUSCULAR | Status: AC
  Administered 2019-04-11: 08:00:00 0.1 mg via INTRAVENOUS
  Filled 2019-04-11: qty 1

## 2019-04-11 MED ORDER — ONDANSETRON 4 MG PO TBDP
4.0000 mg | ORAL_TABLET | Freq: Four times a day (QID) | ORAL | Status: DC | PRN
  Filled 2019-04-11: qty 1

## 2019-04-11 MED ORDER — MORPHINE 100MG IN NS 100ML (1MG/ML) PREMIX INFUSION
1.0000 mg/h | INTRAVENOUS | Status: DC
  Administered 2019-04-11: 17:00:00 2 mg/h via INTRAVENOUS
  Filled 2019-04-11: qty 100

## 2019-04-11 MED ORDER — MORPHINE BOLUS VIA INFUSION
2.0000 mg | Freq: Once | INTRAVENOUS | Status: AC
  Administered 2019-04-11: 2 mg via INTRAVENOUS
  Filled 2019-04-11: qty 2

## 2019-04-11 MED ORDER — GLYCOPYRROLATE 0.2 MG/ML IJ SOLN
0.2000 mg | INTRAMUSCULAR | Status: DC | PRN
  Administered 2019-04-11: 18:00:00 0.2 mg via INTRAVENOUS
  Filled 2019-04-11: qty 1

## 2019-04-11 MED ORDER — POLYVINYL ALCOHOL 1.4 % OP SOLN
1.0000 [drp] | Freq: Four times a day (QID) | OPHTHALMIC | Status: DC | PRN
  Filled 2019-04-11: qty 15

## 2019-04-11 MED ORDER — HALOPERIDOL LACTATE 2 MG/ML PO CONC
0.5000 mg | ORAL | Status: DC | PRN
  Filled 2019-04-11: qty 0.3

## 2019-04-11 MED ORDER — HALOPERIDOL LACTATE 5 MG/ML IJ SOLN: 0.5000 mg | INTRAMUSCULAR | Status: DC | PRN

## 2019-04-11 MED ORDER — GLYCOPYRROLATE 0.2 MG/ML IJ SOLN: 0.2000 mg | INTRAMUSCULAR | Status: DC | PRN

## 2019-04-14 LAB — CULTURE, RESPIRATORY W GRAM STAIN: Special Requests: NORMAL

## 2019-04-19 NOTE — Care Plan (Signed)
  Critical Care Medicine  Had meeting with family including next of kin brother Gwenlyn Perking, sister in law Bethena Roys and brother in Designer, industrial/product.   After lengthy discourse regarding current clinical condition family states that patient has been declining for some time and she would not wish to be on mechanical ventilation.  Per family patient would not wish for any aggressive care nor trache.  Family wants to initiate comfort care measure with terminal weaning tonight.  They understand she may pass away quick and they wish to proceed.      Ottie Glazier, M.D.  Pulmonary & [place]

## 2019-04-19 NOTE — Progress Notes (Signed)
Patient's sister-in-law, two brothers and priest were at bedside.  After meeting with Dr. Lanney Gins, they decided they wanted to proceed with comfort care and terminal extubation.  Patient was terminally extubated and started on a morphine drip.  Patient made comfortable.  Son and priest remained at beside when patient passed.  Patient was pronounced at 1814.  CDS called and was body was released.  Body taken to the morgue at this time.

## 2019-04-19 NOTE — Plan of Care (Signed)
Patient is extubated to room air 

## 2019-04-19 NOTE — Progress Notes (Signed)
Nutrition Follow-up  DOCUMENTATION CODES:   Not applicable  INTERVENTION:  Family is now discussing goals of care. Will monitor outcome of discussion. If family decides to withdraw care, continue withholding tube feeds. If family does not decide to withdraw care, recommend resuming tube feeds.  If tube feeds are resumed provide Vital 1.5 Cal at 40 mL/hr (960 mL goal daily volume) + Pro-Stat 30 mL TID per tube. Provides 1740 kcal, 110 grams of protein, 730 mL H2O daily.  If tube feeds are resumed provide liquid MVI daily per tube and a minimum free water flush of 30 mL Q4hrs to maintain tube patency.  NUTRITION DIAGNOSIS:   Inadequate oral intake related to inability to eat as evidenced by NPO status.  Ongoing.  GOAL:   Provide needs based on ASPEN/SCCM guidelines  Not met at this time.  MONITOR:   Vent status, Weight trends, Labs, TF tolerance, I & O's  REASON FOR ASSESSMENT:   Ventilator, Consult Enteral/tube feeding initiation and management  ASSESSMENT:   68 year old female with PMHx of hypothyroidism, GERD, arthritis, HLD admitted with decreased LOC and acute hypoxemic respiratory failure requiring intubation on 9/12, with PNA and sepsis.  Patient remains intubated and sedated. She has failed multiple SBTs. On 9/21 patient vomited again during an SBT and tube feeds have been off since then. Abdomen distended but soft per RN documentation. Patient is having type 7 BMs per rectal tube.  Enteral Access: 18 Fr. OGT placed 9/12; terminates in stomach per chest x-ray 9/17; 68 cm at corner of mouth  MAP: 68-101 mmHg  Patient is currently intubated on ventilator support Ve: 9.6 L/min Temp (24hrs), Avg:99.6 F (37.6 C), Min:98.9 F (37.2 C), Max:100 F (37.8 C)  Propofol: N/A  Medications reviewed and include: folic acid 1 mg daily per tube, Novolog 0-15 units Q4hrs, levothyroxine, pantoprazole, thiamine 100 mg daily, Precedex gtt, D5W at 40 ml/hr, erythromycin 250 mg  Q8hrs IV, fentanyl gtt, Versed gtt, vancomycin.  Labs reviewed: CBG 93-119, CO2 33.  I/O: 1725 mL UOP yesterday (1 mL/kg/hr)  Discussed with RN and on rounds. Initial plan was to resume tube feeds this afternoon if patient was unable to extubate. She failed SBT and was unable to extubate. However, family is now discussing GOC and considering withdrawal of care so plan is now to hold off on resumption of tube feeds.  Diet Order:   Diet Order    None     EDUCATION NEEDS:   No education needs have been identified at this time  Skin:  Skin Assessment: Reviewed RN Assessment  Last BM:  2019-04-18 - type 7 per rectal tube  Height:   Ht Readings from Last 1 Encounters:  04/18/2019 '5\' 7"'  (1.702 m)   Weight:   Wt Readings from Last 1 Encounters:  04/06/19 75.3 kg   Ideal Body Weight:  61.4 kg  BMI:  Body mass index is 26 kg/m.  Estimated Nutritional Needs:   Kcal:  0100 (PSU 2003b w/ MSJ 1353, Ve 9.6, Tmax 37.8)  Protein:  94-118 grams (1.2-1.5 grams/kg)  Fluid:  2-2.4 L/day  Willey Blade, MS, RD, LDN Office: [phone] Pager: [phone] After Hours/Weekend Pager: [phone]

## 2019-04-19 NOTE — Consult Note (Signed)
Pharmacy Antibiotic Note  Sonia Farrell is a 68 y.o. female admitted on 03/25/2019 with aspiration pneumonia. CXR significant for severe multilobar pneumonia in right lung. Patient was extubated 9/16 and vomited. Increased work of breathing on non-rebreather mask and was subsequently re-intubated. Patient has finished therapy with Unasyn (stopped on 9/18 AM, but then restarted later that night) for aspiration. WBC trending up but patient afebrile. CXR noted stable multifocal pneumonia with cultures growing MRSA. Pharmacy has been consulted for vancomycin dosing.   9/20 1427 peak = 26 9/20 2310 trough = 12 Steady state AUC at current dose = 457.1  9/22 CBC reflects resolving leukocytosis compared with 9/20; left shift still present on differential but reduced compared with 9/19. Patient is afebrile. 9/21 CXR significant for persistent bilateral reticulonodular opacities improved compared with plain films from 9/16. Erythromycin started 9/22 for gastric motility.   Plan:  Continue vancomycin 750 mg IV Q12H. Today is Day 6 -- anticipate therapy for 7 days. Renal function improving.   Continue to monitor labs, vitals, cultures for signs of clinical improvement with antibiotic therapy.   Height: 5\' 7"  (170.2 cm) Weight: 166 lb 0.1 oz (75.3 kg) IBW/kg (Calculated) : 61.6  Temp (24hrs), Avg:99.5 F (37.5 C), Min:98.7 F (37.1 C), Max:100 F (37.8 C)  Recent Labs  Lab 04/05/19 0452 04/06/19 0555 04/07/19 0447 04/08/19 0500 04/08/19 1427 04/08/19 2310 04/09/19 0758 04/10/19 0509 May 04, 2019 0553  WBC 9.5 11.8* 14.5* 19.4*  --   --   --  14.3*  --   CREATININE 0.79 0.76 0.70 0.62  --   --  0.65 0.60 0.50  VANCOTROUGH  --   --   --   --   --  12*  --   --   --   VANCOPEAK  --   --   --   --  26*  --   --   --   --     Estimated Creatinine Clearance: 71.3 mL/min (by C-G formula based on SCr of 0.5 mg/dL).    Allergies  Allergen Reactions  . Escitalopram Oxalate Other (See Comments)    MUSCLE SPASMS    Antimicrobials this admission: 9/12 Azithromycin and Ceftriaxone x 1 9/12 Unasyn 9/12 >> 9/21   9/17 Vancomycin >>  9/22 Erythromycin >>   Dose adjustments this admission: None  Microbiology results: 9/23 tracheal aspirate cx: pending 9/18 tracheal aspirate cx: MRSA 9/16 C diff: negative  9/14 sputum cx: MRSA 9/12 BCx: no growth  9/12 UCx: no growth 9/12 MRSA PCR: negative  9/12 COVID: negative   Thank you for allowing pharmacy to be a part of this patient's care.  [place] Resident 2019-05-04 9:57 AM

## 2019-04-19 NOTE — Discharge Summary (Signed)
DEATH SUMMARY   Patient Details:    Sonia Farrell is an 68 y.o. female  never smoker intubated in ED for decreased LOC and acute hypoxemic respiratory failure.      Family meeting held.  Family wishes to proceed with terminal weaning as patient has not been able to come off mechanical ventialtion and they state she would not wish to be on mechanical ventilation at all.   Lines, Airways, Drains: Airway 8 mm (Active)  Secured at (cm) 24 cm 04/07/19 1615  Measured From Lips 04/07/19 1615  Secured Location Right 04/07/19 1517  Secured By [employer] 04/07/19 1517  Tube Holder Repositioned Yes 04/07/19 1517  Cuff Pressure (cm H2O) 25 cm H2O 04/07/19 0817  Site Condition Cool;Dry 04/07/19 1517     NG/OG Tube Orogastric 18 Fr. Documented cm marking at nare/ corner of mouth 68 cm (Active)  Cm Marking at Nare/Corner of Mouth (if applicable) 68 cm 04/07/19 1615  Site Assessment Clean;Dry;Intact 04/07/19 1615  Ongoing Placement Verification No change in cm markings or external length of tube from initial placement;No change in respiratory status;No acute changes, not attributed to clinical condition 04/07/19 1615  Status Infusing tube feed 04/07/19 1615  Drainage Appearance Bile;Bloody;Brown 04/14/2019 1725  Intake (mL) 90 mL 04/07/19 1000  Output (mL) 0 mL 04/06/19 2325     Rectal Tube/Pouch (Active)  Output (mL) 400 mL 04/07/19 0345  Intake (mL) 0 mL 04/06/19 2000     Urethral Catheter Kara Knight 14 Fr. (Active)  Indication for Insertion or Continuance of Catheter Therapy based on hourly urine output monitoring and documentation for critical condition (NOT STRICT I&O) 04/07/19 0800  Site Assessment Clean;Intact 04/07/19 0800  Catheter Maintenance Bag below level of bladder;Insertion date on drainage bag;No dependent loops;Catheter secured;Drainage bag/tubing not touching floor;Seal intact;Bag emptied prior to transport 04/07/19 0800  Collection Container Standard drainage  bag 04/07/19 0800  Securement Method Securing device (Describe) 04/07/19 0800  Urinary Catheter Interventions (if applicable) Unclamped 04/07/19 0345  Input (mL) 0 mL 04/06/19 2000  Output (mL) 50 mL 04/07/19 1700    Anti-infectives:  Anti-infectives (From admission, onward)   Start     Dose/Rate Route Frequency Ordered Stop   04/10/19 1400  erythromycin 250 mg in sodium chloride 0.9 % 100 mL IVPB  Status:  Discontinued     250 mg 100 mL/hr over 60 Minutes Intravenous Every 8 hours 04/10/19 1057 04/12/19 0050   04/06/19 1800  Ampicillin-Sulbactam (UNASYN) 3 g in sodium chloride 0.9 % 100 mL IVPB  Status:  Discontinued     3 g 200 mL/hr over 30 Minutes Intravenous Every 6 hours 04/06/19 1727 04/09/19 1032   04/06/19 0000  vancomycin (VANCOCIN) IVPB 750 mg/150 ml premix  Status:  Discontinued     750 mg 150 mL/hr over 60 Minutes Intravenous Every 12 hours 04/05/19 1423 04/12/19 0050   04/05/19 1100  vancomycin (VANCOCIN) 1,500 mg in sodium chloride 0.9 % 500 mL IVPB     1,500 mg 250 mL/hr over 120 Minutes Intravenous  Once 04/05/19 1056 04/05/19 1339   03/23/2019 2200  Ampicillin-Sulbactam (UNASYN) 3 g in sodium chloride 0.9 % 100 mL IVPB  Status:  Discontinued     3 g 200 mL/hr over 30 Minutes Intravenous Every 6 hours 03/20/2019 1915 04/05/19 1056   04/03/2019 1230  cefTRIAXone (ROCEPHIN) 2 g in sodium chloride 0.9 % 100 mL IVPB  Status:  Discontinued     2 g 200 mL/hr over 30 Minutes Intravenous  Every 24 hours 2018/12/12 1224 2018/12/12 1915   2018/12/12 1230  azithromycin (ZITHROMAX) 500 mg in sodium chloride 0.9 % 250 mL IVPB  Status:  Discontinued     500 mg 250 mL/hr over 60 Minutes Intravenous Every 24 hours 2018/12/12 1224 2018/12/12 1915      Microbiology: Results for orders placed or performed during the hospital encounter of 2018/12/12  Urine culture     Status: None   Collection Time: 2018/12/12 11:12 AM   Specimen: Urine, Random  Result Value Ref Range Status   Specimen Description    Final    URINE, RANDOM Performed at [facility], [address] [place], [place] [address]    Special Requests   Final    NONE Performed at [facility], [address] [place], [place] [address]    Culture   Final    NO GROWTH Performed at [facility], [address] [place], [place] [address]    Report Status 04/02/2019 FINAL  Final  Blood Cultures (routine x 2)     Status: None   Collection Time: 2018/12/12 11:38 AM   Specimen: BLOOD  Result Value Ref Range Status   Specimen Description BLOOD BLOOD RIGHT HAND  Final   Special Requests   Final    BOTTLES DRAWN AEROBIC AND ANAEROBIC Blood Culture results may not be optimal due to an excessive volume of blood received in culture bottles   Culture   Final    NO GROWTH 5 DAYS Performed at [facility], [address] [place], [place] [address]    Report Status 04/05/2019 FINAL  Final  SARS Coronavirus 2 [facility](Hospital order, Performed in [facility] hospital lab) Nasopharyngeal     Status: None   Collection Time: 2018/12/12 11:38 AM   Specimen: Nasopharyngeal  Result Value Ref Range Status   SARS Coronavirus 2 NEGATIVE NEGATIVE Final    Comment: (NOTE) If result is NEGATIVE SARS-CoV-2 target nucleic acids are NOT DETECTED. The SARS-CoV-2 RNA is generally detectable in upper and lower  respiratory specimens during the acute phase of infection. The lowest  concentration of SARS-CoV-2 viral copies this assay can detect is 250  copies / mL. A negative result does not preclude SARS-CoV-2 infection  and should not be used as the sole basis for treatment or other  patient management decisions.  A negative result may occur with  improper specimen collection / handling, submission of specimen other  than nasopharyngeal swab, presence of viral mutation(s) within the  areas targeted by this assay, and inadequate number of viral copies  (<250 copies / mL). A negative result must be combined with clinical   observations, patient history, and epidemiological information. If result is POSITIVE SARS-CoV-2 target nucleic acids are DETECTED. The SARS-CoV-2 RNA is generally detectable in upper and lower  respiratory specimens dur ing the acute phase of infection.  Positive  results are indicative of active infection with SARS-CoV-2.  Clinical  correlation with patient history and other diagnostic information is  necessary to determine patient infection status.  Positive results do  not rule out bacterial infection or co-infection with other viruses. If result is PRESUMPTIVE POSTIVE SARS-CoV-2 nucleic acids MAY BE PRESENT.   A presumptive positive result was obtained on the submitted specimen  and confirmed on repeat testing.  While 2019 novel coronavirus  (SARS-CoV-2) nucleic acids may be present in the submitted sample  additional confirmatory testing may be necessary for epidemiological  and / or clinical management purposes  to differentiate between  SARS-CoV-2 and other Sarbecovirus currently known to infect humans.  If clinically indicated additional testing with an alternate test  methodology [phone] is advised. The SARS-CoV-2 RNA is generally  detectable in upper and lower respiratory sp ecimens during the acute  phase of infection. The expected result is Negative. Fact Sheet for Patients:  BoilerBrush.com.cy Fact Sheet for Healthcare Providers: https://pope.com/ This test is not yet approved or cleared by the [place] FDA and has been authorized for detection and/or diagnosis of SARS-CoV-2 by FDA under an Emergency Use Authorization (EUA).  This EUA will remain in effect (meaning this test can be used) for the duration of the COVID-19 declaration under Section 564(b)(1) of the Act, 21 U.S.C. section 360bbb-3(b)(1), unless the authorization is terminated or revoked sooner. Performed at [facility], [address] [place], [place] [address]   Blood Cultures (routine x 2)     Status: None   Collection Time: Apr 29, 2019 11:39 AM   Specimen: BLOOD  Result Value Ref Range Status   Specimen Description BLOOD BLOOD RIGHT FOREARM  Final   Special Requests   Final    BOTTLES DRAWN AEROBIC AND ANAEROBIC Blood Culture adequate volume   Culture   Final    NO GROWTH 5 DAYS Performed at [facility], [address] [place], [place] [address]    Report Status 04/05/2019 FINAL  Final  MRSA PCR Screening     Status: None   Collection Time: 04/29/19  5:46 PM   Specimen: Nasopharyngeal  Result Value Ref Range Status   MRSA by PCR NEGATIVE NEGATIVE Final    Comment:        The GeneXpert MRSA Assay (FDA approved for NASAL specimens only), is one component of a comprehensive MRSA colonization surveillance program. It is not intended to diagnose MRSA infection nor to guide or monitor treatment for MRSA infections. Performed at [facility], [address] [place], [place] [address]   Culture, respiratory     Status: None   Collection Time: 04/02/19  4:50 PM   Specimen: SPU  Result Value Ref Range Status   Specimen Description   Final    SPUTUM Performed at [facility], [address], [place], [place] [address]    Special Requests   Final    NONE Performed at [facility], [address] [place], [place] [address]    Gram Stain   Final    ABUNDANT WBC PRESENT, PREDOMINANTLY PMN NO SQUAMOUS EPITHELIAL CELLS SEEN FEW GRAM POSITIVE COCCI IN CLUSTERS Performed at [facility], [address], [place], [place] [address]    Culture FEW METHICILLIN RESISTANT STAPHYLOCOCCUS AUREUS  Final   Report Status 04/05/2019 FINAL  Final   Organism ID, Bacteria METHICILLIN RESISTANT STAPHYLOCOCCUS AUREUS  Final      Susceptibility   Methicillin resistant staphylococcus aureus - MIC*    CIPROFLOXACIN >=8 RESISTANT Resistant     ERYTHROMYCIN >=8 RESISTANT Resistant      GENTAMICIN <=0.5 SENSITIVE Sensitive     OXACILLIN >=4 RESISTANT Resistant     TETRACYCLINE <=1 SENSITIVE Sensitive     VANCOMYCIN 1 SENSITIVE Sensitive     TRIMETH/SULFA >=320 RESISTANT Resistant     CLINDAMYCIN <=0.25 SENSITIVE Sensitive     RIFAMPIN <=0.5 SENSITIVE Sensitive     Inducible Clindamycin NEGATIVE Sensitive     * FEW METHICILLIN RESISTANT STAPHYLOCOCCUS AUREUS  C difficile quick scan w PCR reflex     Status: None  Collection Time: 04/04/19 12:54 PM   Specimen: STOOL  Result Value Ref Range Status   C Diff antigen NEGATIVE NEGATIVE Final   C Diff toxin NEGATIVE NEGATIVE Final   C Diff interpretation No C. difficile detected.  Final    Comment: Performed at [facility], [address] [place], [place] [address]  Culture, respiratory (non-expectorated)     Status: None   Collection Time: 04/06/19  5:27 PM   Specimen: Tracheal Aspirate; Respiratory  Result Value Ref Range Status   Specimen Description   Final    TRACHEAL ASPIRATE Performed at [facility], [address], [place], [place] [address]    Special Requests   Final    NONE Performed at [facility], [address] [place], [place] [address]    Gram Stain   Final    MODERATE WBC PRESENT, PREDOMINANTLY PMN ABUNDANT GRAM POSITIVE COCCI IN CLUSTERS Performed at [facility], [address] [place], [place] [address]    Culture FEW METHICILLIN RESISTANT STAPHYLOCOCCUS AUREUS  Final   Report Status 04/08/2019 FINAL  Final   Organism ID, Bacteria METHICILLIN RESISTANT STAPHYLOCOCCUS AUREUS  Final      Susceptibility   Methicillin resistant staphylococcus aureus - MIC*    CIPROFLOXACIN >=8 RESISTANT Resistant     ERYTHROMYCIN >=8 RESISTANT Resistant     GENTAMICIN <=0.5 SENSITIVE Sensitive     OXACILLIN >=4 RESISTANT Resistant     TETRACYCLINE <=1 SENSITIVE Sensitive     VANCOMYCIN 1 SENSITIVE Sensitive     TRIMETH/SULFA >=320 RESISTANT Resistant     CLINDAMYCIN  <=0.25 SENSITIVE Sensitive     RIFAMPIN <=0.5 SENSITIVE Sensitive     Inducible Clindamycin NEGATIVE Sensitive     * FEW METHICILLIN RESISTANT STAPHYLOCOCCUS AUREUS  Culture, respiratory (non-expectorated)     Status: None (Preliminary result)   Collection Time: 03/28/2019  9:55 AM   Specimen: Tracheal Aspirate; Respiratory  Result Value Ref Range Status   Specimen Description   Final    TRACHEAL ASPIRATE Performed at [facility], [address] [place], [place] [address]    Special Requests   Final    Normal Performed at [facility], [address] [place], [place] [address]    Gram Stain   Final    ABUNDANT WBC PRESENT, PREDOMINANTLY PMN ABUNDANT GRAM POSITIVE COCCI MODERATE GRAM NEGATIVE RODS FEW GRAM VARIABLE ROD    Culture   Final    MODERATE STAPHYLOCOCCUS AUREUS SUSCEPTIBILITIES TO FOLLOW Performed at [facility], [address], [place], [place] [address]    Report Status PENDING  Incomplete    Best Practice/Protocols:  VTE Prophylaxis: Lovenox (prophylaxtic dose) GI Prophylaxis: Antihistamine Continous Sedation  Events: 9/12 admitted to ICU via ED, intubated and unresponsive 9/12CXR suspicious for right lobe CAP, Lactic acid elevated 9/12Possible seizure noted, CT head showed no intracranial abnormalities 9/13MRI brain showed no abnormalities 9/15Remains minimally responsive and intubated without sedation 9/15EEG showed severe encephalopathy with no seizure activity 9/16Significantly more responsive, following commands 9/16Extubated in ICU. Immediate copious emesis and aspiration occurred 9/16Re-intubated and bronchoscopy performed to clear aspirate +MRSA pneunomonia 9/18 recurrent fever on vancomycin, Unasyn added for potential anaerobes given aspiration, recultured 9/20 final micro pending, continued issues with agitation.  Restart home antidepressant/anxiolytic 9/21 - failed SBT due to hypoxemia and vomiting episode   Studies: Dg Abd 1 View  Result Date: 04/04/2019 CLINICAL DATA:  Nasogastric tube placement. EXAM: ABDOMEN - 1 VIEW COMPARISON:  One view abdomen  04/14/2019. FINDINGS: 1234 hours. Nasogastric tube projects over the mid lumbar spine, consistent with location in the mid stomach. No significant residual gastric distention. There is no dilated small bowel within the visualized upper abdomen. Patchy airspace opacities are present at both lung bases. These are better seen on concurrent chest radiographs. IMPRESSION: Nasogastric tube projects to the level of the mid stomach. Electronically Signed   By: Richardean Sale M.D.   On: 04/04/2019 12:57   Dg Abdomen 1 View  Result Date: 04/12/2019 CLINICAL DATA:  Unresponsive.  NG tube placement EXAM: ABDOMEN - 1 VIEW COMPARISON:  None. FINDINGS: 1149 hours. Prominent gastric bubble noted. NG tube tip is in the mid stomach with proximal port of the NG tube below the EG junction. No gaseous bowel distention within the visualized abdomen. Visualized bony anatomy unremarkable. IMPRESSION: NG tube tip is in the mid stomach. Electronically Signed   By: Misty Stanley M.D.   On: 03/26/2019 12:25   Ct Head Wo Contrast  Result Date: 04/02/2019 CLINICAL DATA:  Altered level of consciousness. EXAM: CT HEAD WITHOUT CONTRAST TECHNIQUE: Contiguous axial images were obtained from the base of the skull through the vertex without intravenous contrast. COMPARISON:  None. FINDINGS: Brain: There is no evidence for acute hemorrhage, hydrocephalus, mass lesion, or abnormal extra-axial fluid collection. No definite CT evidence for acute infarction. Vascular: No hyperdense vessel or unexpected calcification. Skull: No evidence for fracture. No worrisome lytic or sclerotic lesion. Sinuses/Orbits: The visualized paranasal sinuses and mastoid air cells are clear. Visualized portions of the globes and intraorbital fat are unremarkable. Other: None. IMPRESSION: Unremarkable CT evaluation of the  brain. No acute intracranial abnormality. Electronically Signed   By: Misty Stanley M.D.   On: 04/14/2019 12:46   Mr Brain Wo Contrast  Result Date: 04/01/2019 CLINICAL DATA:  Acute presentation unresponsive. EXAM: MRI HEAD WITHOUT CONTRAST TECHNIQUE: Multiplanar, multiecho pulse sequences of the brain and surrounding structures were obtained without intravenous contrast. COMPARISON:  Head CT 03/30/2019 FINDINGS: Brain: Diffusion imaging does not show any acute or subacute infarction. Brainstem and cerebellum are normal. Cerebral hemispheres show moderate changes of chronic small vessel disease affecting the deep and subcortical white matter. No cortical or large vessel territory infarction. No mass lesion, hemorrhage, hydrocephalus or extra-axial collection. Vascular: Major vessels at the base of the brain show flow. Skull and upper cervical spine: Negative Sinuses/Orbits: Clear/normal Other: None IMPRESSION: No abnormality seen to explain the clinical presentation. No acute or subacute infarction or any reversible finding. Moderate chronic small-vessel ischemic changes the cerebral hemispheric white matter. Electronically Signed   By: Nelson Chimes M.D.   On: 04/01/2019 14:55   Mr Jeri Cos NG Contrast  Result Date: 04/03/2019 CLINICAL DATA:  In cephalopathy. EXAM: MRI HEAD WITHOUT AND WITH CONTRAST TECHNIQUE: Multiplanar, multiecho pulse sequences of the brain and surrounding structures were obtained without and with intravenous contrast. CONTRAST:  84mL GADAVIST GADOBUTROL 1 MMOL/ML IV SOLN COMPARISON:  04/01/2019 FINDINGS: Brain: Diffusion imaging does not show any acute or subacute infarction. The brainstem and cerebellum are normal. Cerebral hemispheres show moderate chronic small-vessel ischemic changes of the white matter. No cortical or large vessel territory infarction. No mass lesion, hemorrhage, hydrocephalus or extra-axial collection. Vascular: Major vessels at the base of the brain show flow.  Skull and upper cervical spine: Negative Sinuses/Orbits: Clear/normal. Other: Nasopharyngeal fluid related to intubation. IMPRESSION: No change. No acute intracranial finding. Moderate chronic small-vessel ischemic changes of the white matter. Electronically Signed   By: Nelson Chimes  M.D.   On: 04/03/2019 18:58   Mr Cervical Spine W Wo Contrast  Result Date: 04/03/2019 CLINICAL DATA:  Myelopathy.  Weakness of the extremities. EXAM: MRI CERVICAL SPINE WITHOUT AND WITH CONTRAST TECHNIQUE: Multiplanar and multiecho pulse sequences of the cervical spine, to include the craniocervical junction and cervicothoracic junction, were obtained without and with intravenous contrast. CONTRAST:  7mL GADAVIST GADOBUTROL 1 MMOL/ML IV SOLN COMPARISON:  None. FINDINGS: Alignment: Normal Vertebrae: No fracture or primary bone lesion. Cord: No primary cord lesion.  See below regarding stenosis. Posterior Fossa, vertebral arteries, paraspinal tissues: Negative Disc levels: No abnormality at foramen magnum, C1-2 or C2-3. C3-4: Uncovertebral hypertrophy and facet arthropathy on the left. Left foraminal stenosis could affect the C4 nerve. No central canal stenosis. C4-5: Endplate osteophytes and mild bulging of the disc. Facet arthropathy on the left. Mild left foraminal narrowing. C5-6: Broad-based, right posterolateral predominant disc herniation effaces the ventral subarachnoid space and indents the right side of the cord. Proximal foraminal encroachment on the right. Facet arthropathy on the right. Right foraminal narrowing could affect the C6 nerve. C6-7: Spondylosis with endplate osteophytes and shallow protrusion of the disc. Narrowing the ventral subarachnoid space but no compression of the cord. Bilateral foraminal narrowing. C7-T1: No disc pathology.  Mild facet hypertrophy. T1-2: Normal. No significant enhancing pathology. IMPRESSION: No primary cord pathology. C3-4: Left foraminal stenosis due to encroachment by  uncovertebral osteophytes and facet arthropathy could affect the left C4 nerve. C4-5: Mild left foraminal narrowing due to uncovertebral prominence and facet degeneration, without definite compressive stenosis. C5-6: Broad-based, right posterolateral predominant disc herniation with effacement of the ventral subarachnoid space and slight deformity of the right side of the cord. Facet arthropathy on the right. Right foraminal stenosis could affect the right C6 nerve. C6-7: Chronic degenerative spondylosis. Narrowing of the ventral subarachnoid space but no compression of the cord. Bilateral foraminal narrowing could affect either C7 nerve. Electronically Signed   By: Paulina Fusi M.D.   On: 04/03/2019 19:02   Dg Chest Port 1 View  Result Date: 04/08/2019 CLINICAL DATA:  History of pulmonary disease.  Intubation. EXAM: PORTABLE CHEST 1 VIEW COMPARISON:  04/09/2019. FINDINGS: Endotracheal tube and NG tube in stable position. Heart size normal. Diffuse bilateral pulmonary infiltrates again noted. Infiltrates have slightly progressed from prior exam no pleural effusion or pneumothorax. IMPRESSION: Endotracheal tube and NG tube in stable position. 2. Diffuse bilateral pulmonary infiltrates again noted. These have progressed slightly from prior exam. Electronically Signed   By: Maisie Fus  Register   On: 03/24/2019 11:15   Dg Chest Port 1 View  Result Date: 04/09/2019 CLINICAL DATA:  68 year old female with respiratory failure EXAM: PORTABLE CHEST 1 VIEW COMPARISON:  April 07, 2019, April 05, 2019 FINDINGS: Cardiomediastinal silhouette unchanged in size and contour. Unchanged endotracheal tube which terminates approximately 2.9 cm above the carina. Unchanged gastric tube terminating out of the field of view. Reticulonodular opacities of the bilateral lungs, improving from the plain film dated April 04, 2019 no pneumothorax. No pleural effusion. IMPRESSION: Bilateral reticulonodular opacities persist,  improved dating to the plain films of April 04, 2019 Unchanged endotracheal tube and gastric tube Electronically Signed   By: Gilmer Mor D.O.   On: 04/09/2019 08:01   Dg Chest Port 1 View  Result Date: 04/07/2019 CLINICAL DATA:  Acute respiratory distress.  Follow-up exam. EXAM: PORTABLE CHEST 1 VIEW COMPARISON:  04/05/2019 and earlier exams. FINDINGS: Right upper lobe airspace consolidation is significantly improved, particularly compared to 04/04/2019. Lungs show  bilateral interstitial prominence with patchy area airspace opacities more diffusely, the latter also improved the previous exam. No new lung abnormalities. No convincing pleural effusion and no pneumothorax. Endotracheal tube and nasal/orogastric tube are stable and well positioned. IMPRESSION: 1. Improved lung aeration compared to most recent prior exams. This is most evident in right upper lobe with decreased airspace lung opacity. 2. No new abnormalities.  Stable support apparatus. Electronically Signed   By: Amie Portland M.D.   On: 04/07/2019 05:58   Dg Chest Port 1 View  Result Date: 04/05/2019 CLINICAL DATA:  Acute respiratory failure EXAM: PORTABLE CHEST 1 VIEW COMPARISON:  04/04/2019 FINDINGS: Endotracheal tube and gastric catheter are again seen and stable. Cardiac shadow is stable. The lungs are well aerated bilaterally with patchy bilateral airspace opacities similar to that seen on the prior exam. No new focal infiltrate is seen. No sizable effusion is noted. IMPRESSION: Multifocal pneumonia stable from prior exam. Electronically Signed   By: Alcide Clever M.D.   On: 04/05/2019 03:52   Dg Chest Port 1 View  Result Date: 04/04/2019 CLINICAL DATA:  Multilobar pneumonia.  Intubated patient. EXAM: PORTABLE CHEST 1 VIEW COMPARISON:  Radiographs 04/03/2019 and 04/02/2019. FINDINGS: 1233 hours. Mild patient rotation to the right. Tip of the endotracheal tube is in the mid trachea. Enteric tube projects below the diaphragm, tip not  visualized. The heart size and mediastinal contours are stable. Right upper lobe airspace disease appears unchanged. There is slight worsening of the bibasilar airspace opacities. There are possible small bilateral pleural effusions. No pneumothorax. The bones appear unchanged. IMPRESSION: 1. Stable position of the endotracheal and nasogastric tubes. 2. Worsening basilar components of bilateral airspace opacities consistent with multilobar pneumonia. Possible small bilateral pleural effusions. Electronically Signed   By: Carey Bullocks M.D.   On: 04/04/2019 12:59   Dg Chest Port 1 View  Result Date: 04/03/2019 CLINICAL DATA:  Evaluate for pneumonia. EXAM: PORTABLE CHEST 1 VIEW COMPARISON:  Chest radiograph 04/02/2019, 04/01/2019 FINDINGS: Stable cardiomediastinal contours. The endotracheal tip terminates between the thoracic inlet and carina. Nasogastric tube courses below the diaphragm with distal aspect out of field of view. No pneumothorax or pleural effusion. There are persistent diffuse right-sided heterogeneous pulmonary opacities. The left lung is clear. Visualized bony skeleton is unremarkable. IMPRESSION: Persistent diffuse right-sided heterogeneous pulmonary opacities suspicious for infection. Electronically Signed   By: Emmaline Kluver M.D.   On: 04/03/2019 15:36   Dg Chest Port 1 View  Result Date: 04/02/2019 CLINICAL DATA:  Respiratory failure. EXAM: PORTABLE CHEST 1 VIEW COMPARISON:  Radiograph of April 01, 2019. FINDINGS: Endotracheal and nasogastric tubes are unchanged in position. No pneumothorax is noted. No significant pleural effusion is noted. Left lung is clear. Stable right upper and lower lobe airspace opacities are noted concerning for pneumonia. Bony thorax is unremarkable. IMPRESSION: Stable right lung opacities are noted concerning for pneumonia. Stable support apparatus. Electronically Signed   By: Lupita Raider M.D.   On: 04/02/2019 07:46   Dg Chest Port 1  View  Result Date: 04/01/2019 CLINICAL DATA:  68 year old female with history of respiratory failure. EXAM: PORTABLE CHEST 1 VIEW COMPARISON:  Chest x-ray 03/20/2019. FINDINGS: An endotracheal tube is in place with tip 5.1 cm above the carina. A nasogastric tube is seen extending into the stomach, however, the tip of the nasogastric tube extends below the lower margin of the image. Patchy multifocal interstitial and airspace disease noted throughout the right lung, most confluent in the right upper lobe, with  slightly worsened aeration compared to yesterday's examination. Left lung is clear. No pleural effusions. No evidence of pulmonary edema. Heart size is normal. Upper mediastinal contours are within normal limits. IMPRESSION: 1. Severe multilobar pneumonia in the right lung, most pronounced in the right upper lobe, with worsening aeration compared to yesterday's examination. 2. Support apparatus, as above. Electronically Signed   By: Trudie Reed M.D.   On: 04/01/2019 08:03   Dg Chest Port 1 View  Result Date: 04-26-19 CLINICAL DATA:  Status post intubation and OG tube placement. EXAM: PORTABLE CHEST 1 VIEW COMPARISON:  No comparison studies available. FINDINGS: 1147 hours. Endotracheal tube tip is 5.2 cm above the base of the carina. The NG tube passes into the stomach although the distal tip position is not included on the film. Volume loss noted right hemithorax patchy airspace disease noted right upper lobe and to a lesser degree in the right mid lung. Left lung appears hyperexpanded but clear. Interstitial markings are diffusely coarsened with chronic features. The cardiopericardial silhouette is within normal limits for size. IMPRESSION: Volume loss right hemithorax with right upper and mid lung patchy airspace disease. Endotracheal tube and NG tube as above. Electronically Signed   By: Kennith Center M.D.   On: 26-Apr-2019 12:23   [place] Ekg Site Rite  Result Date: 04/14/2019 If Site Rite image  not attached, placement could not be confirmed due to current cardiac rhythm.   Consults: Treatment Team:  Kym Groom, MD   Subjective:    Overnight Issues: Overnight became more agitated and required propofol.  Has been the same today did not tolerate SBT.  Note that she had been on Xanax as an outpatient and Paxil.  Objective:  Vital signs for last 24 hours:    Hemodynamic parameters for last 24 hours:    Intake/Output from previous day: 09/24 0701 - 09/25 0700 In: 309.6 [I.V.:209.6; IV Piggyback:100] Out: -   Intake/Output this shift: Total I/O In: 309.6 [I.V.:209.6; IV Piggyback:100] Out: -   Vent settings for last 24 hours:    Physical Exam:  Gen: Intubated, RASS -1 to -2, not F/C consistently, fidgeting in bed, agitated at times HEENT: NCAT, sclerae anicteric Neck: No LAN, no JVD noted, trachea midline Lungs: Scattered rhonchi, no wheezes.  Synchronous with vent unless agitated Cardiovascular: Regular, no M noted Abdomen: Soft, NT, +BS Ext: Warm, no edema Neuro: No overt deficit noted, she moves all 4 spontaneously.  Fidgety, not following commands reliably, impulsive Skin: No lesions noted     Assessment/Plan:  IMPRESSION: Acute hypoxemic respiratory failure RUL community-acquired pneumonia followed by aspiration pneumonia Severe sepsis, resolving Sinus tachycardia, resolved Altered mental status -sedation vs MRSA pna related     LOS: 11 days      Vida Rigger, M.D.  Pulmonary & Critical Care Medicine  [facility]   *Care during the described time interval was provided by me and/or other providers on the critical care team.  I have reviewed this patient's available data, including medical history, events of note, physical examination and test results as part of my evaluation.

## 2019-04-19 NOTE — Consult Note (Signed)
[place] for Electrolyte Monitoring and Replacement   Recent Labs: Potassium (mmol/L)  Date Value  2019-04-27 4.1   Magnesium (mg/dL)  Date Value  04/09/2019 2.5 (H)   Calcium (mg/dL)  Date Value  27-Apr-2019 7.8 (L)   Albumin (g/dL)  Date Value  04/01/2019 2.9 (L)   Phosphorus (mg/dL)  Date Value  04/09/2019 5.4 (H)   Sodium (mmol/L)  Date Value  Apr 27, 2019 141    Assessment: 68 y/o F with acute hypoxic respiratory failure due to aspiration pneumonia in the setting of possible intentional overdose with benzos and alcohol. Patient was extubated 9/16 and vomited. Increased work of breathing on non-rebreather mask and was subsequently re-intubated. Patient on vancomycin for MRSA pneumonia. C. Diff panel is negative. Patient is on SSI. IV erythromycin started 9/22.   Goal of Therapy:  K ~4, Mg ~2, Phos ~2.5-4.5   Plan:  --Potassium level today WNL. However, sample was hemolyzed. --No electrolyte replacement warranted today --BMP tomorrow AM  [place] Resident 27-Apr-2019 9:54 AM

## 2019-04-19 NOTE — Progress Notes (Signed)
[facility] [place] NAME: Sonia Farrell    MR#:  [id number]  DATE OF BIRTH:  1951-04-09  SUBJECTIVE:  CHIEF COMPLAINT:   Chief Complaint  Patient presents with  . Unresponsive   -Remains intubated, not following commands.  On sedation  REVIEW OF SYSTEMS:  Review of Systems  Unable to perform ROS: Critical illness    DRUG ALLERGIES:   Allergies  Allergen Reactions  . Escitalopram Oxalate Other (See Comments)    MUSCLE SPASMS    VITALS:  Blood pressure (!) 124/51, pulse 88, temperature 98.9 F (37.2 C), temperature source Oral, resp. rate 14, height 5\' 7"  (1.702 m), weight 75.3 kg, SpO2 96 %.  PHYSICAL EXAMINATION:  Physical Exam   GENERAL:  68 y.o.-year-old patient lying in the bed, critically ill-appearing EYES: Pupils equal, round, reactive to light and accommodation. No scleral icterus. Extraocular muscles intact.  HEENT: Head atraumatic, normocephalic.  Orally intubated, has a OG tube.  Increased oral secretions noted. NECK:  Supple, no jugular venous distention. No thyroid enlargement, no tenderness.  LUNGS:  Normal breath sounds bilaterally, no wheezing, rales,rhonchi or crepitation. No use of accessory muscles of respiration.  Decreased bibasilar breath sounds CARDIOVASCULAR: S1, S2 normal. No murmurs, rubs, or gallops.  ABDOMEN: Soft, nontender, nondistended. Bowel sounds present. No organomegaly or mass.  EXTREMITIES: No pedal edema, cyanosis, or clubbing.  NEUROLOGIC: Sedated, opening eyes to palpation, not tracking or following commands..  Localizing pain. PSYCHIATRIC: The patient is sedated  SKIN: No obvious rash, lesion, or ulcer.    LABORATORY PANEL:   CBC Recent Labs  Lab 04/10/19 0509  WBC 14.3*  HGB 9.3*  HCT 30.1*  PLT 401*   ------------------------------------------------------------------------------------------------------------------  Chemistries  Recent Labs  Lab 04/09/19 0758   03/28/2019 0553  NA 142   < > 141  K 4.5   < > 4.1  CL 103   < > 99  CO2 30   < > 33*  GLUCOSE 162*   < > 101*  BUN 28*   < > 15  CREATININE 0.65   < > 0.50  CALCIUM 8.8*   < > 7.8*  MG 2.5*  --   --    < > = values in this interval not displayed.   ------------------------------------------------------------------------------------------------------------------  Cardiac Enzymes No results for input(s): TROPONINI in the last 168 hours. ------------------------------------------------------------------------------------------------------------------  RADIOLOGY:  [place] Ekg Site Rite  Result Date: 03/29/2019 If Site Rite image not attached, placement could not be confirmed due to current cardiac rhythm.   EKG:   Orders placed or performed during the hospital encounter of 04/16/19  . ED EKG  . ED EKG  . EKG 12-Lead  . EKG 12-Lead  . EKG    ASSESSMENT AND PLAN:   68 year old female, no history of smoking but has history of hyperlipidemia, hypothyroidism whose husband passed away recently, was brought in for unresponsiveness and intubated for airway protection.  1.  Acute hypoxic respiratory failure-secondary to aspiration pneumonia -Initially patient intubated for airway protection as there is concern for intentional overdose with benzos -While trying to extubate on 04/04/2019-she had to be reintubated secondary to aspiration pneumonia.  Bronchoscopy was performed to clear the aspirate. -unable to tolerate SBT for the last 2 days -Appreciate pulmonary consult  2.  MRSA pneumonia and aspiration pneumonia-sepsis secondary to the same -Continue antibiotics -Currently only on vancomycin.  3.  Acute toxic metabolic encephalopathy-secondary to benzo overdose as benzo bottle was found near  her. -MRI of the brain negative.  Repeat MRI of the brain and C-spine are unremarkable except for disc herniation. -EEG with moderate to severe diffuse encephalopathy. -Off Keppra. -Currently on  fentanyl and propofol.    4.  GI prophylaxis-Protonix -Started on erythromycin for gastroparesis because patient has had aspiration and vomiting episodes  5.  DVT prophylaxis-Lovenox  Overall poor prognosis Recommend palliative care consultation   All the records are reviewed and case discussed with Care Management/Social Workerr. Management plans discussed with the patient, family and they are in agreement.  CODE STATUS: Full code  TOTAL TIME TAKING CARE OF THIS PATIENT:34 minutes.   POSSIBLE D/C IN ? DAYS, DEPENDING ON CLINICAL CONDITION.   Enid Baas M.D on 04/10/2019 at 8:39 AM  Between 7am to 6pm - Pager - [phone]  After 6pm go to [email or web address] - Social research officer, government  [facility]  Office  [phone]  CC: Primary care physician; Jerl Mina, MD

## 2019-04-19 NOTE — Plan of Care (Signed)
PMT note:  Per nursing, decision makers/family members coming into town this evening for a meeting with CCM. No decision makers at bedside at this time. Will return tomorrow.

## 2019-04-19 NOTE — Progress Notes (Signed)
Spoke to floor RN, patient need another PIV at this time. PICC on hold,RN will notify PICC team if needed. Will follow up.

## 2019-04-19 NOTE — Progress Notes (Signed)
Follow up - Critical Care Medicine Note  Patient Details:    Sonia Farrell is an 68 y.o. female  never smoker intubated in ED for decreased LOC and acute hypoxemic respiratory failure.  Patient overdosed intentionally.  CXR c/w PNA.  She was extubated (9/16) but vomited and aspirated and required reintubation.  Sputum consistent with MRSA.  Plan for repeat SBT today. Patient failed weaning trial last 3 days despite not having nourishment and rubinol for secretions.  Was hypoxemic with labored breathing then apneic during trial today. I met with Judy Farrell (patient sister in law) discussed case and outlined patients overall critically ill state.  Additional meeting regarding goals of care today with brother and family pastor.   Lines, Airways, Drains: Airway 8 mm (Active)  Secured at (cm) 24 cm 04/07/19 1615  Measured From Lips 04/07/19 1615  Secured Location Right 04/07/19 1517  Secured By [employer] 04/07/19 1517  Tube Holder Repositioned Yes 04/07/19 1517  Cuff Pressure (cm H2O) 25 cm H2O 04/07/19 0817  Site Condition Cool;Dry 04/07/19 1517     NG/OG Tube Orogastric 18 Fr. Documented cm marking at nare/ corner of mouth 68 cm (Active)  Cm Marking at Nare/Corner of Mouth (if applicable) 68 cm 21/19/41 1615  Site Assessment Clean;Dry;Intact 04/07/19 1615  Ongoing Placement Verification No change in cm markings or external length of tube from initial placement;No change in respiratory status;No acute changes, not attributed to clinical condition 04/07/19 1615  Status Infusing tube feed 04/07/19 1615  Drainage Appearance Bile;Bloody;Brown 03/30/2019 1725  Intake (mL) 90 mL 04/07/19 1000  Output (mL) 0 mL 04/06/19 2325     Rectal Tube/Pouch (Active)  Output (mL) 400 mL 04/07/19 0345  Intake (mL) 0 mL 04/06/19 2000     Urethral Catheter Kara Knight 14 Fr. (Active)  Indication for Insertion or Continuance of Catheter Therapy based on hourly urine output monitoring and  documentation for critical condition (NOT STRICT I&O) 04/07/19 0800  Site Assessment Clean;Intact 04/07/19 0800  Catheter Maintenance Bag below level of bladder;Insertion date on drainage bag;No dependent loops;Catheter secured;Drainage bag/tubing not touching floor;Seal intact;Bag emptied prior to transport 04/07/19 0800  Collection Container Standard drainage bag 04/07/19 0800  Securement Method Securing device (Describe) 04/07/19 0800  Urinary Catheter Interventions (if applicable) Unclamped 74/08/14 0345  Input (mL) 0 mL 04/06/19 2000  Output (mL) 50 mL 04/07/19 1700    Anti-infectives:  Anti-infectives (From admission, onward)   Start     Dose/Rate Route Frequency Ordered Stop   04/10/19 1400  erythromycin 250 mg in sodium chloride 0.9 % 100 mL IVPB     250 mg 100 mL/hr over 60 Minutes Intravenous Every 8 hours 04/10/19 1057 04/13/19 1459   04/06/19 1800  Ampicillin-Sulbactam (UNASYN) 3 g in sodium chloride 0.9 % 100 mL IVPB  Status:  Discontinued     3 g 200 mL/hr over 30 Minutes Intravenous Every 6 hours 04/06/19 1727 04/09/19 1032   04/06/19 0000  vancomycin (VANCOCIN) IVPB 750 mg/150 ml premix     750 mg 150 mL/hr over 60 Minutes Intravenous Every 12 hours 04/05/19 1423     04/05/19 1100  vancomycin (VANCOCIN) 1,500 mg in sodium chloride 0.9 % 500 mL IVPB     1,500 mg 250 mL/hr over 120 Minutes Intravenous  Once 04/05/19 1056 04/05/19 1339   03/22/2019 2200  Ampicillin-Sulbactam (UNASYN) 3 g in sodium chloride 0.9 % 100 mL IVPB  Status:  Discontinued     3 g 200 mL/hr over  30 Minutes Intravenous Every 6 hours 04/16/2019 1915 04/05/19 1056   04/05/2019 1230  cefTRIAXone (ROCEPHIN) 2 g in sodium chloride 0.9 % 100 mL IVPB  Status:  Discontinued     2 g 200 mL/hr over 30 Minutes Intravenous Every 24 hours 04/16/2019 1224 04/13/2019 1915   04/01/2019 1230  azithromycin (ZITHROMAX) 500 mg in sodium chloride 0.9 % 250 mL IVPB  Status:  Discontinued     500 mg 250 mL/hr over 60 Minutes  Intravenous Every 24 hours 04/04/2019 1224 03/26/2019 1915      Microbiology: Results for orders placed or performed during the hospital encounter of 04/07/2019  Urine culture     Status: None   Collection Time: 03/30/2019 11:12 AM   Specimen: Urine, Random  Result Value Ref Range Status   Specimen Description   Final    URINE, RANDOM Performed at [facility], [address], [place] [employer] [place], [place] [address]    Special Requests   Final    NONE Performed at [facility], [address], [place], [place] [address]    Culture   Final    NO GROWTH Performed at [facility], [place] [address] [place], [place] [address]    Report Status 04/02/2019 FINAL  Final  Blood Cultures (routine x 2)     Status: None   Collection Time: 03/28/2019 11:38 AM   Specimen: BLOOD  Result Value Ref Range Status   Specimen Description BLOOD BLOOD RIGHT HAND  Final   Special Requests   Final    BOTTLES DRAWN AEROBIC AND ANAEROBIC Blood Culture results may not be optimal due to an excessive volume of blood received in culture bottles   Culture   Final    NO GROWTH 5 DAYS Performed at [facility], [address], [place], [place] [address]    Report Status 04/05/2019 FINAL  Final  SARS Coronavirus 2 [facility] order, Performed in [place] hospital lab) Nasopharyngeal     Status: None   Collection Time: 04/16/2019 11:38 AM   Specimen: Nasopharyngeal  Result Value Ref Range Status   SARS Coronavirus 2 NEGATIVE NEGATIVE Final    Comment: (NOTE) If result is NEGATIVE SARS-CoV-2 target nucleic acids are NOT DETECTED. The SARS-CoV-2 RNA is generally detectable in upper and lower  respiratory specimens during the acute phase of infection. The lowest  concentration of SARS-CoV-2 viral copies this assay can detect is 250  copies / mL. A negative result does not preclude SARS-CoV-2 infection  and should not be used as the sole basis for treatment or other  patient management decisions.   A negative result may occur with  improper specimen collection / handling, submission of specimen other  than nasopharyngeal swab, presence of viral mutation(s) within the  areas targeted by this assay, and inadequate number of viral copies  (<250 copies / mL). A negative result must be combined with clinical  observations, patient history, and epidemiological information. If result is POSITIVE SARS-CoV-2 target nucleic acids are DETECTED. The SARS-CoV-2 RNA is generally detectable in upper and lower  respiratory specimens dur ing the acute phase of infection.  Positive  results are indicative of active infection with SARS-CoV-2.  Clinical  correlation with patient history and other diagnostic information is  necessary to determine patient infection status.  Positive results do  not rule out bacterial infection or co-infection with other viruses. If result is PRESUMPTIVE POSTIVE SARS-CoV-2 nucleic acids MAY BE PRESENT.   A presumptive positive result was  obtained on the submitted specimen  and confirmed on repeat testing.  While 2019 novel coronavirus  (SARS-CoV-2) nucleic acids may be present in the submitted sample  additional confirmatory testing may be necessary for epidemiological  and / or clinical management purposes  to differentiate between  SARS-CoV-2 and other Sarbecovirus currently known to infect humans.  If clinically indicated additional testing with an alternate test  methodology [phone]) is advised. The SARS-CoV-2 RNA is generally  detectable in upper and lower respiratory sp ecimens during the acute  phase of infection. The expected result is Negative. Fact Sheet for Patients:  StrictlyIdeas.no Fact Sheet for Healthcare Providers: BankingDealers.co.za This test is not yet approved or cleared by the [place] FDA and has been authorized for detection and/or diagnosis of SARS-CoV-2 by FDA under an Emergency Use  Authorization (EUA).  This EUA will remain in effect (meaning this test can be used) for the duration of the COVID-19 declaration under Section 564(b)(1) of the Act, 21 U.S.C. section 360bbb-3(b)(1), unless the authorization is terminated or revoked sooner. Performed at [facility], [address] [place], [place] [address]   Blood Cultures (routine x 2)     Status: None   Collection Time: 04/05/2019 11:39 AM   Specimen: BLOOD  Result Value Ref Range Status   Specimen Description BLOOD BLOOD RIGHT FOREARM  Final   Special Requests   Final    BOTTLES DRAWN AEROBIC AND ANAEROBIC Blood Culture adequate volume   Culture   Final    NO GROWTH 5 DAYS Performed at [facility], [address] [place], [place] [address]    Report Status 04/05/2019 FINAL  Final  MRSA PCR Screening     Status: None   Collection Time: 03/27/2019  5:46 PM   Specimen: Nasopharyngeal  Result Value Ref Range Status   MRSA by PCR NEGATIVE NEGATIVE Final    Comment:        The GeneXpert MRSA Assay (FDA approved for NASAL specimens only), is one component of a comprehensive MRSA colonization surveillance program. It is not intended to diagnose MRSA infection nor to guide or monitor treatment for MRSA infections. Performed at [facility], [address] [place], [place] [address]   Culture, respiratory     Status: None   Collection Time: 04/02/19  4:50 PM   Specimen: SPU  Result Value Ref Range Status   Specimen Description   Final    SPUTUM Performed at [facility], [address] [place], [place] [address]    Special Requests   Final    NONE Performed at [facility], [address] [place], [place] [address]    Gram Stain   Final    ABUNDANT WBC PRESENT, PREDOMINANTLY PMN NO SQUAMOUS EPITHELIAL CELLS SEEN FEW GRAM POSITIVE COCCI IN CLUSTERS Performed at [facility], [place] [address], [place], [place] [address]    Culture FEW METHICILLIN  RESISTANT STAPHYLOCOCCUS AUREUS  Final   Report Status 04/05/2019 FINAL  Final   Organism ID, Bacteria METHICILLIN RESISTANT STAPHYLOCOCCUS AUREUS  Final      Susceptibility   Methicillin resistant staphylococcus aureus - MIC*    CIPROFLOXACIN >=8 RESISTANT Resistant     ERYTHROMYCIN >=8 RESISTANT Resistant     GENTAMICIN <=0.5 SENSITIVE Sensitive     OXACILLIN >=4 RESISTANT Resistant     TETRACYCLINE <=1 SENSITIVE Sensitive     VANCOMYCIN 1 SENSITIVE Sensitive     TRIMETH/SULFA >=320 RESISTANT Resistant  CLINDAMYCIN <=0.25 SENSITIVE Sensitive     RIFAMPIN <=0.5 SENSITIVE Sensitive     Inducible Clindamycin NEGATIVE Sensitive     * FEW METHICILLIN RESISTANT STAPHYLOCOCCUS AUREUS  C difficile quick scan w PCR reflex     Status: None   Collection Time: 04/04/19 12:54 PM   Specimen: STOOL  Result Value Ref Range Status   C Diff antigen NEGATIVE NEGATIVE Final   C Diff toxin NEGATIVE NEGATIVE Final   C Diff interpretation No C. difficile detected.  Final    Comment: Performed at [facility], [address] [place], [place] [address]  Culture, respiratory (non-expectorated)     Status: None   Collection Time: 04/06/19  5:27 PM   Specimen: Tracheal Aspirate; Respiratory  Result Value Ref Range Status   Specimen Description   Final    TRACHEAL ASPIRATE Performed at [facility], [address], [place], [place] [address]    Special Requests   Final    NONE Performed at [facility], [address] [place], [place] [address]    Gram Stain   Final    MODERATE WBC PRESENT, PREDOMINANTLY PMN ABUNDANT GRAM POSITIVE COCCI IN CLUSTERS Performed at [facility], [place] [address] [place], [place] [address]    Culture FEW METHICILLIN RESISTANT STAPHYLOCOCCUS AUREUS  Final   Report Status 04/08/2019 FINAL  Final   Organism ID, Bacteria METHICILLIN RESISTANT STAPHYLOCOCCUS AUREUS  Final      Susceptibility   Methicillin resistant staphylococcus  aureus - MIC*    CIPROFLOXACIN >=8 RESISTANT Resistant     ERYTHROMYCIN >=8 RESISTANT Resistant     GENTAMICIN <=0.5 SENSITIVE Sensitive     OXACILLIN >=4 RESISTANT Resistant     TETRACYCLINE <=1 SENSITIVE Sensitive     VANCOMYCIN 1 SENSITIVE Sensitive     TRIMETH/SULFA >=320 RESISTANT Resistant     CLINDAMYCIN <=0.25 SENSITIVE Sensitive     RIFAMPIN <=0.5 SENSITIVE Sensitive     Inducible Clindamycin NEGATIVE Sensitive     * FEW METHICILLIN RESISTANT STAPHYLOCOCCUS AUREUS    Best Practice/Protocols:  VTE Prophylaxis: Lovenox (prophylaxtic dose) GI Prophylaxis: Antihistamine Continous Sedation  Events: 9/12 admitted to ICU via ED, intubated and unresponsive 9/12CXR suspicious for right lobe CAP, Lactic acid elevated 9/12Possible seizure noted, CT head showed no intracranial abnormalities 9/13MRI brain showed no abnormalities 9/15Remains minimally responsive and intubated without sedation 9/15EEG showed severe encephalopathy with no seizure activity 9/16Significantly more responsive, following commands 9/16Extubated in ICU. Immediate copious emesis and aspiration occurred 9/16Re-intubated and bronchoscopy performed to clear aspirate +MRSA pneunomonia 9/18 recurrent fever on vancomycin, Unasyn added for potential anaerobes given aspiration, recultured 9/20 final micro pending, continued issues with agitation.  Restart home antidepressant/anxiolytic 9/21 - failed SBT due to hypoxemia and vomiting episode  Studies: Dg Abd 1 View  Result Date: 04/04/2019 CLINICAL DATA:  Nasogastric tube placement. EXAM: ABDOMEN - 1 VIEW COMPARISON:  One view abdomen 04/15/2019. FINDINGS: 1234 hours. Nasogastric tube projects over the mid lumbar spine, consistent with location in the mid stomach. No significant residual gastric distention. There is no dilated small bowel within the visualized upper abdomen. Patchy airspace opacities are present at both lung bases. These are better seen on  concurrent chest radiographs. IMPRESSION: Nasogastric tube projects to the level of the mid stomach. Electronically Signed   By: Richardean Sale M.D.   On: 04/04/2019 12:57   Dg Abdomen 1 View  Result Date: 04/16/2019 CLINICAL DATA:  Unresponsive.  NG tube placement EXAM: ABDOMEN - 1  VIEW COMPARISON:  None. FINDINGS: 1149 hours. Prominent gastric bubble noted. NG tube tip is in the mid stomach with proximal port of the NG tube below the EG junction. No gaseous bowel distention within the visualized abdomen. Visualized bony anatomy unremarkable. IMPRESSION: NG tube tip is in the mid stomach. Electronically Signed   By: Misty Stanley M.D.   On: 03/29/2019 12:25   Ct Head Wo Contrast  Result Date: 03/25/2019 CLINICAL DATA:  Altered level of consciousness. EXAM: CT HEAD WITHOUT CONTRAST TECHNIQUE: Contiguous axial images were obtained from the base of the skull through the vertex without intravenous contrast. COMPARISON:  None. FINDINGS: Brain: There is no evidence for acute hemorrhage, hydrocephalus, mass lesion, or abnormal extra-axial fluid collection. No definite CT evidence for acute infarction. Vascular: No hyperdense vessel or unexpected calcification. Skull: No evidence for fracture. No worrisome lytic or sclerotic lesion. Sinuses/Orbits: The visualized paranasal sinuses and mastoid air cells are clear. Visualized portions of the globes and intraorbital fat are unremarkable. Other: None. IMPRESSION: Unremarkable CT evaluation of the brain. No acute intracranial abnormality. Electronically Signed   By: Misty Stanley M.D.   On: 03/22/2019 12:46   Mr Brain Wo Contrast  Result Date: 04/01/2019 CLINICAL DATA:  Acute presentation unresponsive. EXAM: MRI HEAD WITHOUT CONTRAST TECHNIQUE: Multiplanar, multiecho pulse sequences of the brain and surrounding structures were obtained without intravenous contrast. COMPARISON:  Head CT 04/01/2019 FINDINGS: Brain: Diffusion imaging does not show any acute or  subacute infarction. Brainstem and cerebellum are normal. Cerebral hemispheres show moderate changes of chronic small vessel disease affecting the deep and subcortical white matter. No cortical or large vessel territory infarction. No mass lesion, hemorrhage, hydrocephalus or extra-axial collection. Vascular: Major vessels at the base of the brain show flow. Skull and upper cervical spine: Negative Sinuses/Orbits: Clear/normal Other: None IMPRESSION: No abnormality seen to explain the clinical presentation. No acute or subacute infarction or any reversible finding. Moderate chronic small-vessel ischemic changes the cerebral hemispheric white matter. Electronically Signed   By: Nelson Chimes M.D.   On: 04/01/2019 14:55   Mr Jeri Cos VH Contrast  Result Date: 04/03/2019 CLINICAL DATA:  In cephalopathy. EXAM: MRI HEAD WITHOUT AND WITH CONTRAST TECHNIQUE: Multiplanar, multiecho pulse sequences of the brain and surrounding structures were obtained without and with intravenous contrast. CONTRAST:  58m GADAVIST GADOBUTROL 1 MMOL/ML IV SOLN COMPARISON:  04/01/2019 FINDINGS: Brain: Diffusion imaging does not show any acute or subacute infarction. The brainstem and cerebellum are normal. Cerebral hemispheres show moderate chronic small-vessel ischemic changes of the white matter. No cortical or large vessel territory infarction. No mass lesion, hemorrhage, hydrocephalus or extra-axial collection. Vascular: Major vessels at the base of the brain show flow. Skull and upper cervical spine: Negative Sinuses/Orbits: Clear/normal. Other: Nasopharyngeal fluid related to intubation. IMPRESSION: No change. No acute intracranial finding. Moderate chronic small-vessel ischemic changes of the white matter. Electronically Signed   By: MNelson ChimesM.D.   On: 04/03/2019 18:58   Mr Cervical Spine W Wo Contrast  Result Date: 04/03/2019 CLINICAL DATA:  Myelopathy.  Weakness of the extremities. EXAM: MRI CERVICAL SPINE WITHOUT AND WITH  CONTRAST TECHNIQUE: Multiplanar and multiecho pulse sequences of the cervical spine, to include the craniocervical junction and cervicothoracic junction, were obtained without and with intravenous contrast. CONTRAST:  763mGADAVIST GADOBUTROL 1 MMOL/ML IV SOLN COMPARISON:  None. FINDINGS: Alignment: Normal Vertebrae: No fracture or primary bone lesion. Cord: No primary cord lesion.  See below regarding stenosis. Posterior Fossa, vertebral arteries, paraspinal tissues: Negative  Disc levels: No abnormality at foramen magnum, C1-2 or C2-3. C3-4: Uncovertebral hypertrophy and facet arthropathy on the left. Left foraminal stenosis could affect the C4 nerve. No central canal stenosis. C4-5: Endplate osteophytes and mild bulging of the disc. Facet arthropathy on the left. Mild left foraminal narrowing. C5-6: Broad-based, right posterolateral predominant disc herniation effaces the ventral subarachnoid space and indents the right side of the cord. Proximal foraminal encroachment on the right. Facet arthropathy on the right. Right foraminal narrowing could affect the C6 nerve. C6-7: Spondylosis with endplate osteophytes and shallow protrusion of the disc. Narrowing the ventral subarachnoid space but no compression of the cord. Bilateral foraminal narrowing. C7-T1: No disc pathology.  Mild facet hypertrophy. T1-2: Normal. No significant enhancing pathology. IMPRESSION: No primary cord pathology. C3-4: Left foraminal stenosis due to encroachment by uncovertebral osteophytes and facet arthropathy could affect the left C4 nerve. C4-5: Mild left foraminal narrowing due to uncovertebral prominence and facet degeneration, without definite compressive stenosis. C5-6: Broad-based, right posterolateral predominant disc herniation with effacement of the ventral subarachnoid space and slight deformity of the right side of the cord. Facet arthropathy on the right. Right foraminal stenosis could affect the right C6 nerve. C6-7: Chronic  degenerative spondylosis. Narrowing of the ventral subarachnoid space but no compression of the cord. Bilateral foraminal narrowing could affect either C7 nerve. Electronically Signed   By: Nelson Chimes M.D.   On: 04/03/2019 19:02   Dg Chest Port 1 View  Result Date: 09-May-2019 CLINICAL DATA:  History of pulmonary disease.  Intubation. EXAM: PORTABLE CHEST 1 VIEW COMPARISON:  04/09/2019. FINDINGS: Endotracheal tube and NG tube in stable position. Heart size normal. Diffuse bilateral pulmonary infiltrates again noted. Infiltrates have slightly progressed from prior exam no pleural effusion or pneumothorax. IMPRESSION: Endotracheal tube and NG tube in stable position. 2. Diffuse bilateral pulmonary infiltrates again noted. These have progressed slightly from prior exam. Electronically Signed   By: [place]   On: 05/09/19 11:15   Dg Chest Port 1 View  Result Date: 04/09/2019 CLINICAL DATA:  68 year old female with respiratory failure EXAM: PORTABLE CHEST 1 VIEW COMPARISON:  April 07, 2019, April 05, 2019 FINDINGS: Cardiomediastinal silhouette unchanged in size and contour. Unchanged endotracheal tube which terminates approximately 2.9 cm above the carina. Unchanged gastric tube terminating out of the field of view. Reticulonodular opacities of the bilateral lungs, improving from the plain film dated April 04, 2019 no pneumothorax. No pleural effusion. IMPRESSION: Bilateral reticulonodular opacities persist, improved dating to the plain films of April 04, 2019 Unchanged endotracheal tube and gastric tube Electronically Signed   By: Corrie Mckusick D.O.   On: 04/09/2019 08:01   Dg Chest Port 1 View  Result Date: 04/07/2019 CLINICAL DATA:  Acute respiratory distress.  Follow-up exam. EXAM: PORTABLE CHEST 1 VIEW COMPARISON:  04/05/2019 and earlier exams. FINDINGS: Right upper lobe airspace consolidation is significantly improved, particularly compared to 04/04/2019. Lungs show bilateral  interstitial prominence with patchy area airspace opacities more diffusely, the latter also improved the previous exam. No new lung abnormalities. No convincing pleural effusion and no pneumothorax. Endotracheal tube and nasal/orogastric tube are stable and well positioned. IMPRESSION: 1. Improved lung aeration compared to most recent prior exams. This is most evident in right upper lobe with decreased airspace lung opacity. 2. No new abnormalities.  Stable support apparatus. Electronically Signed   By: Lajean Manes M.D.   On: 04/07/2019 05:58   Dg Chest Port 1 View  Result Date: 04/05/2019 CLINICAL DATA:  Acute  respiratory failure EXAM: PORTABLE CHEST 1 VIEW COMPARISON:  04/04/2019 FINDINGS: Endotracheal tube and gastric catheter are again seen and stable. Cardiac shadow is stable. The lungs are well aerated bilaterally with patchy bilateral airspace opacities similar to that seen on the prior exam. No new focal infiltrate is seen. No sizable effusion is noted. IMPRESSION: Multifocal pneumonia stable from prior exam. Electronically Signed   By: Inez Catalina M.D.   On: 04/05/2019 03:52   Dg Chest Port 1 View  Result Date: 04/04/2019 CLINICAL DATA:  Multilobar pneumonia.  Intubated patient. EXAM: PORTABLE CHEST 1 VIEW COMPARISON:  Radiographs 04/03/2019 and 04/02/2019. FINDINGS: 1233 hours. Mild patient rotation to the right. Tip of the endotracheal tube is in the mid trachea. Enteric tube projects below the diaphragm, tip not visualized. The heart size and mediastinal contours are stable. Right upper lobe airspace disease appears unchanged. There is slight worsening of the bibasilar airspace opacities. There are possible small bilateral pleural effusions. No pneumothorax. The bones appear unchanged. IMPRESSION: 1. Stable position of the endotracheal and nasogastric tubes. 2. Worsening basilar components of bilateral airspace opacities consistent with multilobar pneumonia. Possible small bilateral pleural  effusions. Electronically Signed   By: Richardean Sale M.D.   On: 04/04/2019 12:59   Dg Chest Port 1 View  Result Date: 04/03/2019 CLINICAL DATA:  Evaluate for pneumonia. EXAM: PORTABLE CHEST 1 VIEW COMPARISON:  Chest radiograph 04/02/2019, 04/01/2019 FINDINGS: Stable cardiomediastinal contours. The endotracheal tip terminates between the thoracic inlet and carina. Nasogastric tube courses below the diaphragm with distal aspect out of field of view. No pneumothorax or pleural effusion. There are persistent diffuse right-sided heterogeneous pulmonary opacities. The left lung is clear. Visualized bony skeleton is unremarkable. IMPRESSION: Persistent diffuse right-sided heterogeneous pulmonary opacities suspicious for infection. Electronically Signed   By: Audie Pinto M.D.   On: 04/03/2019 15:36   Dg Chest Port 1 View  Result Date: 04/02/2019 CLINICAL DATA:  Respiratory failure. EXAM: PORTABLE CHEST 1 VIEW COMPARISON:  Radiograph of April 01, 2019. FINDINGS: Endotracheal and nasogastric tubes are unchanged in position. No pneumothorax is noted. No significant pleural effusion is noted. Left lung is clear. Stable right upper and lower lobe airspace opacities are noted concerning for pneumonia. Bony thorax is unremarkable. IMPRESSION: Stable right lung opacities are noted concerning for pneumonia. Stable support apparatus. Electronically Signed   By: Marijo Conception M.D.   On: 04/02/2019 07:46   Dg Chest Port 1 View  Result Date: 04/01/2019 CLINICAL DATA:  68 year old female with history of respiratory failure. EXAM: PORTABLE CHEST 1 VIEW COMPARISON:  Chest x-ray 03/24/2019. FINDINGS: An endotracheal tube is in place with tip 5.1 cm above the carina. A nasogastric tube is seen extending into the stomach, however, the tip of the nasogastric tube extends below the lower margin of the image. Patchy multifocal interstitial and airspace disease noted throughout the right lung, most confluent in the right  upper lobe, with slightly worsened aeration compared to yesterday's examination. Left lung is clear. No pleural effusions. No evidence of pulmonary edema. Heart size is normal. Upper mediastinal contours are within normal limits. IMPRESSION: 1. Severe multilobar pneumonia in the right lung, most pronounced in the right upper lobe, with worsening aeration compared to yesterday's examination. 2. Support apparatus, as above. Electronically Signed   By: Vinnie Langton M.D.   On: 04/01/2019 08:03   Dg Chest Port 1 View  Result Date: 04/09/2019 CLINICAL DATA:  Status post intubation and OG tube placement. EXAM: PORTABLE CHEST 1 VIEW COMPARISON:  No comparison studies available. FINDINGS: 1147 hours. Endotracheal tube tip is 5.2 cm above the base of the carina. The NG tube passes into the stomach although the distal tip position is not included on the film. Volume loss noted right hemithorax patchy airspace disease noted right upper lobe and to a lesser degree in the right mid lung. Left lung appears hyperexpanded but clear. Interstitial markings are diffusely coarsened with chronic features. The cardiopericardial silhouette is within normal limits for size. IMPRESSION: Volume loss right hemithorax with right upper and mid lung patchy airspace disease. Endotracheal tube and NG tube as above. Electronically Signed   By: Misty Stanley M.D.   On: 04/14/2019 12:23   [place] Ekg Site Rite  Result Date: 04/26/19 If Site Rite image not attached, placement could not be confirmed due to current cardiac rhythm.   Consults: Treatment Team:  Catarina Hartshorn, MD   Subjective:    Overnight Issues: Overnight became more agitated and required propofol.  Has been the same today did not tolerate SBT.  Note that she had been on Xanax as an outpatient and Paxil.  Objective:  Vital signs for last 24 hours: Temp:  [98.9 F (37.2 C)-100 F (37.8 C)] 99.4 F (37.4 C) (09/23 1200) Pulse Rate:  [81-124] 99 (09/23  1500) Resp:  [11-29] 20 (09/23 1500) BP: (94-180)/(39-74) 97/39 (09/23 1500) SpO2:  [89 %-100 %] 93 % (09/23 1500) FiO2 (%):  [40 %-50 %] 50 % (09/23 1200)  Hemodynamic parameters for last 24 hours:    Intake/Output from previous day: 09/22 0701 - 09/23 0700 In: 2295.9 [I.V.:1795.8; IV Piggyback:500.1] Out: 2225 [Urine:1725; Emesis/NG output:400; Stool:100]  Intake/Output this shift: Total I/O In: 779.2 [I.V.:534.1; IV Piggyback:245.1] Out: 135 [Urine:135]  Vent settings for last 24 hours: Vent Mode: PSV FiO2 (%):  [40 %-50 %] 50 % Set Rate:  [20 bmp] 20 bmp Vt Set:  [450 mL] 450 mL PEEP:  [5 cmH20] 5 cmH20 Pressure Support:  [5 cmH20-10 cmH20] 10 cmH20 Plateau Pressure:  [18 cmH20] 18 cmH20  Physical Exam:  Gen: Intubated, RASS -1 to -2, not F/C consistently, fidgeting in bed, agitated at times HEENT: NCAT, sclerae anicteric Neck: No LAN, no JVD noted, trachea midline Lungs: Scattered rhonchi, no wheezes.  Synchronous with vent unless agitated Cardiovascular: Regular, no M noted Abdomen: Soft, NT, +BS Ext: Warm, no edema Neuro: No overt deficit noted, she moves all 4 spontaneously.  Fidgety, not following commands reliably, impulsive Skin: No lesions noted  Assessment/Plan:  IMPRESSION: Acute hypoxemic respiratory failure RUL community-acquired pneumonia followed by aspiration pneumonia Severe sepsis, resolving Sinus tachycardia, resolved Altered mental status -sedation vs MRSA pna related    PLAN/REC: Cont vent support - settings reviewed and adjusted Cont vent bundle Daily SBT if/when meets criteria, failed SBT again this morning Monitor temp, WBC count Micro and abx: Most recent culture pending, initial cult. MRSA, on vancomycin and Unasyn Initiated TF protocol 09/13 DVT px: enoxaparin Monitor CBC intermittently [place] meeting today with family    LOS: 11 days   Additional comments: Care coordination with bedside nurse performed today.  Discussed with RT  as well.  Critical Care Total Time*: 32 Minutes    Ottie Glazier, M.D.  Pulmonary & Critical Care Medicine  [place] during the described time interval was provided by me and/or other providers on the critical care team.  I have reviewed this patient's available data, including medical history, events of note, physical examination and test results  as part of my evaluation.

## 2019-04-19 DEATH — deceased
